# Patient Record
Sex: Female | Born: 1935 | Race: White | Hispanic: No | Marital: Married | Smoking: Never smoker
Health system: Southern US, Community
[De-identification: ages and names within clinical notes are randomized; demographics above are authoritative.]

## PROBLEM LIST (undated history)

## (undated) ENCOUNTER — Emergency Department (HOSPITAL_COMMUNITY): Discharge status: Absent without leave | Payer: Medicare Other

## (undated) DIAGNOSIS — N183 Chronic kidney disease, stage 3 unspecified: Secondary | ICD-10-CM

## (undated) DIAGNOSIS — C50912 Malignant neoplasm of unspecified site of left female breast: Secondary | ICD-10-CM

## (undated) DIAGNOSIS — R911 Solitary pulmonary nodule: Secondary | ICD-10-CM

## (undated) DIAGNOSIS — D472 Monoclonal gammopathy: Secondary | ICD-10-CM

## (undated) DIAGNOSIS — E1165 Type 2 diabetes mellitus with hyperglycemia: Secondary | ICD-10-CM

## (undated) DIAGNOSIS — M4800 Spinal stenosis, site unspecified: Secondary | ICD-10-CM

## (undated) DIAGNOSIS — K449 Diaphragmatic hernia without obstruction or gangrene: Secondary | ICD-10-CM

## (undated) DIAGNOSIS — I679 Cerebrovascular disease, unspecified: Secondary | ICD-10-CM

## (undated) DIAGNOSIS — I10 Essential (primary) hypertension: Secondary | ICD-10-CM

## (undated) DIAGNOSIS — K922 Gastrointestinal hemorrhage, unspecified: Secondary | ICD-10-CM

## (undated) DIAGNOSIS — I639 Cerebral infarction, unspecified: Secondary | ICD-10-CM

## (undated) DIAGNOSIS — M889 Osteitis deformans of unspecified bone: Secondary | ICD-10-CM

## (undated) DIAGNOSIS — D649 Anemia, unspecified: Secondary | ICD-10-CM

## (undated) DIAGNOSIS — M549 Dorsalgia, unspecified: Secondary | ICD-10-CM

## (undated) DIAGNOSIS — D509 Iron deficiency anemia, unspecified: Secondary | ICD-10-CM

## (undated) DIAGNOSIS — I4819 Other persistent atrial fibrillation: Secondary | ICD-10-CM

## (undated) DIAGNOSIS — IMO0002 Reserved for concepts with insufficient information to code with codable children: Secondary | ICD-10-CM

## (undated) DIAGNOSIS — K529 Noninfective gastroenteritis and colitis, unspecified: Secondary | ICD-10-CM

## (undated) HISTORY — PX: HEMORRHOID SURGERY: SHX153

## (undated) HISTORY — DX: Other persistent atrial fibrillation: I48.19

## (undated) HISTORY — PX: PARTIAL HYSTERECTOMY: SHX80

## (undated) HISTORY — DX: Spinal stenosis, site unspecified: M48.00

## (undated) HISTORY — DX: Chronic kidney disease, stage 3 (moderate): N18.3

## (undated) HISTORY — DX: Chronic kidney disease, stage 3 unspecified: N18.30

## (undated) HISTORY — DX: Cerebral infarction, unspecified: I63.9

## (undated) HISTORY — PX: BREAST SURGERY: SHX581

## (undated) HISTORY — PX: CHOLECYSTECTOMY: SHX55

---

## 1997-11-30 ENCOUNTER — Other Ambulatory Visit: Admission: RE | Admit: 1997-11-30 | Discharge: 1997-11-30 | Payer: Self-pay | Admitting: Obstetrics and Gynecology

## 1998-05-18 ENCOUNTER — Ambulatory Visit (HOSPITAL_COMMUNITY): Admission: RE | Admit: 1998-05-18 | Discharge: 1998-05-19 | Payer: Self-pay | Admitting: General Surgery

## 1998-05-18 ENCOUNTER — Encounter: Payer: Self-pay | Admitting: General Surgery

## 2000-12-04 ENCOUNTER — Other Ambulatory Visit: Admission: RE | Admit: 2000-12-04 | Discharge: 2000-12-04 | Payer: Self-pay | Admitting: Obstetrics and Gynecology

## 2001-06-04 ENCOUNTER — Emergency Department (HOSPITAL_COMMUNITY): Admission: EM | Admit: 2001-06-04 | Discharge: 2001-06-04 | Payer: Self-pay | Admitting: Emergency Medicine

## 2001-12-02 ENCOUNTER — Encounter: Admission: RE | Admit: 2001-12-02 | Discharge: 2002-03-02 | Payer: Self-pay | Admitting: Internal Medicine

## 2001-12-07 ENCOUNTER — Other Ambulatory Visit: Admission: RE | Admit: 2001-12-07 | Discharge: 2001-12-07 | Payer: Self-pay | Admitting: Obstetrics and Gynecology

## 2002-01-18 ENCOUNTER — Encounter: Admission: RE | Admit: 2002-01-18 | Discharge: 2002-01-18 | Payer: Self-pay | Admitting: Gastroenterology

## 2002-01-18 ENCOUNTER — Encounter: Payer: Self-pay | Admitting: Gastroenterology

## 2002-12-24 ENCOUNTER — Other Ambulatory Visit: Admission: RE | Admit: 2002-12-24 | Discharge: 2002-12-24 | Payer: Self-pay | Admitting: Obstetrics and Gynecology

## 2005-02-20 ENCOUNTER — Other Ambulatory Visit: Admission: RE | Admit: 2005-02-20 | Discharge: 2005-02-20 | Payer: Self-pay | Admitting: Obstetrics and Gynecology

## 2007-02-26 ENCOUNTER — Emergency Department (HOSPITAL_COMMUNITY): Admission: EM | Admit: 2007-02-26 | Discharge: 2007-02-26 | Payer: Self-pay | Admitting: Emergency Medicine

## 2007-03-12 ENCOUNTER — Emergency Department (HOSPITAL_COMMUNITY): Admission: EM | Admit: 2007-03-12 | Discharge: 2007-03-12 | Payer: Self-pay | Admitting: Emergency Medicine

## 2007-04-22 ENCOUNTER — Ambulatory Visit (HOSPITAL_COMMUNITY): Payer: Self-pay | Admitting: Psychiatry

## 2007-05-20 ENCOUNTER — Ambulatory Visit (HOSPITAL_COMMUNITY): Payer: Self-pay | Admitting: Psychiatry

## 2008-01-22 DIAGNOSIS — C50912 Malignant neoplasm of unspecified site of left female breast: Secondary | ICD-10-CM

## 2008-01-22 HISTORY — DX: Malignant neoplasm of unspecified site of left female breast: C50.912

## 2008-10-24 ENCOUNTER — Observation Stay (HOSPITAL_COMMUNITY): Admission: EM | Admit: 2008-10-24 | Discharge: 2008-10-28 | Payer: Self-pay | Admitting: Emergency Medicine

## 2009-02-15 ENCOUNTER — Inpatient Hospital Stay (HOSPITAL_COMMUNITY): Admission: EM | Admit: 2009-02-15 | Discharge: 2009-02-17 | Payer: Self-pay | Admitting: Emergency Medicine

## 2009-07-27 ENCOUNTER — Other Ambulatory Visit: Admission: RE | Admit: 2009-07-27 | Discharge: 2009-07-27 | Payer: Self-pay | Admitting: Obstetrics and Gynecology

## 2010-04-08 LAB — COMPREHENSIVE METABOLIC PANEL
ALT: 12 U/L (ref 0–35)
AST: 20 U/L (ref 0–37)
Albumin: 3.3 g/dL — ABNORMAL LOW (ref 3.5–5.2)
CO2: 25 mEq/L (ref 19–32)
Chloride: 105 mEq/L (ref 96–112)
Creatinine, Ser: 1.03 mg/dL (ref 0.4–1.2)
GFR calc Af Amer: >60 mL/min (ref >60)
Potassium: 3.1 mEq/L — ABNORMAL LOW (ref 3.5–5.1)
Sodium: 138 mEq/L (ref 135–145)

## 2010-04-08 LAB — RAPID URINE DRUG SCREEN, HOSP PERFORMED
Amphetamines: NOT DETECTED
Barbiturates: NOT DETECTED
Benzodiazepines: NOT DETECTED
Opiates: NOT DETECTED

## 2010-04-08 LAB — BASIC METABOLIC PANEL
BUN: 17 mg/dL (ref 6–23)
GFR calc non Af Amer: 54 mL/min — ABNORMAL LOW (ref >60)
Glucose, Bld: 100 mg/dL — ABNORMAL HIGH (ref 70–99)
Potassium: 3.5 mEq/L (ref 3.5–5.1)

## 2010-04-08 LAB — CBC
MCV: 94.3 fL (ref 78.0–100.0)
Platelets: 129 10*3/uL — ABNORMAL LOW (ref 150–400)
RBC: 3.97 MIL/uL (ref 3.87–5.11)
WBC: 5.8 10*3/uL (ref 4.0–10.5)

## 2010-04-08 LAB — DIFFERENTIAL
Basophils Relative: 0 % (ref 0–1)
Lymphocytes Relative: 13 % (ref 12–46)
Lymphs Abs: 0.7 10*3/uL (ref 0.7–4.0)
Monocytes Relative: 3 % (ref 3–12)
Neutro Abs: 4.9 10*3/uL (ref 1.7–7.7)
Neutrophils Relative %: 83 % — ABNORMAL HIGH (ref 43–77)

## 2010-04-08 LAB — ACETAMINOPHEN LEVEL: Acetaminophen (Tylenol), Serum: <10 ug/mL — ABNORMAL LOW (ref 10–30)

## 2010-04-26 LAB — TSH: TSH: 5.026 u[IU]/mL — ABNORMAL HIGH (ref 0.350–4.500)

## 2010-04-26 LAB — CLOSTRIDIUM DIFFICILE EIA: C difficile Toxins A+B, EIA: NEGATIVE

## 2010-04-26 LAB — FECAL FAT, QUALITATIVE: Neutral Fat: NORMAL

## 2010-04-26 LAB — STOOL CULTURE

## 2010-04-26 LAB — BASIC METABOLIC PANEL
BUN: 19 mg/dL (ref 6–23)
Chloride: 110 mEq/L (ref 96–112)
Creatinine, Ser: 1.16 mg/dL (ref 0.4–1.2)
Glucose, Bld: 91 mg/dL (ref 70–99)

## 2010-04-26 LAB — CBC
HCT: 42.5 % (ref 36.0–46.0)
Hemoglobin: 14.2 g/dL (ref 12.0–15.0)
MCHC: 33.6 g/dL (ref 30.0–36.0)
MCV: 93.9 fL (ref 78.0–100.0)
Platelets: 157 10*3/uL (ref 150–400)
RDW: 13.9 % (ref 11.5–15.5)
RDW: 14 % (ref 11.5–15.5)
WBC: 7.8 10*3/uL (ref 4.0–10.5)

## 2010-04-26 LAB — DIFFERENTIAL
Basophils Absolute: 0.1 10*3/uL (ref 0.0–0.1)
Basophils Relative: 1 % (ref 0–1)
Neutro Abs: 3.6 10*3/uL (ref 1.7–7.7)
Neutrophils Relative %: 62 % (ref 43–77)

## 2010-04-26 LAB — PROTIME-INR
INR: 1 (ref 0.00–1.49)
Prothrombin Time: 13.2 seconds (ref 11.6–15.2)

## 2010-04-26 LAB — COMPREHENSIVE METABOLIC PANEL
Alkaline Phosphatase: 201 U/L — ABNORMAL HIGH (ref 39–117)
BUN: 22 mg/dL (ref 6–23)
CO2: 25 mEq/L (ref 19–32)
Chloride: 105 mEq/L (ref 96–112)
GFR calc non Af Amer: 56 mL/min — ABNORMAL LOW (ref >60)
Glucose, Bld: 94 mg/dL (ref 70–99)
Potassium: 3.2 mEq/L — ABNORMAL LOW (ref 3.5–5.1)
Total Bilirubin: 0.9 mg/dL (ref 0.3–1.2)
Total Protein: 7.4 g/dL (ref 6.0–8.3)

## 2010-04-26 LAB — MAGNESIUM: Magnesium: 2.3 mg/dL (ref 1.5–2.5)

## 2010-04-26 LAB — LIPASE, BLOOD: Lipase: 30 U/L (ref 11–59)

## 2010-04-26 LAB — CK TOTAL AND CKMB (NOT AT ARMC): Total CK: 43 U/L (ref 7–177)

## 2010-10-12 LAB — BASIC METABOLIC PANEL
BUN: 22
CO2: 25
Calcium: 9.3
Chloride: 104
Creatinine, Ser: 1.26 — ABNORMAL HIGH
GFR calc non Af Amer: 42 — ABNORMAL LOW
Glucose, Bld: 144 — ABNORMAL HIGH
Glucose, Bld: 198 — ABNORMAL HIGH
Potassium: 3.4 — ABNORMAL LOW
Sodium: 139
Sodium: 142

## 2010-10-12 LAB — CBC
HCT: 41.8
Hemoglobin: 13.3
Hemoglobin: 12.8
MCHC: 31.8
MCHC: 34.6
MCV: 90.6
Platelets: 198
Platelets: 213
RBC: 4.62
RDW: 13.2
RDW: 14.3
WBC: 9.6

## 2010-10-12 LAB — URINE CULTURE: Colony Count: 55000

## 2010-10-12 LAB — DIFFERENTIAL
Basophils Absolute: 0
Basophils Absolute: 0.1
Basophils Relative: 1
Basophils Relative: 2 — ABNORMAL HIGH
Eosinophils Absolute: 0.2
Eosinophils Relative: 3
Lymphocytes Relative: 19
Lymphocytes Relative: 19
Lymphs Abs: 1.8
Monocytes Absolute: 0.7
Monocytes Relative: 7
Neutro Abs: 6.9
Neutro Abs: 6.3
Neutrophils Relative %: 71
Neutrophils Relative %: 70

## 2010-10-12 LAB — URINE MICROSCOPIC-ADD ON

## 2010-10-12 LAB — URINALYSIS, ROUTINE W REFLEX MICROSCOPIC
Bilirubin Urine: NEGATIVE
Glucose, UA: NEGATIVE
Hgb urine dipstick: NEGATIVE
Ketones, ur: NEGATIVE
Ketones, ur: NEGATIVE
Nitrite: NEGATIVE
Protein, ur: NEGATIVE
Protein, ur: NEGATIVE
Specific Gravity, Urine: 1.017
Urobilinogen, UA: 0.2
Urobilinogen, UA: 0.2
pH: 7

## 2010-10-12 LAB — RAPID URINE DRUG SCREEN, HOSP PERFORMED
Amphetamines: NOT DETECTED
Cocaine: NOT DETECTED
Tetrahydrocannabinol: NOT DETECTED

## 2010-10-12 LAB — ACETAMINOPHEN LEVEL: Acetaminophen (Tylenol), Serum: <10 — ABNORMAL LOW

## 2010-10-12 LAB — SALICYLATE LEVEL: Salicylate Lvl: <4

## 2010-10-12 LAB — POCT CARDIAC MARKERS: Troponin i, poc: <0.05

## 2010-10-12 LAB — BASIC METABOLIC PANEL WITH GFR
BUN: 21
Calcium: 9.2
Creatinine, Ser: 1.13
GFR calc Af Amer: 57 — ABNORMAL LOW
GFR calc non Af Amer: 47 — ABNORMAL LOW

## 2011-07-18 ENCOUNTER — Other Ambulatory Visit: Payer: Self-pay | Admitting: Nurse Practitioner

## 2013-03-25 ENCOUNTER — Other Ambulatory Visit: Payer: Self-pay | Admitting: Nurse Practitioner

## 2013-11-21 DIAGNOSIS — K922 Gastrointestinal hemorrhage, unspecified: Secondary | ICD-10-CM

## 2013-11-21 HISTORY — DX: Gastrointestinal hemorrhage, unspecified: K92.2

## 2013-12-09 ENCOUNTER — Inpatient Hospital Stay (HOSPITAL_COMMUNITY)
Admission: EM | Admit: 2013-12-09 | Discharge: 2013-12-11 | DRG: 378 | Disposition: A | Discharge status: Home / Self Care | Payer: Medicare Other | Attending: Internal Medicine | Admitting: Internal Medicine

## 2013-12-09 ENCOUNTER — Encounter (HOSPITAL_COMMUNITY): Payer: Self-pay

## 2013-12-09 ENCOUNTER — Emergency Department (HOSPITAL_COMMUNITY): Payer: Medicare Other

## 2013-12-09 DIAGNOSIS — M549 Dorsalgia, unspecified: Secondary | ICD-10-CM | POA: Diagnosis present

## 2013-12-09 DIAGNOSIS — R911 Solitary pulmonary nodule: Secondary | ICD-10-CM | POA: Diagnosis present

## 2013-12-09 DIAGNOSIS — D500 Iron deficiency anemia secondary to blood loss (chronic): Secondary | ICD-10-CM | POA: Insufficient documentation

## 2013-12-09 DIAGNOSIS — K5730 Diverticulosis of large intestine without perforation or abscess without bleeding: Secondary | ICD-10-CM | POA: Diagnosis present

## 2013-12-09 DIAGNOSIS — K449 Diaphragmatic hernia without obstruction or gangrene: Secondary | ICD-10-CM | POA: Diagnosis present

## 2013-12-09 DIAGNOSIS — D62 Acute posthemorrhagic anemia: Secondary | ICD-10-CM | POA: Diagnosis present

## 2013-12-09 DIAGNOSIS — K922 Gastrointestinal hemorrhage, unspecified: Principal | ICD-10-CM | POA: Diagnosis present

## 2013-12-09 DIAGNOSIS — I10 Essential (primary) hypertension: Secondary | ICD-10-CM | POA: Diagnosis present

## 2013-12-09 DIAGNOSIS — E119 Type 2 diabetes mellitus without complications: Secondary | ICD-10-CM | POA: Diagnosis present

## 2013-12-09 DIAGNOSIS — K529 Noninfective gastroenteritis and colitis, unspecified: Secondary | ICD-10-CM | POA: Diagnosis present

## 2013-12-09 DIAGNOSIS — G8929 Other chronic pain: Secondary | ICD-10-CM | POA: Diagnosis present

## 2013-12-09 DIAGNOSIS — D649 Anemia, unspecified: Secondary | ICD-10-CM | POA: Diagnosis present

## 2013-12-09 HISTORY — DX: Dorsalgia, unspecified: M54.9

## 2013-12-09 HISTORY — DX: Type 2 diabetes mellitus with hyperglycemia: E11.65

## 2013-12-09 HISTORY — DX: Essential (primary) hypertension: I10

## 2013-12-09 HISTORY — DX: Reserved for concepts with insufficient information to code with codable children: IMO0002

## 2013-12-09 HISTORY — DX: Diaphragmatic hernia without obstruction or gangrene: K44.9

## 2013-12-09 HISTORY — DX: Noninfective gastroenteritis and colitis, unspecified: K52.9

## 2013-12-09 HISTORY — DX: Malignant neoplasm of unspecified site of left female breast: C50.912

## 2013-12-09 LAB — CBC
HEMATOCRIT: 17.7 % — AB (ref 36.0–46.0)
Hemoglobin: 5.1 g/dL — CL (ref 12.0–15.0)
MCH: 21.9 pg — AB (ref 26.0–34.0)
MCHC: 28.8 g/dL — ABNORMAL LOW (ref 30.0–36.0)
MCV: 76 fL — AB (ref 78.0–100.0)
Platelets: 291 10*3/uL (ref 150–400)
RBC: 2.33 MIL/uL — AB (ref 3.87–5.11)
RDW: 18.2 % — ABNORMAL HIGH (ref 11.5–15.5)
WBC: 6.2 10*3/uL (ref 4.0–10.5)

## 2013-12-09 LAB — PREPARE RBC (CROSSMATCH)

## 2013-12-09 LAB — COMPREHENSIVE METABOLIC PANEL
ALT: 10 U/L (ref 0–35)
ANION GAP: 13 (ref 5–15)
AST: 12 U/L (ref 0–37)
Albumin: 3.2 g/dL — ABNORMAL LOW (ref 3.5–5.2)
Alkaline Phosphatase: 104 U/L (ref 39–117)
BUN: 27 mg/dL — AB (ref 6–23)
CALCIUM: 9 mg/dL (ref 8.4–10.5)
CHLORIDE: 102 meq/L (ref 96–112)
CO2: 24 meq/L (ref 19–32)
CREATININE: 1.25 mg/dL — AB (ref 0.50–1.10)
GFR calc Af Amer: 46 mL/min — ABNORMAL LOW (ref >90)
GFR, EST NON AFRICAN AMERICAN: 40 mL/min — AB (ref >90)
Glucose, Bld: 116 mg/dL — ABNORMAL HIGH (ref 70–99)
Potassium: 3.7 mEq/L (ref 3.7–5.3)
Sodium: 139 mEq/L (ref 137–147)
Total Protein: 6.5 g/dL (ref 6.0–8.3)

## 2013-12-09 LAB — I-STAT TROPONIN, ED: TROPONIN I, POC: 0 ng/mL (ref 0.00–0.08)

## 2013-12-09 LAB — ABO/RH: ABO/RH(D): B POS

## 2013-12-09 LAB — GLUCOSE, CAPILLARY: Glucose-Capillary: 132 mg/dL — ABNORMAL HIGH (ref 70–99)

## 2013-12-09 LAB — POC OCCULT BLOOD, ED
FECAL OCCULT BLD: NEGATIVE
Fecal Occult Bld: NEGATIVE

## 2013-12-09 MED ORDER — ONDANSETRON HCL 4 MG PO TABS: 4.0000 mg | ORAL_TABLET | Freq: Four times a day (QID) | ORAL | Status: DC | PRN

## 2013-12-09 MED ORDER — SODIUM CHLORIDE 0.9 % IV SOLN: 10.0000 mL/h | Freq: Once | INTRAVENOUS | Status: AC

## 2013-12-09 MED ORDER — INSULIN ASPART 100 UNIT/ML ~~LOC~~ SOLN
0.0000 [IU] | Freq: Three times a day (TID) | SUBCUTANEOUS | Status: DC
  Administered 2013-12-11: 1 [IU] via SUBCUTANEOUS

## 2013-12-09 MED ORDER — SODIUM CHLORIDE 0.9 % IV SOLN: 10.0000 mL/h | Freq: Once | INTRAVENOUS | Status: DC

## 2013-12-09 MED ORDER — SODIUM CHLORIDE 0.9 % IJ SOLN
3.0000 mL | Freq: Two times a day (BID) | INTRAMUSCULAR | Status: DC
  Administered 2013-12-10 – 2013-12-11 (×2): 3 mL via INTRAVENOUS

## 2013-12-09 MED ORDER — HYDROCODONE-ACETAMINOPHEN 5-325 MG PO TABS: 1.0000 | ORAL_TABLET | ORAL | Status: DC | PRN

## 2013-12-09 MED ORDER — LOSARTAN POTASSIUM 50 MG PO TABS
100.0000 mg | ORAL_TABLET | Freq: Every day | ORAL | Status: DC
  Administered 2013-12-10 – 2013-12-11 (×2): 100 mg via ORAL
  Filled 2013-12-09 (×2): qty 2

## 2013-12-09 MED ORDER — SODIUM CHLORIDE 0.9 % IV SOLN
INTRAVENOUS | Status: AC
  Administered 2013-12-10: 04:00:00 via INTRAVENOUS

## 2013-12-09 MED ORDER — PANTOPRAZOLE SODIUM 40 MG IV SOLR
40.0000 mg | Freq: Two times a day (BID) | INTRAVENOUS | Status: DC
  Administered 2013-12-09 – 2013-12-10 (×2): 40 mg via INTRAVENOUS
  Filled 2013-12-09 (×4): qty 40

## 2013-12-09 MED ORDER — SODIUM CHLORIDE 0.9 % IV SOLN
20.0000 mL | INTRAVENOUS | Status: DC
  Administered 2013-12-09: 20 mL via INTRAVENOUS

## 2013-12-09 MED ORDER — ALBUTEROL SULFATE (2.5 MG/3ML) 0.083% IN NEBU: 2.5000 mg | INHALATION_SOLUTION | RESPIRATORY_TRACT | Status: DC | PRN

## 2013-12-09 MED ORDER — ONDANSETRON HCL 4 MG/2ML IJ SOLN: 4.0000 mg | Freq: Four times a day (QID) | INTRAMUSCULAR | Status: DC | PRN

## 2013-12-09 MED ORDER — ACETAMINOPHEN 650 MG RE SUPP: 650.0000 mg | Freq: Four times a day (QID) | RECTAL | Status: DC | PRN

## 2013-12-09 MED ORDER — ACETAMINOPHEN 325 MG PO TABS: 650.0000 mg | ORAL_TABLET | Freq: Four times a day (QID) | ORAL | Status: DC | PRN

## 2013-12-09 NOTE — H&P (Signed)
History and Physical  Kendra Maldonado VOH:[id number] DOB: September 20, 1933 DOA: 12/09/2013  Referring physician: Dr. Quintella Reichert, EDP PCP: Irven Shelling, MD  Outpatient Specialists:  1. GI: Dr. Teena Irani  Chief Complaint: Dizziness, lightheadedness and black stools.  HPI: Kendra Maldonado is a 80 y.o. female with history of DM 2, HTN, EGD confirmed hiatal hernia, chronic diarrhea post cholecystectomy, back pain-status post recent injection, presented to ED from PCPs office with above complaints. Patient states that she's been having severe mid back pain for which she underwent spinal steroid injection approximately a month ago. Since then she has noticed gradual onset and progressively worsening intermittent dizziness and lightheadedness. The symptoms seem to peak yesterday when she almost passed out but was able to hold onto her came can sit down on a chair. She denies chest pain, dyspnea or strokelike symptoms. She gives one week history of black tarry stools without red blood. She states that she did not make much of it and did not think that it was blood and was more concerned about her relentless back pain. She denies NSAID use. Today she called life alert who took her to PCPs office where hemoglobin was told to be low and she was advised to come to the ED. In the ED, hemoglobin 5.1, MCV 76, BUN*27, creatinine 1.26 and rectal exam by EDP showed empty rectal vault and FOBT negative. She was started on blood transfusion and hospitalist admission was requested. Patient states that she had a negative colonoscopy in 2011.    Review of Systems: All systems reviewed and apart from history of presenting illness, are negative.  Past Medical History  Diagnosis Date  . Diabetes mellitus type 2, uncontrolled   . Essential hypertension   . Chronic diarrhea   . Back pain   . Hiatal hernia    History reviewed. No pertinent past surgical history. Social History:  reports that she has never  smoked. She has never used smokeless tobacco. She reports that she does not drink alcohol or use illicit drugs. Widowed. Lives alone and ambulates with a cane. Her children closely supervise her.  Not on File  Family History  Problem Relation Age of Onset  . Hypertension Mother     Prior to Admission medications   Medication Sig Start Date End Date Taking? Authorizing Provider  CALCIUM PO Take 1 tablet by mouth daily.   Yes Historical Provider, MD  Cholecalciferol (D 2000) 2000 UNITS TABS Take 2,000 Units by mouth daily.   Yes Historical Provider, MD  diphenoxylate-atropine (LOMOTIL) 2.5-0.025 MG per tablet Take 2 tablets by mouth daily as needed for diarrhea or loose stools.  09/20/13  Yes Historical Provider, MD  FLUZONE HIGH-DOSE 0.5 ML SUSY Inject 1 application into the muscle once. 10/26/13  Yes Historical Provider, MD  hydrochlorothiazide (HYDRODIURIL) 25 MG tablet Take 25 mg by mouth daily. 11/10/13  Yes Historical Provider, MD  HYDROcodone-acetaminophen (NORCO/VICODIN) 5-325 MG per tablet Take 1 tablet by mouth every 6 (six) hours as needed. 12/02/13  Yes Historical Provider, MD  losartan (COZAAR) 100 MG tablet Take 100 mg by mouth daily. 10/21/13  Yes Historical Provider, MD  metFORMIN (GLUCOPHAGE) 500 MG tablet Take 500 mg by mouth 2 (two) times daily. 10/07/13  Yes Historical Provider, MD  omeprazole (PRILOSEC) 20 MG capsule Take 20 mg by mouth daily.   Yes Historical Provider, MD   Physical Exam: Filed Vitals:   12/09/13 1700 12/09/13 1715 12/09/13 1730 12/09/13 1744  BP: 145/64 131/57 148/67  Pulse: 85 96 81   Temp:    98.1 F (36.7 C)  TempSrc:    Oral  Resp: 19 21 20    SpO2: 99% 100% 100%      General exam: Moderately built and nourished pleasant elderly female patient, lying comfortably supine on the gurney in no obvious distress. Mucosa pale.   Head, eyes and ENT: Nontraumatic and normocephalic. Pupils equally reacting to light and accommodation. Oral mucosa  moist.  Neck: Supple. No JVD, carotid bruit or thyromegaly.  Lymphatics: No lymphadenopathy.  Respiratory system: Clear to auscultation. No increased work of breathing.  Cardiovascular system: S1 and S2 heard, RRR. No JVD, murmurs, gallops, clicks or pedal edema.  Gastrointestinal system: Abdomen is nondistended, soft and nontender. Normal bowel sounds heard. No organomegaly or masses appreciated.  Central nervous system: Alert and oriented. No focal neurological deficits.  Extremities: Symmetric 5 x 5 power. Peripheral pulses symmetrically felt.   Skin: No rashes or acute findings.  Musculoskeletal system: Negative exam.  Psychiatry: Pleasant and cooperative.   Labs on Admission:  Basic Metabolic Panel:  Recent Labs Lab 12/09/13 1520  NA 139  K 3.7  CL 102  CO2 24  GLUCOSE 116*  BUN 27*  CREATININE 1.25*  CALCIUM 9.0   Liver Function Tests:  Recent Labs Lab 12/09/13 1520  AST 12  ALT 10  ALKPHOS 104  BILITOT <0.2*  PROT 6.5  ALBUMIN 3.2*   No results for input(s): LIPASE, AMYLASE in the last 168 hours. No results for input(s): AMMONIA in the last 168 hours. CBC:  Recent Labs Lab 12/09/13 1520  WBC 6.2  HGB 5.1*  HCT 17.7*  MCV 76.0*  PLT 291   Cardiac Enzymes: No results for input(s): CKTOTAL, CKMB, CKMBINDEX, TROPONINI in the last 168 hours.  BNP (last 3 results) No results for input(s): PROBNP in the last 8760 hours. CBG: No results for input(s): GLUCAP in the last 168 hours.  Radiological Exams on Admission: Dg Chest Portable 1 View  12/09/2013   CLINICAL DATA:  Dizziness.  EXAM: PORTABLE CHEST - 1 VIEW  COMPARISON:  10/24/2008  FINDINGS: Heart size is within normal limits. Both lungs are clear. Possible hiatal hernia noted. Surgical clips again seen in the left axilla.  IMPRESSION: No active cardiopulmonary disease.  Question hiatal hernia.   Electronically Signed   By: Earle Gell M.D.   On: 12/09/2013 16:40    EKG: Independently  reviewed. Sinus rhythm without acute changes   Assessment/Plan Principal Problem:   Acute posthemorrhagic anemia Active Problems:   Upper GI bleed   Back pain   Chronic diarrhea   1. Acute posthemorrhagic anemia: Secondary to upper GI bleed. Transfuse 3 units of PRBCs and follow posttransfusion CBCs. Aim to keep hemoglobin >8 g per DL. Unfortunately transfusion was started before anemia panel was drawn. 2. Upper GI bleed: DD-rule out PUD, gastritis, esophagitis, AVM versus other etiology. Place on clear liquids and nothing by mouth after midnight for EGD in a.m. IV PPI twice a day. No history of NSAID use. [facility]/Dr. Arta Silence has been consulted and upon review of office notes, she has had EGD and colonoscopy in 2011. He agrees with plan of care. Patient is hemodynamically stable.  3. Back pain: Follows with neurosurgery/orthopedics as outpatient and is supposed to have MRI in the next couple of days for further evaluation. Pain management.  4. Hypertension: Controlled. Hold diuretics but continue ARB and monitor closely. If she becomes hypotensive, we'll have to hold all  antihypertensives. 5. Type II DM: Hold oral hypoglycemics and place on NovoLog SSI. 6. Chronic diarrhea: States that she has had this since cholecystectomy in the 80s and is unchanged.    Code Status: Full   Family Communication: Discussed with son at bedside. Disposition Plan: Home when medically stable   Time spent: 70 minutes   Virda Betters, MD, FACP, FHM. [facility] Pager [phone]  If 7PM-7AM, please contact night-coverage [email or web address] Password [email or web address] 12/09/2013, 5:54 PM

## 2013-12-09 NOTE — ED Provider Notes (Signed)
CSN: [id number]     Arrival date & time 12/09/13  1321 History   First MD Initiated Contact with Patient 12/09/13 1336     Chief Complaint  Patient presents with  . Dizziness      HPI Kendra Maldonado presents for progressive intermittent dizziness that spent ongoing for the last month. She had been attributing this pain to her spinal stenosis and recent steroid injection in her back, but when she went to see her PCP today she has told her hemoglobin was low and told to present to the emergency department. She states she's had one week of black stools. She has chronic diarrhea which is unchanged. She denies chest pain, shortness of breath, fevers, or vomiting. She has intermittent lower leg swelling is worse at the end of the day.  She had a colonoscopy several years ago. She denies history of GI bleeding. She has mild lower abdominal pain. Symptoms are mild, intermittent, and worsening. The dizziness is described as a weakness and fatigue. She denies any focal weakness or numbness.  No past medical history on file. History reviewed. No pertinent past surgical history. No family history on file. History  Substance Use Topics  . Smoking status: Never Smoker   . Smokeless tobacco: Not on file  . Alcohol Use: No   OB History    No data available     Review of Systems  All other systems reviewed and are negative.     Allergies  Review of patient's allergies indicates not on file.  Home Medications   Prior to Admission medications   Not on File   BP 141/81 mmHg  Pulse 81  Temp(Src) 97.8 F (36.6 C) (Oral)  Resp 20  SpO2 100% Physical Exam  Constitutional: She is oriented to person, place, and time. She appears well-developed and well-nourished.  HENT:  Head: Normocephalic and atraumatic.  Cardiovascular: Normal rate and regular rhythm.   No murmur heard. Pulmonary/Chest: Effort normal and breath sounds normal. No respiratory distress.  Abdominal: Soft. There is no  tenderness. There is no rebound and no guarding.  External hemorrhoids. Empty rectal vault.  Musculoskeletal: She exhibits no tenderness.  Trace pitting edema in bilateral lower extremities  Neurological: She is alert and oriented to person, place, and time.  Skin: Skin is warm and dry.  Psychiatric: She has a normal mood and affect. Her behavior is normal.  Nursing note and vitals reviewed.   ED Course  Procedures (including critical care time) Labs Review Labs Reviewed  CBC - Abnormal; Notable for the following:    RBC 2.33 (*)    Hemoglobin 5.1 (*)    HCT 17.7 (*)    MCV 76.0 (*)    MCH 21.9 (*)    MCHC 28.8 (*)    RDW 18.2 (*)    All other components within normal limits  COMPREHENSIVE METABOLIC PANEL - Abnormal; Notable for the following:    Glucose, Bld 116 (*)    BUN 27 (*)    Creatinine, Ser 1.25 (*)    Albumin 3.2 (*)    Total Bilirubin <0.2 (*)    GFR calc non Af Amer 40 (*)    GFR calc Af Amer 46 (*)    All other components within normal limits  VITAMIN B12  FOLATE  IRON AND TIBC  FERRITIN  RETICULOCYTES  POC OCCULT BLOOD, ED  I-STAT TROPOININ, ED  POC OCCULT BLOOD, ED  TYPE AND SCREEN  ABO/RH  PREPARE RBC (CROSSMATCH)  PREPARE RBC (  CROSSMATCH)    Imaging Review Dg Chest Portable 1 View  12/09/2013   CLINICAL DATA:  Dizziness.  EXAM: PORTABLE CHEST - 1 VIEW  COMPARISON:  10/24/2008  FINDINGS: Heart size is within normal limits. Both lungs are clear. Possible hiatal hernia noted. Surgical clips again seen in the left axilla.  IMPRESSION: No active cardiopulmonary disease.  Question hiatal hernia.   Electronically Signed   By: Earle Gell M.D.   On: 12/09/2013 16:40     EKG Interpretation   Date/Time:  Thursday December 09 2013 13:25:41 EST Ventricular Rate:  88 PR Interval:  160 QRS Duration: 104 QT Interval:  379 QTC Calculation: 458 R Axis:   37 Text Interpretation:  Sinus rhythm Low voltage, precordial leads Confirmed  by Hazle Coca [phone])  on 12/09/2013 2:28:36 PM      MDM   Final diagnoses:  Symptomatic anemia    Patient here with progressive dizziness/fatigue, happens to have symptomatic anemia.  Hemoccult stool was negative but rectal vault was empty, question rectal bleeding with melanotic stools per patient report.  Patient transfused 2 units PRBC for synthetic anemia. Discussed with Dr. Algis Liming, who will admit the patient.     Quintella Reichert, MD 12/09/13 [phone]

## 2013-12-09 NOTE — ED Notes (Signed)
Pt. Reports having intermittent dizziness since October .  Pt,. Woke up this morning with dizziness.   Pt. Denies any pain or sob.  She was at [facility] and the sent her to [place] due to a low H & H. Hgb 5.5 / Hem 18.6.   Pt. Reports dark stools.   Skin is p/w/d.    Pt. Is alert and oriented X4.

## 2013-12-09 NOTE — ED Notes (Signed)
Report attempted 

## 2013-12-09 NOTE — ED Notes (Signed)
Patient cleaned and placed into dry gown prior to transport.

## 2013-12-09 NOTE — ED Notes (Addendum)
Spoke with admitting MD on call to add 3rd unit of blood that Dr. Algis Liming states he was going to order and has not ordered. States will order. Receiving RN made aware.

## 2013-12-10 ENCOUNTER — Encounter (HOSPITAL_COMMUNITY)
Admission: EM | Disposition: A | Discharge status: Home / Self Care | Payer: Medicare Other | Source: Home / Self Care | Attending: Internal Medicine

## 2013-12-10 ENCOUNTER — Inpatient Hospital Stay (HOSPITAL_COMMUNITY): Payer: Medicare Other | Admitting: Anesthesiology

## 2013-12-10 ENCOUNTER — Encounter (HOSPITAL_COMMUNITY): Payer: Self-pay

## 2013-12-10 ENCOUNTER — Inpatient Hospital Stay (HOSPITAL_COMMUNITY): Payer: Medicare Other

## 2013-12-10 DIAGNOSIS — D649 Anemia, unspecified: Secondary | ICD-10-CM

## 2013-12-10 HISTORY — PX: ESOPHAGOGASTRODUODENOSCOPY (EGD) WITH PROPOFOL: SHX5813

## 2013-12-10 LAB — BASIC METABOLIC PANEL
Anion gap: 16 — ABNORMAL HIGH (ref 5–15)
BUN: 18 mg/dL (ref 6–23)
CO2: 19 mEq/L (ref 19–32)
Calcium: 9 mg/dL (ref 8.4–10.5)
Chloride: 106 mEq/L (ref 96–112)
Creatinine, Ser: 1.17 mg/dL — ABNORMAL HIGH (ref 0.50–1.10)
GFR calc Af Amer: 50 mL/min — ABNORMAL LOW (ref >90)
GFR, EST NON AFRICAN AMERICAN: 43 mL/min — AB (ref >90)
Glucose, Bld: 120 mg/dL — ABNORMAL HIGH (ref 70–99)
Potassium: 3.7 mEq/L (ref 3.7–5.3)
SODIUM: 141 meq/L (ref 137–147)

## 2013-12-10 LAB — FOLATE

## 2013-12-10 LAB — CBC
HCT: 27.9 % — ABNORMAL LOW (ref 36.0–46.0)
HCT: 29.1 % — ABNORMAL LOW (ref 36.0–46.0)
HEMATOCRIT: 29.1 % — AB (ref 36.0–46.0)
Hemoglobin: 8.7 g/dL — ABNORMAL LOW (ref 12.0–15.0)
Hemoglobin: 9 g/dL — ABNORMAL LOW (ref 12.0–15.0)
Hemoglobin: 8.9 g/dL — ABNORMAL LOW (ref 12.0–15.0)
MCH: 24 pg — AB (ref 26.0–34.0)
MCH: 23.6 pg — ABNORMAL LOW (ref 26.0–34.0)
MCH: 23.1 pg — ABNORMAL LOW (ref 26.0–34.0)
MCHC: 31.2 g/dL (ref 30.0–36.0)
MCHC: 30.9 g/dL (ref 30.0–36.0)
MCHC: 30.6 g/dL (ref 30.0–36.0)
MCV: 77.1 fL — ABNORMAL LOW (ref 78.0–100.0)
MCV: 76.2 fL — ABNORMAL LOW (ref 78.0–100.0)
MCV: 75.6 fL — ABNORMAL LOW (ref 78.0–100.0)
PLATELETS: 263 10*3/uL (ref 150–400)
PLATELETS: 251 10*3/uL (ref 150–400)
Platelets: 287 10*3/uL (ref 150–400)
RBC: 3.62 MIL/uL — ABNORMAL LOW (ref 3.87–5.11)
RBC: 3.82 MIL/uL — ABNORMAL LOW (ref 3.87–5.11)
RBC: 3.85 MIL/uL — ABNORMAL LOW (ref 3.87–5.11)
RDW: 18 % — ABNORMAL HIGH (ref 11.5–15.5)
RDW: 17.9 % — ABNORMAL HIGH (ref 11.5–15.5)
RDW: 18.2 % — AB (ref 11.5–15.5)
WBC: 6.7 10*3/uL (ref 4.0–10.5)
WBC: 7.4 10*3/uL (ref 4.0–10.5)
WBC: 7.3 10*3/uL (ref 4.0–10.5)

## 2013-12-10 LAB — PROTIME-INR
INR: 1.07 (ref 0.00–1.49)
PROTHROMBIN TIME: 14 s (ref 11.6–15.2)

## 2013-12-10 LAB — IRON AND TIBC
IRON: 380 ug/dL — AB (ref 42–135)
UIBC: <15 ug/dL — ABNORMAL LOW (ref 125–400)

## 2013-12-10 LAB — MRSA PCR SCREENING: MRSA by PCR: NEGATIVE

## 2013-12-10 LAB — VITAMIN B12: Vitamin B-12: 292 pg/mL (ref 211–911)

## 2013-12-10 LAB — GLUCOSE, CAPILLARY
GLUCOSE-CAPILLARY: 118 mg/dL — AB (ref 70–99)
GLUCOSE-CAPILLARY: 97 mg/dL (ref 70–99)
Glucose-Capillary: 118 mg/dL — ABNORMAL HIGH (ref 70–99)

## 2013-12-10 LAB — RETICULOCYTES
RBC.: 3.62 MIL/uL — AB (ref 3.87–5.11)
Retic Count, Absolute: 65.2 10*3/uL (ref 19.0–186.0)
Retic Ct Pct: 1.8 % (ref 0.4–3.1)

## 2013-12-10 LAB — FERRITIN: FERRITIN: 13 ng/mL (ref 10–291)

## 2013-12-10 SURGERY — ESOPHAGOGASTRODUODENOSCOPY (EGD) WITH PROPOFOL
Anesthesia: Monitor Anesthesia Care

## 2013-12-10 MED ORDER — ONDANSETRON HCL 4 MG/2ML IJ SOLN
INTRAMUSCULAR | Status: DC | PRN
  Administered 2013-12-10: 4 mg via INTRAVENOUS

## 2013-12-10 MED ORDER — OXYCODONE HCL 5 MG/5ML PO SOLN: 5.0000 mg | Freq: Once | ORAL | Status: DC | PRN

## 2013-12-10 MED ORDER — OXYCODONE HCL 5 MG PO TABS: 5.0000 mg | ORAL_TABLET | Freq: Once | ORAL | Status: DC | PRN

## 2013-12-10 MED ORDER — FENTANYL CITRATE 0.05 MG/ML IJ SOLN: 25.0000 ug | INTRAMUSCULAR | Status: DC | PRN

## 2013-12-10 MED ORDER — SODIUM CHLORIDE 0.9 % IV SOLN: INTRAVENOUS | Status: DC

## 2013-12-10 MED ORDER — IOHEXOL 300 MG/ML  SOLN
25.0000 mL | INTRAMUSCULAR | Status: AC
  Administered 2013-12-10: 25 mL via ORAL

## 2013-12-10 MED ORDER — BUTAMBEN-TETRACAINE-BENZOCAINE 2-2-14 % EX AERO
INHALATION_SPRAY | CUTANEOUS | Status: DC | PRN
  Administered 2013-12-10: 2 via TOPICAL

## 2013-12-10 MED ORDER — ONDANSETRON HCL 4 MG/2ML IJ SOLN: 4.0000 mg | Freq: Four times a day (QID) | INTRAMUSCULAR | Status: DC | PRN

## 2013-12-10 MED ORDER — PROPOFOL 10 MG/ML IV BOLUS
INTRAVENOUS | Status: DC | PRN
  Administered 2013-12-10: 20 mg via INTRAVENOUS

## 2013-12-10 MED ORDER — PANTOPRAZOLE SODIUM 40 MG PO TBEC
40.0000 mg | DELAYED_RELEASE_TABLET | Freq: Every day | ORAL | Status: DC
  Administered 2013-12-11: 40 mg via ORAL
  Filled 2013-12-10: qty 1

## 2013-12-10 MED ORDER — PROPOFOL INFUSION 10 MG/ML OPTIME
INTRAVENOUS | Status: DC | PRN
  Administered 2013-12-10: 100 ug/kg/min via INTRAVENOUS

## 2013-12-10 MED ORDER — LACTATED RINGERS IV SOLN
INTRAVENOUS | Status: DC | PRN
  Administered 2013-12-10: 11:00:00 via INTRAVENOUS

## 2013-12-10 MED ORDER — IOHEXOL 300 MG/ML  SOLN
75.0000 mL | Freq: Once | INTRAMUSCULAR | Status: AC | PRN
  Administered 2013-12-10: 75 mL via INTRAVENOUS

## 2013-12-10 NOTE — Transfer of Care (Signed)
Immediate Anesthesia Transfer of Care Note  Patient: Kendra Maldonado  Procedure(s) Performed: Procedure(s): ESOPHAGOGASTRODUODENOSCOPY (EGD) WITH PROPOFOL (N/A)  Patient Location: Endoscopy Unit  Anesthesia Type:MAC  Level of Consciousness: awake, alert  and oriented  Airway & Oxygen Therapy: Patient Spontanous Breathing and Patient connected to nasal cannula oxygen  Post-op Assessment: Report given to PACU RN and Post -op Vital signs reviewed and stable  Post vital signs: Reviewed and stable  Complications: No apparent anesthesia complications

## 2013-12-10 NOTE — Anesthesia Preprocedure Evaluation (Signed)
Anesthesia Evaluation  Patient identified by MRN, date of birth, ID band Patient awake    Reviewed: Allergy & Precautions, H&P , NPO status , Patient's Chart, lab work & pertinent test results  Airway Mallampati: II   Neck ROM: full    Dental   Pulmonary neg pulmonary ROS,          Cardiovascular hypertension,     Neuro/Psych  Neuromuscular disease    GI/Hepatic hiatal hernia,   Endo/Other  diabetes, Type 2  Renal/GU      Musculoskeletal   Abdominal   Peds  Hematology   Anesthesia Other Findings   Reproductive/Obstetrics                             Anesthesia Physical Anesthesia Plan  ASA: II  Anesthesia Plan: MAC   Post-op Pain Management:    Induction: Intravenous  Airway Management Planned: Nasal Cannula  Additional Equipment:   Intra-op Plan:   Post-operative Plan:   Informed Consent: I have reviewed the patients History and Physical, chart, labs and discussed the procedure including the risks, benefits and alternatives for the proposed anesthesia with the patient or authorized representative who has indicated his/her understanding and acceptance.     Plan Discussed with: CRNA, Anesthesiologist and Surgeon  Anesthesia Plan Comments:         Anesthesia Quick Evaluation

## 2013-12-10 NOTE — Progress Notes (Signed)
Utilization Review Completed.Donne Anon T11/20/2015

## 2013-12-10 NOTE — Anesthesia Postprocedure Evaluation (Signed)
  Anesthesia Post-op Note  Patient: Kendra Maldonado  Procedure(s) Performed: Procedure(s): ESOPHAGOGASTRODUODENOSCOPY (EGD) WITH PROPOFOL (N/A)  Patient Location: PACU  Anesthesia Type:MAC  Level of Consciousness: awake and alert   Airway and Oxygen Therapy: Patient Spontanous Breathing  Post-op Pain: none  Post-op Assessment: Post-op Vital signs reviewed  Post-op Vital Signs: Reviewed  Last Vitals:  Filed Vitals:   12/10/13 1140  BP: 174/74  Pulse: 75  Temp:   Resp: 20    Complications: No apparent anesthesia complications

## 2013-12-10 NOTE — Consult Note (Signed)
Reason for Consult: Symptomatic anemia and melena Referring Physician: Hospital team  Kendra Maldonado is an 80 y.o. female.  HPI: Patient familiar to my partner Dr. Amedeo Plenty with a colonoscopy and endoscopy in 2011 who has had diarrhea since her gallbladder was out helped by Imodium who has been on Naprosyn for her back and had some melena and was found to have a hemoglobin of 5.1 and we are consulted for further workup and plans and she has no upper tract symptoms and her family history is negative for any previous GI issues and her back is her main complaint in fact is supposed to have an MRI tomorrow as an outpatient  Past Medical History  Diagnosis Date  . Diabetes mellitus type 2, uncontrolled   . Essential hypertension   . Chronic diarrhea   . Back pain   . Hiatal hernia     Stenosis of the spine    History reviewed. No pertinent past surgical history.  Family History  Problem Relation Age of Onset  . Hypertension Mother     Social History:  reports that she has never smoked. She has never used smokeless tobacco. She reports that she does not drink alcohol or use illicit drugs.  Allergies:  Allergies  Allergen Reactions  . Codeine Rash  . Demerol [Meperidine] Rash    Medications: I have reviewed the patient's current medications.  Results for orders placed or performed during the hospital encounter of 12/09/13 (from the past 48 hour(s))  POC occult blood, ED     Status: None   Collection Time: 12/09/13  2:10 PM  Result Value Ref Range   Fecal Occult Bld NEGATIVE NEGATIVE  CBC     Status: Abnormal   Collection Time: 12/09/13  3:20 PM  Result Value Ref Range   WBC 6.2 4.0 - 10.5 K/uL   RBC 2.33 (L) 3.87 - 5.11 MIL/uL   Hemoglobin 5.1 (LL) 12.0 - 15.0 g/dL    Comment: REPEATED TO VERIFY CRITICAL RESULT CALLED TO, READ BACK BY AND VERIFIED WITH: YUAL,K RN @ 1558 12/09/13 LEONARD,A    HCT 17.7 (L) 36.0 - 46.0 %   MCV 76.0 (L) 78.0 - 100.0 fL   MCH 21.9 (L) 26.0 - 34.0  pg   MCHC 28.8 (L) 30.0 - 36.0 g/dL   RDW 18.2 (H) 11.5 - 15.5 %   Platelets 291 150 - 400 K/uL  Comprehensive metabolic panel     Status: Abnormal   Collection Time: 12/09/13  3:20 PM  Result Value Ref Range   Sodium 139 137 - 147 mEq/L   Potassium 3.7 3.7 - 5.3 mEq/L   Chloride 102 96 - 112 mEq/L   CO2 24 19 - 32 mEq/L   Glucose, Bld 116 (H) 70 - 99 mg/dL   BUN 27 (H) 6 - 23 mg/dL   Creatinine, Ser 1.25 (H) 0.50 - 1.10 mg/dL   Calcium 9.0 8.4 - 10.5 mg/dL   Total Protein 6.5 6.0 - 8.3 g/dL   Albumin 3.2 (L) 3.5 - 5.2 g/dL   AST 12 0 - 37 U/L   ALT 10 0 - 35 U/L   Alkaline Phosphatase 104 39 - 117 U/L   Total Bilirubin <0.2 (L) 0.3 - 1.2 mg/dL   GFR calc non Af Amer 40 (L) >90 mL/min   GFR calc Af Amer 46 (L) >90 mL/min    Comment: (NOTE) The eGFR has been calculated using the CKD EPI equation. This calculation has not been validated in  all clinical situations. eGFR's persistently <90 mL/min signify possible Chronic Kidney Disease.    Anion gap 13 5 - 15  Type and screen     Status: None (Preliminary result)   Collection Time: 12/09/13  3:20 PM  Result Value Ref Range   ABO/RH(D) B POS    Antibody Screen NEG    Sample Expiration 12/12/2013    Unit Number [id number]    Blood Component Type RED CELLS,LR    Unit division 00    Status of Unit ISSUED,FINAL    Transfusion Status OK TO TRANSFUSE    Crossmatch Result Compatible    Unit Number [id number]    Blood Component Type RED CELLS,LR    Unit division 00    Status of Unit ISSUED,FINAL    Transfusion Status OK TO TRANSFUSE    Crossmatch Result Compatible    Unit Number [id number]    Blood Component Type RED CELLS,LR    Unit division 00    Status of Unit ISSUED    Transfusion Status OK TO TRANSFUSE    Crossmatch Result Compatible   ABO/Rh     Status: None   Collection Time: 12/09/13  3:20 PM  Result Value Ref Range   ABO/RH(D) B POS   I-stat troponin, ED     Status: None   Collection Time: 12/09/13   3:45 PM  Result Value Ref Range   Troponin i, poc 0.00 0.00 - 0.08 ng/mL   Comment 3            Comment: Due to the release kinetics of cTnI, a negative result within the first hours of the onset of symptoms does not rule out myocardial infarction with certainty. If myocardial infarction is still suspected, repeat the test at appropriate intervals.   POC occult blood, ED     Status: None   Collection Time: 12/09/13  4:17 PM  Result Value Ref Range   Fecal Occult Bld NEGATIVE NEGATIVE  Prepare RBC     Status: None   Collection Time: 12/09/13  4:30 PM  Result Value Ref Range   Order Confirmation ORDER PROCESSED BY BLOOD BANK   Prepare RBC     Status: None   Collection Time: 12/09/13  5:30 PM  Result Value Ref Range   Order Confirmation ORDER PROCESSED BY BLOOD BANK   Glucose, capillary     Status: Abnormal   Collection Time: 12/09/13  9:27 PM  Result Value Ref Range   Glucose-Capillary 132 (H) 70 - 99 mg/dL   Comment 1 Documented in Chart    Comment 2 Notify RN   CBC     Status: Abnormal   Collection Time: 12/10/13  5:08 AM  Result Value Ref Range   WBC 6.7 4.0 - 10.5 K/uL   RBC 3.62 (L) 3.87 - 5.11 MIL/uL   Hemoglobin 8.7 (L) 12.0 - 15.0 g/dL    Comment: REPEATED TO VERIFY POST TRANSFUSION SPECIMEN    HCT 27.9 (L) 36.0 - 46.0 %   MCV 77.1 (L) 78.0 - 100.0 fL   MCH 24.0 (L) 26.0 - 34.0 pg   MCHC 31.2 30.0 - 36.0 g/dL   RDW 18.0 (H) 11.5 - 15.5 %   Platelets 263 150 - 400 K/uL  Protime-INR     Status: None   Collection Time: 12/10/13  5:08 AM  Result Value Ref Range   Prothrombin Time 14.0 11.6 - 15.2 seconds   INR 1.07 0.00 - 1.49  Reticulocytes     Status: Abnormal  Collection Time: 12/10/13  5:08 AM  Result Value Ref Range   Retic Ct Pct 1.8 0.4 - 3.1 %   RBC. 3.62 (L) 3.87 - 5.11 MIL/uL   Retic Count, Manual 65.2 19.0 - 186.0 K/uL  MRSA PCR Screening     Status: None   Collection Time: 12/10/13  6:28 AM  Result Value Ref Range   MRSA by PCR NEGATIVE  NEGATIVE    Comment:        The GeneXpert MRSA Assay (FDA approved for NASAL specimens only), is one component of a comprehensive MRSA colonization surveillance program. It is not intended to diagnose MRSA infection nor to guide or monitor treatment for MRSA infections.   Glucose, capillary     Status: Abnormal   Collection Time: 12/10/13  8:29 AM  Result Value Ref Range   Glucose-Capillary 118 (H) 70 - 99 mg/dL    Dg Chest Portable 1 View  12/09/2013   CLINICAL DATA:  Dizziness.  EXAM: PORTABLE CHEST - 1 VIEW  COMPARISON:  10/24/2008  FINDINGS: Heart size is within normal limits. Both lungs are clear. Possible hiatal hernia noted. Surgical clips again seen in the left axilla.  IMPRESSION: No active cardiopulmonary disease.  Question hiatal hernia.   Electronically Signed   By: Earle Gell M.D.   On: 12/09/2013 16:40    ROS negative except above Blood pressure 179/62, pulse 76, temperature 98.1 F (36.7 C), temperature source Oral, resp. rate 24, height 5' 7"  (1.702 m), weight 79.2 kg (174 lb 9.7 oz), SpO2 97 %. Physical Exam vital signs stable afebrile no acute distress exam please see preassessment evaluation labs reviewed previous workup reviewed  Assessment/Plan: Anemia in a patient with nonsteroidal use Plan: The risks benefits methods of endoscopy was discussed and will proceed this morning with further workup and plans pending those findings  Yaqub Arney E 12/10/2013, 10:41 AM

## 2013-12-10 NOTE — Progress Notes (Signed)
TEAM 1 - Stepdown/ICU TEAM Progress Note  Kendra Maldonado IOE:[id number] DOB: 1933-04-29 DOA: 12/09/2013 PCP: Irven Shelling, MD  Admit HPI / Brief Narrative: HPI: Kendra Maldonado is a 80 y.o. female with history of DM 2, HTN, EGD confirmed hiatal hernia, chronic diarrhea post cholecystectomy, back pain-status post recent injection, presented to ED from PCPs office with above complaints. Patient states that she's been having severe mid back pain for which she underwent spinal steroid injection approximately a month ago. Since then she has noticed gradual onset and progressively worsening intermittent dizziness and lightheadedness. The symptoms seem to peak yesterday when she almost passed out but was able to hold onto her came can sit down on a chair. She denies chest pain, dyspnea or strokelike symptoms. She gives one week history of black tarry stools without red blood. She states that she did not make much of it and did not think that it was blood and was more concerned about her relentless back pain. She denies NSAID use. Today she called life alert who took her to PCPs office where hemoglobin was told to be low and she was advised to come to the ED. In the ED, hemoglobin 5.1, MCV 76, BUN*27, creatinine 1.26 and rectal exam by EDP showed empty rectal vault and FOBT negative. She was started on blood transfusion and hospitalist admission was requested. Patient states that she had a negative colonoscopy in 2011.   HPI/Subjective: Patient with back pain this morning and continued melena with difficulty holding her stool.  She confirms no recent NSAID use (1 dose of aleve a few days ago).  She has had multiple steroid injections in her back, ? If this could cause a problem.  She does not report dyspepsia today.   Assessment/Plan: Acute posthemorrhagic anemia:  - Has received 3 units of blood, post Hgb 8.7 - Per H&P, due for upper GI scope this morning, no notes from GI as of  yet  Upper GI bleed:  - DDx:  PUD, gastritis, esophagitis, AVM versus other etiology.  - NPO for EGD this AM - IV PPI twice a day.  - Patient is hemodynamically stable this morning  Back pain:  - Follows with neurosurgery/orthopedics and is supposed to have MRI tomorrow per her - On Norco for pain  Hypertension:  - Started to show elevation this morning - Losartan ordered - Home HCTZ held - Once EGD complete, consider restarting home medications.   Type II DM:  - Hold oral hypoglycemics  - NovoLog SSI. - CBGs range from 118 - 132  Chronic diarrhea:  - No change, chronic since cholecystectomy.     Code Status: FULL Family Communication: no family present at time of exam Disposition Plan: Pending further work up of GIB    Consultants: GI  Procedure/Significant Events:    Culture MRSA PCR negative  Antibiotics: none  DVT prophylaxis: SCDs due to GIB   Devices    LINES / TUBES:  PIV    Continuous Infusions: . sodium chloride 20 mL (12/09/13 1619)  . sodium chloride 50 mL/hr at 12/10/13 0419    Objective: VITAL SIGNS: Temp: 98.2 F (36.8 C) (11/20 0416) Temp Source: Oral (11/20 0416) BP: 130/84 mmHg (11/20 0416) Pulse Rate: 78 (11/20 0416) SPO2; FIO2:   Intake/Output Summary (Last 24 hours) at 12/10/13 0836 Last data filed at 12/10/13 0600  Gross per 24 hour  Intake   1190 ml  Output      0 ml  Net  1190 ml     Exam: General: No acute respiratory distress, speaking in full sentences, answering questions appropriately.  Lungs: Clear to auscultation bilaterally without wheezes or crackles Cardiovascular: Regular rate and rhythm without murmur gallop or rub normal S1 and S2 Abdomen: Nontender, nondistended, soft, bowel sounds positive, no rebound Extremities: No significant cyanosis, clubbing, or edema bilateral lower extremities  Data Reviewed: Basic Metabolic Panel:  Recent Labs Lab 12/09/13 1520  NA 139  K 3.7  CL 102  CO2  24  GLUCOSE 116*  BUN 27*  CREATININE 1.25*  CALCIUM 9.0   Liver Function Tests:  Recent Labs Lab 12/09/13 1520  AST 12  ALT 10  ALKPHOS 104  BILITOT <0.2*  PROT 6.5  ALBUMIN 3.2*   No results for input(s): LIPASE, AMYLASE in the last 168 hours. No results for input(s): AMMONIA in the last 168 hours. CBC:  Recent Labs Lab 12/09/13 1520 12/10/13 0508  WBC 6.2 6.7  HGB 5.1* 8.7*  HCT 17.7* 27.9*  MCV 76.0* 77.1*  PLT 291 263   Cardiac Enzymes: No results for input(s): CKTOTAL, CKMB, CKMBINDEX, TROPONINI in the last 168 hours. BNP (last 3 results) No results for input(s): PROBNP in the last 8760 hours. CBG:  Recent Labs Lab 12/09/13 2127 12/10/13 0829  GLUCAP 132* 118*    No results found for this or any previous visit (from the past 240 hour(s)).   Studies:  Recent x-ray studies have been reviewed in detail by the Attending Physician  Scheduled Meds:  Scheduled Meds: . sodium chloride  10 mL/hr Intravenous Once  . insulin aspart  0-9 Units Subcutaneous TID WC  . losartan  100 mg Oral Daily  . pantoprazole (PROTONIX) IV  40 mg Intravenous Q12H  . sodium chloride  3 mL Intravenous Q12H    Time spent on care of this patient: 40 mins   Gilles Chiquito , MD   [facility] Office  [phone] Pager - [phone]  On-Call/Text Page:      [email or web address]      password [email or web address]  If 7PM-7AM, please contact night-coverage [email or web address] Password [email or web address] 12/10/2013, 8:36 AM   LOS: 1 day

## 2013-12-10 NOTE — Op Note (Addendum)
[facility] [place], [id number]   ENDOSCOPY PROCEDURE REPORT  PATIENT: Kendra Maldonado, Kendra Maldonado  MR#: [id number] BIRTHDATE: [birth year] , 80  yrs. old GENDER: female ENDOSCOPIST: Clarene Essex, MD REFERRED BY:  Lavone Orn, M.D. PROCEDURE DATE:  12/10/2013 PROCEDURE:  EGD w/ biopsy ASA CLASS:     Class II INDICATIONS:  acute post hemorrhagic anemia. MEDICATIONS: Propofol 150 mg IV TOPICAL ANESTHETIC: Cetacaine Spray  DESCRIPTION OF PROCEDURE: After the risks benefits and alternatives of the procedure were thoroughly explained, informed consent was obtained.  The Pentax Gastroscope [id number] endoscope was introduced through the mouth and advanced to the second portion of the duodenum , Without limitations.  The instrument was slowly withdrawn as the mucosa was fully examined.    The findings are recorded below       Retroflexed views revealed a hiatal hernia.     The scope was then withdrawn from the patient and the procedure completed.the patient tolerated the procedure well there was no obvious immediate complication  COMPLICATIONS: There were no immediate complications.  ENDOSCOPIC IMPRESSION: 1. Tortuous distal esophagus with moderate-sized hiatal hernia and one small Cameron erosion 2. Multiple tiny gastric polyps a few biopsied 3. Otherwise within normal limits EGD  RECOMMENDATIONS: will proceed with a CT just to be sure and if negative consider inpatient versus outpatient colonoscopy    recommend pump inhibitors and iron to start after colonoscopy if needed  REPEAT EXAM: as needed  eSigned:  Clarene Essex, MD 12/10/2013 11:26 AM Revised: 12/10/2013 11:26 AM   ZN:BVAP Laurann Montana, MD  CPT CODES: ICD CODES:  The ICD and CPT codes recommended by this software are interpretations from the data that the clinical staff has captured with the software.  The verification of the translation of this report to the ICD and CPT codes and modifiers  is the sole responsibility of the health care institution and practicing physician where this report was generated.  [employer] will not be held responsible for the validity of the ICD and CPT codes included on this report.  AMA assumes no liability for data contained or not contained herein. CPT is a Designer, television/film set of the [employer].  PATIENT NAME:  Catalena, Stanhope MR#: [id number]

## 2013-12-11 DIAGNOSIS — D500 Iron deficiency anemia secondary to blood loss (chronic): Secondary | ICD-10-CM | POA: Insufficient documentation

## 2013-12-11 LAB — GLUCOSE, CAPILLARY
Glucose-Capillary: 150 mg/dL — ABNORMAL HIGH (ref 70–99)
Glucose-Capillary: 120 mg/dL — ABNORMAL HIGH (ref 70–99)

## 2013-12-11 LAB — CBC
HCT: 28.1 % — ABNORMAL LOW (ref 36.0–46.0)
Hemoglobin: 8.8 g/dL — ABNORMAL LOW (ref 12.0–15.0)
MCH: 24.2 pg — AB (ref 26.0–34.0)
MCHC: 31.3 g/dL (ref 30.0–36.0)
MCV: 77.2 fL — ABNORMAL LOW (ref 78.0–100.0)
PLATELETS: 264 10*3/uL (ref 150–400)
RBC: 3.64 MIL/uL — ABNORMAL LOW (ref 3.87–5.11)
RDW: 18.1 % — AB (ref 11.5–15.5)
WBC: 7.1 10*3/uL (ref 4.0–10.5)

## 2013-12-11 LAB — TYPE AND SCREEN
ABO/RH(D): B POS
Antibody Screen: NEGATIVE
UNIT DIVISION: 0
Unit division: 0
Unit division: 0

## 2013-12-11 MED ORDER — FERROUS SULFATE 325 (65 FE) MG PO TABS: 325.0000 mg | ORAL_TABLET | Freq: Every day | ORAL | Status: DC

## 2013-12-11 MED ORDER — DOCUSATE SODIUM 100 MG PO CAPS
100.0000 mg | ORAL_CAPSULE | Freq: Two times a day (BID) | ORAL | Status: DC
  Filled 2013-12-11: qty 1

## 2013-12-11 MED ORDER — FERROUS SULFATE 325 (65 FE) MG PO TABS
325.0000 mg | ORAL_TABLET | Freq: Three times a day (TID) | ORAL | Status: DC
  Administered 2013-12-11: 325 mg via ORAL
  Filled 2013-12-11 (×3): qty 1

## 2013-12-11 NOTE — Discharge Instructions (Addendum)
Ms. Uram, you were admitted with bleeding from your GI tract.  You had a endoscopy of your stomach and a CT scan which did not show any specific GI pathology.  You will go home on a proton pump inhibitor (acid reducer) that you should take every day.  You will also go home on once a day iron to take until you see Dr. Watt Climes in the clinic.  You should see Dr. Watt Climes in the clinic in 2 weeks from your discharge.  If you do not hear from the office by the middle of next week, please call them yourself.   If you have any further dark stools, dizziness, pass out or chest pain, please call your primary doctor or come back to the hospital.  If you develop severe nausea, headache, vomiting, diarrhea, constipation, fever > 101.30F, severe pain, please call your doctor or come to the hospital.    Iron may cause constipation.  You have chronic diarrhea normally, but if you notice that your stools slow down, please take a stool softener (colace) as needed.

## 2013-12-11 NOTE — Progress Notes (Signed)
[facility] TEAM 1 - Stepdown/ICU TEAM Progress Note  Kendra Maldonado NKN:[id number] DOB: 1933/05/12 DOA: 12/09/2013 PCP: Irven Shelling, MD  Admit HPI / Brief Narrative: HPI: Kendra Maldonado is a 80 y.o. female with history of DM 2, HTN, EGD confirmed hiatal hernia, chronic diarrhea post cholecystectomy, back pain-status post recent injection, presented to ED from PCPs office with above complaints. Back pain started about 1 month ago after which she noticed gradual onset and progressively worsening intermittent dizziness and lightheadedness. The symptoms seemed to peak day prior to admission when she almost passed out but was able to hold onto her cane can sit down on a chair.  She reported a one week history of black tarry stools without red blood.  She denied NSAID use. In PCPs office hemoglobin was  low and she was advised to come to the ED. In the ED, hemoglobin 5.1, MCV 76, BUN 27, creatinine 1.26 and rectal exam by EDP showed empty rectal vault and FOBT negative.   She was admitted to the hospital and given blood transfusion with rapid improvement in her blood counts and stabilization since that time.  Dr. Watt Climes in GI performed endoscopy on 11/20 and found a cameron erosion and gastric polyps.  Subsequent CT of the abdomen and pelvis showed sigmoid diverticulosis.  Her hgb is stable, MCV is in the high 70s and iron supplementation was started.  She should also stay on PPI.    HPI/Subjective: Patient doing well this morning, reports much improvement in her symptoms.  She has chronic diarrhea and has continued to struggle with these symptoms while here.  She ate breakfast without issue and has been told by someone (?GI, no note) that she can be discharged home today.    Assessment/Plan: Acute posthemorrhagic anemia:  - Received 3 Units of PRBC, today Hgb stable at 8.8, MCV 77.2 - Had Upper endoscopy 11/20 Report as follows: 1. Tortuous distal esophagus with moderate-sized hiatal hernia  andone small Cameron erosion 2. Multiple tiny gastric polyps a few biopsied 3. Otherwise within normal limits EGD - Had CT abdomen/pelvis, report: Extensive atherosclerotic disease. Large hiatal hernia. 6 mm LEFT lung base nodule, recommendation below. Small umbilical hernia. Sigmoid diverticulosis. Question 3 mm nonobstructing distal LEFT ureteral calculus versus juxta ureteral phlebolith unchanged since 2010; no ureteral dilatation or hydronephrosis. - Possible bleed from diverticulosis (slow bleed given melena) or cameron erosion.  She will stay on protonix daily, avoid NSAID use and start taking iron in the outpatient setting.    Upper GI bleed:  - See above for discussion  Back pain:  - Follows with neurosurgery/orthopedics and is supposed to have MRI tomorrow per her - On Norco for pain - Avoid NSAIDs in future  Hypertension:  - Losartan, can restart home HCTZ once she is discharged.   Type II DM:  - NovoLog SSI. - CBGs range from 118 - 150 - Restart oral hypoglycemics on discharge  Chronic diarrhea:  - No change, chronic since cholecystectomy.     Code Status: FULL Family Communication: no family present at time of exam Disposition Plan: Likely discharge today after lunch and confirmation with GI team.   Consultants: GI  Procedure/Significant Events: Upper endoscopy 11/20  CT abdomen/pelvis 11/20  Culture MRSA PCR negative  Antibiotics: none  DVT prophylaxis: SCDs due to GIB  LINES / TUBES:  PIV  Continuous Infusions: . sodium chloride 20 mL (12/09/13 1619)    Objective: VITAL SIGNS: Temp: 98.3 F (36.8 C) (11/21 0500) Temp Source:  Oral (11/21 0914) BP: 159/93 mmHg (11/21 0914) Pulse Rate: 76 (11/21 0914)    Intake/Output Summary (Last 24 hours) at 12/11/13 1009 Last data filed at 12/10/13 2000  Gross per 24 hour  Intake   1020 ml  Output      0 ml  Net   1020 ml     Exam: General: NAD, alert and oriented Lungs: CTAB, no  wheezing Cardiovascular: Regular rate and rhythm without murmur gallop or rub normal S1 and S2 Abdomen: NT, ND, +BS Extremities: No significant cyanosis, clubbing, or edema bilateral lower extremities  Data Reviewed: Basic Metabolic Panel:  Recent Labs Lab 12/09/13 1520 12/10/13 0958  NA 139 141  K 3.7 3.7  CL 102 106  CO2 24 19  GLUCOSE 116* 120*  BUN 27* 18  CREATININE 1.25* 1.17*  CALCIUM 9.0 9.0   Liver Function Tests:  Recent Labs Lab 12/09/13 1520  AST 12  ALT 10  ALKPHOS 104  BILITOT <0.2*  PROT 6.5  ALBUMIN 3.2*   No results for input(s): LIPASE, AMYLASE in the last 168 hours. No results for input(s): AMMONIA in the last 168 hours. CBC:  Recent Labs Lab 12/09/13 1520 12/10/13 0508 12/10/13 0958 12/10/13 1834 12/11/13 0110  WBC 6.2 6.7 7.4 7.3 7.1  HGB 5.1* 8.7* 9.0* 8.9* 8.8*  HCT 17.7* 27.9* 29.1* 29.1* 28.1*  MCV 76.0* 77.1* 76.2* 75.6* 77.2*  PLT 291 263 251 287 264   Cardiac Enzymes: No results for input(s): CKTOTAL, CKMB, CKMBINDEX, TROPONINI in the last 168 hours. BNP (last 3 results) No results for input(s): PROBNP in the last 8760 hours. CBG:  Recent Labs Lab 12/09/13 2127 12/10/13 0829 12/10/13 1205 12/10/13 2213 12/11/13 0913  GLUCAP 132* 118* 118* 97 150*    Recent Results (from the past 240 hour(s))  MRSA PCR Screening     Status: None   Collection Time: 12/10/13  6:28 AM  Result Value Ref Range Status   MRSA by PCR NEGATIVE NEGATIVE Final    Comment:        The GeneXpert MRSA Assay (FDA approved for NASAL specimens only), is one component of a comprehensive MRSA colonization surveillance program. It is not intended to diagnose MRSA infection nor to guide or monitor treatment for MRSA infections.      Studies:  Recent x-ray studies have been reviewed in detail by the Attending Physician  Scheduled Meds:  Scheduled Meds: . sodium chloride  10 mL/hr Intravenous Once  . docusate sodium  100 mg Oral BID  .  ferrous sulfate  325 mg Oral TID WC  . insulin aspart  0-9 Units Subcutaneous TID WC  . losartan  100 mg Oral Daily  . pantoprazole  40 mg Oral Daily  . sodium chloride  3 mL Intravenous Q12H    Time spent on care of this patient: 40 mins   Gilles Chiquito , MD   [facility] Office  [phone] Pager - [phone]  On-Call/Text Page:      [email or web address]      password [email or web address]  If 7PM-7AM, please contact night-coverage [email or web address] Password [email or web address] 12/11/2013, 10:09 AM   LOS: 2 days

## 2013-12-11 NOTE — Progress Notes (Signed)
Kendra Maldonado 11:14 AM  Subjective: Patient doing well without complaints and no signs of bleeding and we reviewed her CT and her home medicines and she has no other complaints  Objective: Vital signs stable afebrile no acute distress abdomen is soft nontender hemoglobin stable CT okay without significant GI pathology  Assessment: Microcytic anemia  Plan: Okay with me to go home and start iron once or twice a day which was discussed and I will see her back in 2 weeks to recheck CBC and set her up for an outpatient colonoscopy which we discussed and she is in agreement and please call me back if I can be of any further assistance and continue pump inhibitors  Kendra Maldonado E

## 2013-12-11 NOTE — Discharge Summary (Signed)
Physician Discharge Summary  Kendra Maldonado HKV:[id number] DOB: [birth year] DOA: 12/09/2013  PCP: Irven Shelling, MD  Admit date: 12/09/2013 Discharge date: 12/11/2013  Recommendations for Outpatient Follow-up:  1. Pt will need to follow up with PCP in 2-3 weeks post discharge 2. Please also check CBC to evaluate Hg and Hct levels 3. F/U endoscopic biopsies  Discharge Diagnoses:  Principal Problem:   Acute posthemorrhagic anemia Active Problems:   Upper GI bleed   Back pain   Chronic diarrhea   Symptomatic anemia    Discharge Condition: Stable  Diet recommendation: Heart healthy diet discussed in details   History of present illness:  From H&P by Dr. Crisoforo Oxford Kendra Maldonado is a 79 y.o. female with history of DM 2, HTN, EGD confirmed hiatal hernia, chronic diarrhea post cholecystectomy, back pain-status post recent injection, presented to ED from PCPs office with above complaints. Patient states that she's been having severe mid back pain for which she underwent spinal steroid injection approximately a month ago. Since then she has noticed gradual onset and progressively worsening intermittent dizziness and lightheadedness. The symptoms seem to peak yesterday when she almost passed out but was able to hold onto her came can sit down on a chair. She denies chest pain, dyspnea or strokelike symptoms. She gives one week history of black tarry stools without red blood. She states that she did not make much of it and did not think that it was blood and was more concerned about her relentless back pain. She denies NSAID use. Today she called life alert who took her to PCPs office where hemoglobin was told to be low and she was advised to come to the ED. In the ED, hemoglobin 5.1, MCV 76, BUN*27, creatinine 1.26 and rectal exam by EDP showed empty rectal vault and FOBT negative. She was started on blood transfusion and hospitalist admission was requested. Patient states that she had a  negative colonoscopy in 2011.    Hospital Course:  Principal Problem: Acute posthemorrhagic anemia due to upper GI Bleeding: Kendra Maldonado is a 80yo woman who presented with melena for about 1 weeks an dizziness, lightheadedness for about a month.  She had an episode of pre-syncope which led her to be evaluated.  Her Hgb in the ED was 5.1, FOBT was negative.  She was admitted for acute GI blood loss. She received a total of 3 Units of PRBC with improvement in her Hgb to a steady state around 8.  She was started on Iron.  GI was consulted and performed an upper endoscopy on 11/20 which found a small cameron erosion in the esophagus, multiple gastric polyps.  A Ct of the abdomen showed a large hiatal hernia and sigmoid diverticulosis.  There was no obvious source of bleeding discovered.  The CT also showed a lung nodule (see below).  As her H/H were stable, she was discharged home with follow up with GI.  She was ambulating in the room without dizziness at time of discharge.  She has chronic diarrhea and continued to have bowel movements throughout her stay.   Active Problems: Pulmonary nodule: Found on CT abdomen, 53mm Left lung base.  She reported being a never smoker. She will need a follow up CT of the chest in about 12 months.   Back pain: Chronic issue, stable while in house  Chronic diarrhea: Chronic issue, stable while in house  HTN/DM: Home medications restarted on discharge.    Procedures/Studies: Ct Abdomen Pelvis W Contrast  12/10/2013  CLINICAL DATA:  Symptomatic anemia and melena, hemoglobin 5.1 ; history type 2 diabetes, hypertension  EXAM: CT ABDOMEN AND PELVIS WITH CONTRAST  TECHNIQUE: Multidetector CT imaging of the abdomen and pelvis was performed using the standard protocol following bolus administration of intravenous contrast. Sagittal and coronal MPR images reconstructed from axial data set.  CONTRAST:  66mL OMNIPAQUE IOHEXOL 300 MG/ML SOLN IV. Dilute oral contrast.   COMPARISON:  12/24/2008  FINDINGS: 6 mm peripheral nodular density LEFT lower lobe.  Minimal bibasilar atelectasis.  Large hiatal hernia, partially collapsed, wall thickness incompletely assessed.  Tiny cyst inferior pole LEFT kidney.  Liver, spleen, pancreas, kidneys, and adrenal glands otherwise normal appearance.  Normal appendix.  Extensive atherosclerotic calcification.  Gallbladder surgically absent.  Small umbilical hernia containing fat.  Sigmoid diverticulosis with bowel wall thickening or evidence of diverticulitis.  Stomach and bowel loops otherwise normal appearance.  Normal appearing bladder, uterus and adnexae.  3 mm nonobstructing calcification is identified at the distal LEFT ureter image 69, nonobstructing, unchanged since 2010, question immediately adjacent to rather than within LEFT ureter.  Scattered pelvic phleboliths.  No mass, adenopathy, free fluid, free air, or inflammatory process.  Bones appear demineralized with mild degenerative disc disease changes L5-S1.  IMPRESSION: Extensive atherosclerotic disease.  Large hiatal hernia.  6 mm LEFT lung base nodule, recommendation below.  Small umbilical hernia.  Sigmoid diverticulosis.  Question 3 mm nonobstructing distal LEFT ureteral calculus versus juxta ureteral phlebolith unchanged since 2010; no ureteral dilatation or hydronephrosis.  If the patient is at high risk for bronchogenic carcinoma, follow-up chest CT at 6-12 months is recommended. If the patient is at low risk for bronchogenic carcinoma, follow-up chest CT at 12 months is recommended. This recommendation follows the consensus statement: Guidelines for Management of Small Pulmonary Nodules Detected on CT Scans: A Statement from the [place] as published in Radiology 2005;237:395-400.   Electronically Signed   By: Lavonia Dana M.D.   On: 12/10/2013 22:05   Dg Chest Portable 1 View  12/09/2013   CLINICAL DATA:  Dizziness.  EXAM: PORTABLE CHEST - 1 VIEW  COMPARISON:   10/24/2008  FINDINGS: Heart size is within normal limits. Both lungs are clear. Possible hiatal hernia noted. Surgical clips again seen in the left axilla.  IMPRESSION: No active cardiopulmonary disease.  Question hiatal hernia.   Electronically Signed   By: Earle Gell M.D.   On: 12/09/2013 16:40     EGD by Dr. Watt Climes  Consultations:  GI  Antibiotics:  none  Discharge Exam: Filed Vitals:   12/11/13 1236  BP: 147/83  Pulse: 69  Temp: 98.5 F (36.9 C)  Resp: 20   Filed Vitals:   12/11/13 0058 12/11/13 0500 12/11/13 0914 12/11/13 1236  BP: 144/64 145/52 159/93 147/83  Pulse: 58 82 76 69  Temp: 98.3 F (36.8 C) 98.3 F (36.8 C)  98.5 F (36.9 C)  TempSrc: Oral Oral Oral Oral  Resp: 13 21 29 20   Height:      Weight:  169 lb 1.5 oz (76.7 kg)    SpO2: 94% 100% 98% 99%    General: NAD, alert and oriented Lungs: CTAB, no wheezing Cardiovascular: Regular rate and rhythm without murmur gallop or rub normal S1 and S2 Abdomen: NT, ND, +BS Extremities: No significant cyanosis, clubbing, or edema bilateral lower extremities  Discharge Instructions     Medication List    ASK your doctor about these medications        CALCIUM PO  Take  1 tablet by mouth daily.     D 2000 2000 UNITS Tabs  Generic drug:  Cholecalciferol  Take 2,000 Units by mouth daily.     diphenoxylate-atropine 2.5-0.025 MG per tablet  Commonly known as:  LOMOTIL  Take 2 tablets by mouth daily as needed for diarrhea or loose stools.     FLUZONE HIGH-DOSE 0.5 ML Susy  Generic drug:  Influenza Vac Split High-Dose  Inject 1 application into the muscle once.     hydrochlorothiazide 25 MG tablet  Commonly known as:  HYDRODIURIL  Take 25 mg by mouth daily.     HYDROcodone-acetaminophen 5-325 MG per tablet  Commonly known as:  NORCO/VICODIN  Take 1 tablet by mouth every 6 (six) hours as needed.     losartan 100 MG tablet  Commonly known as:  COZAAR  Take 100 mg by mouth daily.     metFORMIN 500  MG tablet  Commonly known as:  GLUCOPHAGE  Take 500 mg by mouth 2 (two) times daily.     omeprazole 20 MG capsule  Commonly known as:  PRILOSEC  Take 20 mg by mouth daily.           Follow-up Information    Follow up with [facility], MD In 2 weeks.   Specialty:  Gastroenterology   Why:  Call to make an appointment if you do not hear from the office   Contact information:   [address], [place] [address] [phone]        The results of significant diagnostics from this hospitalization (including imaging, microbiology, ancillary and laboratory) are listed below for reference.     Microbiology: Recent Results (from the past 240 hour(s))  MRSA PCR Screening     Status: None   Collection Time: 12/10/13  6:28 AM  Result Value Ref Range Status   MRSA by PCR NEGATIVE NEGATIVE Final    Comment:        The GeneXpert MRSA Assay (FDA approved for NASAL specimens only), is one component of a comprehensive MRSA colonization surveillance program. It is not intended to diagnose MRSA infection nor to guide or monitor treatment for MRSA infections.      Labs: Basic Metabolic Panel:  Recent Labs Lab 12/09/13 1520 12/10/13 0958  NA 139 141  K 3.7 3.7  CL 102 106  CO2 24 19  GLUCOSE 116* 120*  BUN 27* 18  CREATININE 1.25* 1.17*  CALCIUM 9.0 9.0   Liver Function Tests:  Recent Labs Lab 12/09/13 1520  AST 12  ALT 10  ALKPHOS 104  BILITOT <0.2*  PROT 6.5  ALBUMIN 3.2*   No results for input(s): LIPASE, AMYLASE in the last 168 hours. No results for input(s): AMMONIA in the last 168 hours. CBC:  Recent Labs Lab 12/09/13 1520 12/10/13 0508 12/10/13 0958 12/10/13 1834 12/11/13 0110  WBC 6.2 6.7 7.4 7.3 7.1  HGB 5.1* 8.7* 9.0* 8.9* 8.8*  HCT 17.7* 27.9* 29.1* 29.1* 28.1*  MCV 76.0* 77.1* 76.2* 75.6* 77.2*  PLT 291 263 251 287 264   Cardiac Enzymes: No results for input(s): CKTOTAL, CKMB, CKMBINDEX, TROPONINI in the last 168  hours. BNP: BNP (last 3 results) No results for input(s): PROBNP in the last 8760 hours. CBG:  Recent Labs Lab 12/10/13 0829 12/10/13 1205 12/10/13 2213 12/11/13 0913 12/11/13 1235  GLUCAP 118* 118* 97 150* 120*     SIGNED: Time coordinating discharge: 40 minutes  MULLEN, EMILY, MD  [facility] 12/11/2013, 2:34  PM Pager [phone]  If 7PM-7AM, please contact night-coverage [email or web address] Password [email or web address]

## 2013-12-13 ENCOUNTER — Encounter (HOSPITAL_COMMUNITY): Payer: Self-pay | Admitting: Gastroenterology

## 2014-02-09 ENCOUNTER — Other Ambulatory Visit: Payer: Self-pay | Admitting: Gastroenterology

## 2014-02-19 ENCOUNTER — Other Ambulatory Visit: Payer: Self-pay | Admitting: Internal Medicine

## 2015-03-22 DIAGNOSIS — I639 Cerebral infarction, unspecified: Secondary | ICD-10-CM

## 2015-03-22 HISTORY — DX: Cerebral infarction, unspecified: I63.9

## 2015-04-03 ENCOUNTER — Encounter (HOSPITAL_COMMUNITY): Payer: Self-pay | Admitting: Emergency Medicine

## 2015-04-03 DIAGNOSIS — Z8249 Family history of ischemic heart disease and other diseases of the circulatory system: Secondary | ICD-10-CM

## 2015-04-03 DIAGNOSIS — Z885 Allergy status to narcotic agent status: Secondary | ICD-10-CM

## 2015-04-03 DIAGNOSIS — I639 Cerebral infarction, unspecified: Secondary | ICD-10-CM | POA: Diagnosis not present

## 2015-04-03 DIAGNOSIS — Z853 Personal history of malignant neoplasm of breast: Secondary | ICD-10-CM

## 2015-04-03 DIAGNOSIS — E876 Hypokalemia: Secondary | ICD-10-CM | POA: Diagnosis present

## 2015-04-03 DIAGNOSIS — K449 Diaphragmatic hernia without obstruction or gangrene: Secondary | ICD-10-CM | POA: Diagnosis present

## 2015-04-03 DIAGNOSIS — E119 Type 2 diabetes mellitus without complications: Secondary | ICD-10-CM | POA: Diagnosis present

## 2015-04-03 DIAGNOSIS — Z79899 Other long term (current) drug therapy: Secondary | ICD-10-CM

## 2015-04-03 DIAGNOSIS — I10 Essential (primary) hypertension: Secondary | ICD-10-CM | POA: Diagnosis present

## 2015-04-03 DIAGNOSIS — E785 Hyperlipidemia, unspecified: Secondary | ICD-10-CM | POA: Diagnosis present

## 2015-04-03 DIAGNOSIS — Z7984 Long term (current) use of oral hypoglycemic drugs: Secondary | ICD-10-CM

## 2015-04-03 LAB — COMPREHENSIVE METABOLIC PANEL
ALK PHOS: 171 U/L — AB (ref 38–126)
ALT: 16 U/L (ref 14–54)
ANION GAP: 16 — AB (ref 5–15)
AST: 31 U/L (ref 15–41)
Albumin: 3.8 g/dL (ref 3.5–5.0)
BILIRUBIN TOTAL: 0.7 mg/dL (ref 0.3–1.2)
BUN: 28 mg/dL — ABNORMAL HIGH (ref 6–20)
CO2: 22 mmol/L (ref 22–32)
CREATININE: 1.05 mg/dL — AB (ref 0.44–1.00)
Calcium: 9.5 mg/dL (ref 8.9–10.3)
Chloride: 107 mmol/L (ref 101–111)
GFR calc non Af Amer: 49 mL/min — ABNORMAL LOW (ref >60)
GFR, EST AFRICAN AMERICAN: 57 mL/min — AB (ref >60)
Glucose, Bld: 141 mg/dL — ABNORMAL HIGH (ref 65–99)
Potassium: 3.7 mmol/L (ref 3.5–5.1)
Sodium: 145 mmol/L (ref 135–145)
TOTAL PROTEIN: 7.3 g/dL (ref 6.5–8.1)

## 2015-04-03 LAB — CBC WITH DIFFERENTIAL/PLATELET
Basophils Absolute: 0 10*3/uL (ref 0.0–0.1)
Basophils Relative: 1 %
Eosinophils Absolute: 0.2 10*3/uL (ref 0.0–0.7)
Eosinophils Relative: 4 %
HCT: 38.3 % (ref 36.0–46.0)
HEMOGLOBIN: 12 g/dL (ref 12.0–15.0)
LYMPHS ABS: 1.3 10*3/uL (ref 0.7–4.0)
Lymphocytes Relative: 25 %
MCH: 29.3 pg (ref 26.0–34.0)
MCHC: 31.3 g/dL (ref 30.0–36.0)
MCV: 93.6 fL (ref 78.0–100.0)
MONOS PCT: 8 %
Monocytes Absolute: 0.4 10*3/uL (ref 0.1–1.0)
NEUTROS ABS: 3.5 10*3/uL (ref 1.7–7.7)
Neutrophils Relative %: 62 %
Platelets: 200 10*3/uL (ref 150–400)
RBC: 4.09 MIL/uL (ref 3.87–5.11)
RDW: 14.1 % (ref 11.5–15.5)
WBC: 5.5 10*3/uL (ref 4.0–10.5)

## 2015-04-03 NOTE — ED Notes (Signed)
Pt. arrived with EMS from home reports vertigo today unrelieved by prescription Meclizine .

## 2015-04-04 ENCOUNTER — Inpatient Hospital Stay (HOSPITAL_COMMUNITY)
Admission: EM | Admit: 2015-04-04 | Discharge: 2015-04-06 | DRG: 066 | Disposition: A | Discharge status: Home Health | Payer: Medicare Other | Attending: Internal Medicine | Admitting: Internal Medicine

## 2015-04-04 ENCOUNTER — Inpatient Hospital Stay (HOSPITAL_COMMUNITY): Payer: Medicare Other

## 2015-04-04 ENCOUNTER — Emergency Department (HOSPITAL_COMMUNITY): Payer: Medicare Other

## 2015-04-04 ENCOUNTER — Encounter (HOSPITAL_COMMUNITY): Payer: Self-pay | Admitting: Internal Medicine

## 2015-04-04 DIAGNOSIS — E785 Hyperlipidemia, unspecified: Secondary | ICD-10-CM

## 2015-04-04 DIAGNOSIS — Z8249 Family history of ischemic heart disease and other diseases of the circulatory system: Secondary | ICD-10-CM | POA: Diagnosis not present

## 2015-04-04 DIAGNOSIS — E1159 Type 2 diabetes mellitus with other circulatory complications: Secondary | ICD-10-CM | POA: Diagnosis present

## 2015-04-04 DIAGNOSIS — E119 Type 2 diabetes mellitus without complications: Secondary | ICD-10-CM | POA: Diagnosis present

## 2015-04-04 DIAGNOSIS — K449 Diaphragmatic hernia without obstruction or gangrene: Secondary | ICD-10-CM | POA: Diagnosis present

## 2015-04-04 DIAGNOSIS — I639 Cerebral infarction, unspecified: Secondary | ICD-10-CM | POA: Diagnosis present

## 2015-04-04 DIAGNOSIS — I63541 Cerebral infarction due to unspecified occlusion or stenosis of right cerebellar artery: Secondary | ICD-10-CM

## 2015-04-04 DIAGNOSIS — E876 Hypokalemia: Secondary | ICD-10-CM | POA: Diagnosis present

## 2015-04-04 DIAGNOSIS — I6789 Other cerebrovascular disease: Secondary | ICD-10-CM | POA: Diagnosis not present

## 2015-04-04 DIAGNOSIS — Z885 Allergy status to narcotic agent status: Secondary | ICD-10-CM | POA: Diagnosis not present

## 2015-04-04 DIAGNOSIS — I10 Essential (primary) hypertension: Secondary | ICD-10-CM | POA: Diagnosis present

## 2015-04-04 DIAGNOSIS — Z79899 Other long term (current) drug therapy: Secondary | ICD-10-CM | POA: Diagnosis not present

## 2015-04-04 DIAGNOSIS — Z7984 Long term (current) use of oral hypoglycemic drugs: Secondary | ICD-10-CM | POA: Diagnosis not present

## 2015-04-04 DIAGNOSIS — Z853 Personal history of malignant neoplasm of breast: Secondary | ICD-10-CM | POA: Diagnosis not present

## 2015-04-04 DIAGNOSIS — I63011 Cerebral infarction due to thrombosis of right vertebral artery: Secondary | ICD-10-CM

## 2015-04-04 LAB — I-STAT CHEM 8, ED
BUN: 26 mg/dL — AB (ref 6–20)
CALCIUM ION: 1.13 mmol/L (ref 1.13–1.30)
CHLORIDE: 106 mmol/L (ref 101–111)
CREATININE: 0.9 mg/dL (ref 0.44–1.00)
Glucose, Bld: 125 mg/dL — ABNORMAL HIGH (ref 65–99)
HCT: 36 % (ref 36.0–46.0)
Hemoglobin: 12.2 g/dL (ref 12.0–15.0)
Potassium: 3.5 mmol/L (ref 3.5–5.1)
Sodium: 144 mmol/L (ref 135–145)
TCO2: 25 mmol/L (ref 0–100)

## 2015-04-04 LAB — PROTIME-INR
INR: 1.04 (ref 0.00–1.49)
Prothrombin Time: 13.8 seconds (ref 11.6–15.2)

## 2015-04-04 LAB — COMPREHENSIVE METABOLIC PANEL
ALBUMIN: 3.5 g/dL (ref 3.5–5.0)
ALK PHOS: 152 U/L — AB (ref 38–126)
ALT: 16 U/L (ref 14–54)
AST: 19 U/L (ref 15–41)
Anion gap: 12 (ref 5–15)
BILIRUBIN TOTAL: 0.4 mg/dL (ref 0.3–1.2)
BUN: 20 mg/dL (ref 6–20)
CALCIUM: 9.2 mg/dL (ref 8.9–10.3)
CO2: 23 mmol/L (ref 22–32)
Chloride: 108 mmol/L (ref 101–111)
Creatinine, Ser: 0.86 mg/dL (ref 0.44–1.00)
GFR calc Af Amer: >60 mL/min (ref >60)
GFR calc non Af Amer: >60 mL/min (ref >60)
GLUCOSE: 133 mg/dL — AB (ref 65–99)
Potassium: 3.4 mmol/L — ABNORMAL LOW (ref 3.5–5.1)
Sodium: 143 mmol/L (ref 135–145)
TOTAL PROTEIN: 7 g/dL (ref 6.5–8.1)

## 2015-04-04 LAB — CBG MONITORING, ED
GLUCOSE-CAPILLARY: 200 mg/dL — AB (ref 65–99)
Glucose-Capillary: 128 mg/dL — ABNORMAL HIGH (ref 65–99)

## 2015-04-04 LAB — LIPID PANEL
Cholesterol: 216 mg/dL — ABNORMAL HIGH (ref 0–200)
HDL: 65 mg/dL (ref >40)
LDL CALC: 136 mg/dL — AB (ref 0–99)
Total CHOL/HDL Ratio: 3.3 RATIO
Triglycerides: 75 mg/dL (ref <150)
VLDL: 15 mg/dL (ref 0–40)

## 2015-04-04 LAB — CBC
HCT: 36.3 % (ref 36.0–46.0)
HEMATOCRIT: 38.1 % (ref 36.0–46.0)
HEMOGLOBIN: 11.2 g/dL — AB (ref 12.0–15.0)
HEMOGLOBIN: 11.8 g/dL — AB (ref 12.0–15.0)
MCH: 28.7 pg (ref 26.0–34.0)
MCH: 28.9 pg (ref 26.0–34.0)
MCHC: 30.9 g/dL (ref 30.0–36.0)
MCHC: 31 g/dL (ref 30.0–36.0)
MCV: 93.1 fL (ref 78.0–100.0)
MCV: 93.4 fL (ref 78.0–100.0)
Platelets: 170 10*3/uL (ref 150–400)
Platelets: 170 10*3/uL (ref 150–400)
RBC: 3.9 MIL/uL (ref 3.87–5.11)
RBC: 4.08 MIL/uL (ref 3.87–5.11)
RDW: 14.2 % (ref 11.5–15.5)
RDW: 14.2 % (ref 11.5–15.5)
WBC: 5.1 10*3/uL (ref 4.0–10.5)
WBC: 5 10*3/uL (ref 4.0–10.5)

## 2015-04-04 LAB — DIFFERENTIAL
Basophils Absolute: 0 10*3/uL (ref 0.0–0.1)
Basophils Relative: 1 %
EOS PCT: 3 %
Eosinophils Absolute: 0.2 10*3/uL (ref 0.0–0.7)
LYMPHS ABS: 1.5 10*3/uL (ref 0.7–4.0)
LYMPHS PCT: 29 %
Monocytes Absolute: 0.6 10*3/uL (ref 0.1–1.0)
Monocytes Relative: 11 %
NEUTROS ABS: 2.9 10*3/uL (ref 1.7–7.7)
NEUTROS PCT: 56 %

## 2015-04-04 LAB — URINALYSIS, ROUTINE W REFLEX MICROSCOPIC
BILIRUBIN URINE: NEGATIVE
Glucose, UA: NEGATIVE mg/dL
HGB URINE DIPSTICK: NEGATIVE
KETONES UR: NEGATIVE mg/dL
Leukocytes, UA: NEGATIVE
Nitrite: NEGATIVE
Protein, ur: 30 mg/dL — AB
Specific Gravity, Urine: 1.024 (ref 1.005–1.030)
pH: 5.5 (ref 5.0–8.0)

## 2015-04-04 LAB — RAPID URINE DRUG SCREEN, HOSP PERFORMED
AMPHETAMINES: NOT DETECTED
BENZODIAZEPINES: NOT DETECTED
Barbiturates: NOT DETECTED
COCAINE: NOT DETECTED
Opiates: NOT DETECTED
Tetrahydrocannabinol: NOT DETECTED

## 2015-04-04 LAB — ETHANOL: Alcohol, Ethyl (B): <5 mg/dL (ref <5)

## 2015-04-04 LAB — GLUCOSE, CAPILLARY
GLUCOSE-CAPILLARY: 141 mg/dL — AB (ref 65–99)
Glucose-Capillary: 132 mg/dL — ABNORMAL HIGH (ref 65–99)

## 2015-04-04 LAB — URINE MICROSCOPIC-ADD ON

## 2015-04-04 LAB — APTT: aPTT: 28 seconds (ref 24–37)

## 2015-04-04 LAB — I-STAT TROPONIN, ED: TROPONIN I, POC: 0.01 ng/mL (ref 0.00–0.08)

## 2015-04-04 MED ORDER — ASPIRIN 325 MG PO TABS
325.0000 mg | ORAL_TABLET | Freq: Every day | ORAL | Status: DC
  Administered 2015-04-04 – 2015-04-05 (×2): 325 mg via ORAL
  Filled 2015-04-04 (×2): qty 1

## 2015-04-04 MED ORDER — IOHEXOL 350 MG/ML SOLN
50.0000 mL | Freq: Once | INTRAVENOUS | Status: AC | PRN
  Administered 2015-04-04: 50 mL via INTRAVENOUS

## 2015-04-04 MED ORDER — INSULIN ASPART 100 UNIT/ML ~~LOC~~ SOLN
0.0000 [IU] | Freq: Three times a day (TID) | SUBCUTANEOUS | Status: DC
  Administered 2015-04-04: 1 [IU] via SUBCUTANEOUS
  Administered 2015-04-04: 2 [IU] via SUBCUTANEOUS
  Administered 2015-04-05 (×2): 1 [IU] via SUBCUTANEOUS
  Administered 2015-04-05: 3 [IU] via SUBCUTANEOUS
  Administered 2015-04-05 – 2015-04-06 (×2): 1 [IU] via SUBCUTANEOUS
  Filled 2015-04-04: qty 1

## 2015-04-04 MED ORDER — FERROUS SULFATE 325 (65 FE) MG PO TABS
325.0000 mg | ORAL_TABLET | Freq: Two times a day (BID) | ORAL | Status: DC
  Administered 2015-04-04 – 2015-04-06 (×5): 325 mg via ORAL
  Filled 2015-04-04 (×5): qty 1

## 2015-04-04 MED ORDER — PANTOPRAZOLE SODIUM 40 MG PO TBEC
40.0000 mg | DELAYED_RELEASE_TABLET | Freq: Every day | ORAL | Status: DC
  Administered 2015-04-04 – 2015-04-06 (×3): 40 mg via ORAL
  Filled 2015-04-04 (×3): qty 1

## 2015-04-04 MED ORDER — SODIUM CHLORIDE 0.9 % IV SOLN
INTRAVENOUS | Status: DC
  Administered 2015-04-04: 75 mL/h via INTRAVENOUS

## 2015-04-04 MED ORDER — DIPHENOXYLATE-ATROPINE 2.5-0.025 MG PO TABS
2.0000 | ORAL_TABLET | Freq: Every day | ORAL | Status: DC | PRN
  Administered 2015-04-04: 2 via ORAL
  Filled 2015-04-04: qty 2

## 2015-04-04 MED ORDER — ASPIRIN 300 MG RE SUPP: 300.0000 mg | Freq: Every day | RECTAL | Status: DC

## 2015-04-04 MED ORDER — VITAMIN D3 25 MCG (1000 UNIT) PO TABS
2000.0000 [IU] | ORAL_TABLET | Freq: Every day | ORAL | Status: DC
  Administered 2015-04-04 – 2015-04-06 (×3): 2000 [IU] via ORAL
  Filled 2015-04-04 (×7): qty 2

## 2015-04-04 MED ORDER — STROKE: EARLY STAGES OF RECOVERY BOOK
Freq: Once | Status: AC
  Administered 2015-04-04: 18:00:00
  Filled 2015-04-04 (×2): qty 1

## 2015-04-04 MED ORDER — DIAZEPAM 2 MG PO TABS
2.0000 mg | ORAL_TABLET | Freq: Once | ORAL | Status: AC
  Administered 2015-04-04: 2 mg via ORAL
  Filled 2015-04-04: qty 1

## 2015-04-04 MED ORDER — ENOXAPARIN SODIUM 40 MG/0.4ML ~~LOC~~ SOLN
40.0000 mg | SUBCUTANEOUS | Status: DC
  Administered 2015-04-04 – 2015-04-06 (×3): 40 mg via SUBCUTANEOUS
  Filled 2015-04-04 (×3): qty 0.4

## 2015-04-04 MED ORDER — POTASSIUM CHLORIDE CRYS ER 20 MEQ PO TBCR
40.0000 meq | EXTENDED_RELEASE_TABLET | Freq: Once | ORAL | Status: AC
  Administered 2015-04-04: 40 meq via ORAL
  Filled 2015-04-04: qty 2

## 2015-04-04 MED ORDER — AMLODIPINE BESYLATE 5 MG PO TABS
5.0000 mg | ORAL_TABLET | Freq: Every day | ORAL | Status: DC
  Administered 2015-04-04 – 2015-04-06 (×3): 5 mg via ORAL
  Filled 2015-04-04 (×3): qty 1

## 2015-04-04 NOTE — ED Notes (Signed)
Report given to [facility] RN 

## 2015-04-04 NOTE — Progress Notes (Signed)
Kendra Maldonado is a 82 y.o. female patient admitted from ED awake, alert - oriented  X 4 - no acute distress noted.  VSS - Blood pressure 164/80, pulse 86, temperature 97.9 F (36.6 C), temperature source Oral, resp. rate 16, height 5' 7.5" (1.715 m), weight 77.111 kg (170 lb), SpO2 98 %.    IV in place, occlusive dsg intact without redness.  Orientation to room, and floor completed with information packet given to patient/family.  Patient declined safety video at this time.  Admission INP armband ID verified with patient/family, and in place.   SR up x 2, fall assessment complete, with patient and family able to verbalize understanding of risk associated with falls, and verbalized understanding to call nsg before up out of bed.  Call light within reach, patient able to voice, and demonstrate understanding.  Skin, clean-dry- intact without evidence of bruising, or skin tears.   No evidence of skin break down noted on exam.     Will cont to eval and treat per MD orders.  [name], RN 04/04/2015 5:06 PM

## 2015-04-04 NOTE — Progress Notes (Signed)
Patient admitted after midnight, please see H&P.  MRI +.  Neuro consult appreciated Eulogio Bear DO

## 2015-04-04 NOTE — ED Notes (Signed)
Patient being transported to MRI at this time.

## 2015-04-04 NOTE — ED Notes (Signed)
MD at bedside. 

## 2015-04-04 NOTE — ED Notes (Addendum)
IV attempted x 2. Without success.

## 2015-04-04 NOTE — H&P (Signed)
[facility] History and Physical  Kendra Maldonado [id number] DOB: 08-09-1933 DOA: 04/04/2015  Referring physician: Dr. Claudine Mouton. PCP: Irven Shelling, MD  Specialists: None.  Chief Complaint: Dizziness.  HPI: Kendra Maldonado is a 82 y.o. female with history of diabetes mellitus, hypertension and previous history of GI bleeding 2015 presents to the ER because of persistent dizziness. Patient's symptoms started last evening around 5 PM. Despite taking meclizine patient was still dizzy and almost had a fall but did not follow loose consciousness. Denies any headache nausea vomiting. Denies losing function of hands or legs. Denies any difficulty speaking swallowing or any visual symptoms. MRI of the brain shows acute cerebellar stroke and on-call neurologist has been consulted patient has been admitted for further workup. Since patient's symptoms have been present since 5 PM patient was out of the TPA window.   Review of Systems: As presented in the history of presenting illness, rest negative.  Past Medical History  Diagnosis Date  . Diabetes mellitus type 2, uncontrolled ([place])   . Essential hypertension   . Chronic diarrhea   . Back pain   . Hiatal hernia     Stenosis of the spine  . Breast cancer, left breast ([place]) 2010    lumpectomy   Past Surgical History  Procedure Laterality Date  . Esophagogastroduodenoscopy (egd) with propofol N/A 12/10/2013    Procedure: ESOPHAGOGASTRODUODENOSCOPY (EGD) WITH PROPOFOL;  Surgeon: Jeryl Columbia, MD;  Location: [facility] ENDOSCOPY;  Service: Endoscopy;  Laterality: N/A;  . Cholecystectomy     Social History:  reports that she has never smoked. She has never used smokeless tobacco. She reports that she does not drink alcohol or use illicit drugs. Where does patient live At home. Can patient participate in ADLs? Yes.  Allergies  Allergen Reactions  . Codeine Rash  . Demerol [Meperidine] Rash    Family History:  Family History  Problem  Relation Age of Onset  . Hypertension Mother       Prior to Admission medications   Medication Sig Start Date End Date Taking? Authorizing Provider  amLODipine (NORVASC) 5 MG tablet Take 5 mg by mouth daily. 03/25/15  Yes Historical Provider, MD  Cholecalciferol (D 2000) 2000 UNITS TABS Take 2,000 Units by mouth daily.   Yes Historical Provider, MD  diphenoxylate-atropine (LOMOTIL) 2.5-0.025 MG per tablet Take 2 tablets by mouth daily as needed for diarrhea or loose stools.  09/20/13  Yes Historical Provider, MD  ferrous sulfate 325 (65 FE) MG tablet Take 1 tablet (325 mg total) by mouth daily with breakfast. Patient taking differently: Take 325 mg by mouth 2 (two) times daily with a meal.  12/11/13  Yes Sid Falcon, MD  meclizine (ANTIVERT) 25 MG tablet Take 25 mg by mouth as needed for dizziness.  03/16/15  Yes Historical Provider, MD  metFORMIN (GLUCOPHAGE) 500 MG tablet Take 500 mg by mouth 2 (two) times daily. 10/07/13  Yes Historical Provider, MD  omeprazole (PRILOSEC) 20 MG capsule Take 20 mg by mouth daily.   Yes Historical Provider, MD    Physical Exam: Filed Vitals:   04/04/15 0405 04/04/15 0430 04/04/15 0500 04/04/15 0600  BP:  173/76 165/78 162/75  Pulse:  79 77 77  Temp: 98.4 F (36.9 C)     TempSrc:      Resp:  21 16 22   Height:      Weight:      SpO2:  94% 95% 97%     General:  Moderately built and  nourished.  Eyes: Anicteric no pallor.  ENT: No discharge from the ears eyes nose or mouth.  Neck: No mass felt.  Cardiovascular: S1 and S2 heard.  Respiratory: No rhonchi or crepitations.  Abdomen: Soft nontender bowel sounds present.  Skin: No rash.  Musculoskeletal: No edema.  Psychiatric: Appears normal.  Neurologic: Alert awake oriented to time place and person. Moves all extremities 5 x 5. No facial asymmetry tongue is midline.  Labs on Admission:  Basic Metabolic Panel:  Recent Labs Lab 04/03/15 2004 04/04/15 0431  NA 145 144  K 3.7 3.5  CL  107 106  CO2 22  --   GLUCOSE 141* 125*  BUN 28* 26*  CREATININE 1.05* 0.90  CALCIUM 9.5  --    Liver Function Tests:  Recent Labs Lab 04/03/15 2004  AST 31  ALT 16  ALKPHOS 171*  BILITOT 0.7  PROT 7.3  ALBUMIN 3.8   No results for input(s): LIPASE, AMYLASE in the last 168 hours. No results for input(s): AMMONIA in the last 168 hours. CBC:  Recent Labs Lab 04/03/15 2004 04/04/15 0431 04/04/15 0451  WBC 5.5  --  5.1  NEUTROABS 3.5  --  2.9  HGB 12.0 12.2 11.2*  HCT 38.3 36.0 36.3  MCV 93.6  --  93.1  PLT 200  --  170   Cardiac Enzymes: No results for input(s): CKTOTAL, CKMB, CKMBINDEX, TROPONINI in the last 168 hours.  BNP (last 3 results) No results for input(s): BNP in the last 8760 hours.  ProBNP (last 3 results) No results for input(s): PROBNP in the last 8760 hours.  CBG: No results for input(s): GLUCAP in the last 168 hours.  Radiological Exams on Admission: Mr Brain Wo Contrast  04/04/2015  CLINICAL DATA:  Single episode of dizziness yesterday evening. Mild headache, weakness. History of hypertension, diabetes, breast cancer. EXAM: MRI HEAD WITHOUT CONTRAST TECHNIQUE: Multiplanar, multiecho pulse sequences of the brain and surrounding structures were obtained without intravenous contrast. COMPARISON:  CT head March 02, 2007 FINDINGS: Wedge-like reduced diffusion RIGHT inferior cerebellum. Corresponding low ADC values and T2 bright signal. No susceptibility artifact to suggest hemorrhage. Ventricles and sulci are normal for patient's age. Patchy to confluent supratentorial and pontine white matter FLAIR T2 hyperintensities without midline shift, mass effect or masses. No abnormal extra-axial fluid collections. Major intracranial vascular flow voids present at the skullbase. Status post RIGHT ocular lens implant. Small LEFT maxillary mucosal retention cyst. Mastoid air cells demonstrate trace effusion. No abnormal sellar expansion. No cerebellar tonsillar  ectopia. No suspicious calvarial bone marrow signal. Patient is edentulous. IMPRESSION: Acute RIGHT posterior inferior cerebellar artery territory infarct. Moderate chronic small vessel ischemic disease. Involutional changes. Electronically Signed   By: Elon Alas M.D.   On: 04/04/2015 03:44     Assessment/Plan Principal Problem:   Cerebellar stroke, acute ([place]) Active Problems:   Stroke ([place])   Hypertension   Stroke (cerebrum) ([facility])   Type 2 diabetes mellitus with vascular disease ([place])   1. Acute cerebellar stroke - did discuss with on-call neurologist will be seeing patient in consult. Will get MRA brain 2-D echo carotid Doppler and closely monitor in telemetry for any arrhythmias. Patient has been placed on aspirin cautioned that patient has had previous GI bleed in 2015. Check hemoglobin A1c and lipid panel. Physical therapy consult. 2. Diabetes mellitus type 2 - while inpatient and will keep patient on sliding scale coverage and hold metformin. 3. Hypertension - we'll continue home medication with the use of  hypertension due to acute stroke. 4. History of GI bleed - on Protonix. Note that patient is on aspirin now.   DVT Prophylaxis Lovenox.  Code Status: Full code.  Family Communication: Discussed with patient.  Disposition Plan: Admit to inpatient.    KAKRAKANDY,ARSHAD N. [facility] Pager [phone].  If 7PM-7AM, please contact night-coverage [email or web address] Password [facility] 04/04/2015, 6:44 AM

## 2015-04-04 NOTE — ED Notes (Signed)
POCT CBG resulted 200

## 2015-04-04 NOTE — Consult Note (Signed)
Requesting Physician: Dr. Eliseo Squires    Chief Complaint: Stroke  History obtained from:  Patient     HPI:                                                                                                                                         Kendra Maldonado is an 82 y.o. female who states she has been suffering from vertigo for the past 3 months and has been on meclizine. Per husband she will fall to the right and the symptoms seem to be intermittent. It has become bad enough she bought a walker for the vertigo events. Yesterday she came to the ED due to the inability to ambulate which was worse than prior. MRI was obtained showing a right cerebellar infarct.    Date last known well: Unable to determine Time last known well: Unable to determine tPA Given: No: no LSN     Past Medical History  Diagnosis Date  . Diabetes mellitus type 2, uncontrolled ([place])   . Essential hypertension   . Chronic diarrhea   . Back pain   . Hiatal hernia     Stenosis of the spine  . Breast cancer, left breast ([place]) 2010    lumpectomy    Past Surgical History  Procedure Laterality Date  . Esophagogastroduodenoscopy (egd) with propofol N/A 12/10/2013    Procedure: ESOPHAGOGASTRODUODENOSCOPY (EGD) WITH PROPOFOL;  Surgeon: Jeryl Columbia, MD;  Location: [facility] ENDOSCOPY;  Service: Endoscopy;  Laterality: N/A;  . Cholecystectomy      Family History  Problem Relation Age of Onset  . Hypertension Mother    Social History:  reports that she has never smoked. She has never used smokeless tobacco. She reports that she does not drink alcohol or use illicit drugs.  Allergies:  Allergies  Allergen Reactions  . Codeine Rash  . Demerol [Meperidine] Rash    Medications:                                                                                                                           Current Facility-Administered Medications  Medication Dose Route Frequency Provider Last Rate Last Dose  .  stroke: mapping our  early stages of recovery book   Does not apply Once [name], MD      . 0.9 %  sodium chloride infusion   Intravenous  Continuous [name], MD 75 mL/hr at 04/04/15 1009 75 mL/hr at 04/04/15 1009  . amLODipine (NORVASC) tablet 5 mg  5 mg Oral Daily [name], MD   5 mg at 04/04/15 1033  . aspirin suppository 300 mg  300 mg Rectal Daily [name], MD       Or  . aspirin tablet 325 mg  325 mg Oral Daily [name], MD   325 mg at 04/04/15 1036  . cholecalciferol (VITAMIN D) tablet 2,000 Units  2,000 Units Oral Daily [name], MD   2,000 Units at 04/04/15 1036  . enoxaparin (LOVENOX) injection 40 mg  40 mg Subcutaneous Q24H [name], MD   40 mg at 04/04/15 1041  . ferrous sulfate tablet 325 mg  325 mg Oral BID WC [name], MD   325 mg at 04/04/15 1032  . insulin aspart (novoLOG) injection 0-9 Units  0-9 Units Subcutaneous TID WC [name], MD   1 Units at 04/04/15 1017  . pantoprazole (PROTONIX) EC tablet 40 mg  40 mg Oral Daily [name], MD   40 mg at 04/04/15 1036   Current Outpatient Prescriptions  Medication Sig Dispense Refill  . amLODipine (NORVASC) 5 MG tablet Take 5 mg by mouth daily.    . Cholecalciferol (D 2000) 2000 UNITS TABS Take 2,000 Units by mouth daily.    . diphenoxylate-atropine (LOMOTIL) 2.5-0.025 MG per tablet Take 2 tablets by mouth daily as needed for diarrhea or loose stools.     . ferrous sulfate 325 (65 FE) MG tablet Take 1 tablet (325 mg total) by mouth daily with breakfast. (Patient taking differently: Take 325 mg by mouth 2 (two) times daily with a meal. ) 30 tablet 3  . meclizine (ANTIVERT) 25 MG tablet Take 25 mg by mouth as needed for dizziness.     . metFORMIN (GLUCOPHAGE) 500 MG tablet Take 500 mg by mouth 2 (two) times daily.    Marland Kitchen omeprazole (PRILOSEC) 20 MG capsule Take 20 mg by mouth daily.       ROS:                                                                                                                                        History obtained from the patient  General ROS: negative for - chills, fatigue, fever, night sweats, weight gain or weight loss Psychological ROS: negative for - behavioral disorder, hallucinations, memory difficulties, mood swings or suicidal ideation Ophthalmic ROS: negative for - blurry vision, double vision, eye pain or loss of vision ENT ROS: negative for - epistaxis, nasal discharge, oral lesions, sore throat, tinnitus or vertigo Allergy and Immunology ROS: negative for - hives or itchy/watery eyes Hematological and Lymphatic ROS: negative for - bleeding problems, bruising or swollen lymph nodes Endocrine ROS: negative for - galactorrhea, hair pattern changes, polydipsia/polyuria or  temperature intolerance Respiratory ROS: negative for - cough, hemoptysis, shortness of breath or wheezing Cardiovascular ROS: negative for - chest pain, dyspnea on exertion, edema or irregular heartbeat Gastrointestinal ROS: negative for - abdominal pain, diarrhea, hematemesis, nausea/vomiting or stool incontinence Genito-Urinary ROS: negative for - dysuria, hematuria, incontinence or urinary frequency/urgency Musculoskeletal ROS: negative for - joint swelling or muscular weakness Neurological ROS: as noted in HPI Dermatological ROS: negative for rash and skin lesion changes  Neurologic Examination:                                                                                                      Blood pressure 154/84, pulse 74, temperature 98.4 F (36.9 C), temperature source Oral, resp. rate 17, height 5' 7.5" (1.715 m), weight 74.844 kg (165 lb), SpO2 97 %.  HEENT-  Normocephalic, no lesions, without obvious abnormality.  Normal external eye and conjunctiva.  Normal TM's bilaterally.  Normal auditory canals and external ears. Normal external nose, mucus membranes and septum.  Normal pharynx. Cardiovascular- S1, S2 normal, pulses  palpable throughout   Lungs- chest clear, no wheezing, rales, normal symmetric air entry Abdomen- normal findings: bowel sounds normal Extremities- no edema Lymph-no adenopathy palpable Musculoskeletal-no joint tenderness, deformity or swelling Skin-warm and dry, no hyperpigmentation, vitiligo, or suspicious lesions  Neurological Examination Mental Status: Alert, oriented, thought content appropriate.  Speech fluent without evidence of aphasia.  Able to follow 3 step commands without difficulty. Cranial Nerves: II: Visual fields grossly normal, pupils equal, round, reactive to light and accommodation III,IV, VI: ptosis not present, extra-ocular motions intact bilaterally V,VII: smile symmetric, facial light touch sensation normal bilaterally VIII: hearing normal bilaterally IX,X: uvula rises symmetrically XI: bilateral shoulder shrug XII: midline tongue extension Motor: Right : Upper extremity   5/5    Left:     Upper extremity   5/5  Lower extremity   5/5     Lower extremity   5/5 --drift in right arm and right leg Tone and bulk:normal tone throughout; no atrophy noted Sensory: Pinprick and light touch intact throughout, bilaterally Deep Tendon Reflexes: 2+ and symmetric throughout Plantars: Right: downgoing   Left: downgoing Cerebellar: normal finger-to-nose and normal heel-to-shin test Gait:  deferred for PT       Lab Results: Basic Metabolic Panel:  Recent Labs Lab 04/03/15 2004 04/04/15 0431 04/04/15 0721  NA 145 144 143  K 3.7 3.5 3.4*  CL 107 106 108  CO2 22  --  23  GLUCOSE 141* 125* 133*  BUN 28* 26* 20  CREATININE 1.05* 0.90 0.86  CALCIUM 9.5  --  9.2    Liver Function Tests:  Recent Labs Lab 04/03/15 2004 04/04/15 0721  AST 31 19  ALT 16 16  ALKPHOS 171* 152*  BILITOT 0.7 0.4  PROT 7.3 7.0  ALBUMIN 3.8 3.5   No results for input(s): LIPASE, AMYLASE in the last 168 hours. No results for input(s): AMMONIA in the last 168  hours.  CBC:  Recent Labs Lab 04/03/15 2004 04/04/15 0431 04/04/15 0451 04/04/15 0721  WBC 5.5  --  5.1 5.0  NEUTROABS 3.5  --  2.9  --   HGB 12.0 12.2 11.2* 11.8*  HCT 38.3 36.0 36.3 38.1  MCV 93.6  --  93.1 93.4  PLT 200  --  170 170    Cardiac Enzymes: No results for input(s): CKTOTAL, CKMB, CKMBINDEX, TROPONINI in the last 168 hours.  Lipid Panel:  Recent Labs Lab 04/04/15 0721  CHOL 216*  TRIG 75  HDL 65  CHOLHDL 3.3  VLDL 15  LDLCALC 136*    CBG:  Recent Labs Lab 04/04/15 0811  GLUCAP 128*    Microbiology: Results for orders placed or performed during the hospital encounter of 12/09/13  MRSA PCR Screening     Status: None   Collection Time: 12/10/13  6:28 AM  Result Value Ref Range Status   MRSA by PCR NEGATIVE NEGATIVE Final    Comment:        The GeneXpert MRSA Assay (FDA approved for NASAL specimens only), is one component of a comprehensive MRSA colonization surveillance program. It is not intended to diagnose MRSA infection nor to guide or monitor treatment for MRSA infections.     Coagulation Studies:  Recent Labs  04/04/15 0451  LABPROT 13.8  INR 1.04    Imaging: Mr Brain Wo Contrast  04/04/2015  CLINICAL DATA:  Single episode of dizziness yesterday evening. Mild headache, weakness. History of hypertension, diabetes, breast cancer. EXAM: MRI HEAD WITHOUT CONTRAST TECHNIQUE: Multiplanar, multiecho pulse sequences of the brain and surrounding structures were obtained without intravenous contrast. COMPARISON:  CT head March 02, 2007 FINDINGS: Wedge-like reduced diffusion RIGHT inferior cerebellum. Corresponding low ADC values and T2 bright signal. No susceptibility artifact to suggest hemorrhage. Ventricles and sulci are normal for patient's age. Patchy to confluent supratentorial and pontine white matter FLAIR T2 hyperintensities without midline shift, mass effect or masses. No abnormal extra-axial fluid collections. Major  intracranial vascular flow voids present at the skullbase. Status post RIGHT ocular lens implant. Small LEFT maxillary mucosal retention cyst. Mastoid air cells demonstrate trace effusion. No abnormal sellar expansion. No cerebellar tonsillar ectopia. No suspicious calvarial bone marrow signal. Patient is edentulous. IMPRESSION: Acute RIGHT posterior inferior cerebellar artery territory infarct. Moderate chronic small vessel ischemic disease. Involutional changes. Electronically Signed   By: Elon Alas M.D.   On: 04/04/2015 03:44   Mr Mra Head/brain Wo Cm  04/04/2015  CLINICAL DATA:  82 year old female with acute onset dizziness. Acute right PICA infarct on MRI earlier today. Initial encounter. EXAM: MRA HEAD WITHOUT CONTRAST TECHNIQUE: Angiographic images of the Circle of Willis were obtained using MRA technique without intravenous contrast. COMPARISON:  Brain MRI 0310 hours today. FINDINGS: Antegrade flow in the posterior circulation with mildly dominant distal right vertebral artery. The right PICA origin is patent, however about 8 mm beyond its origin there is near occlusion of the vessel (series 305, image 10) with a 4-5 mm segment of loss flow signal. There is preserved distal flow. No distal vertebral artery irregularity. Normal left PICA origin. Normal vertebrobasilar junction. No basilar stenosis. Normal SCA and PCA origins. Posterior communicating arteries are diminutive or absent. Normal bilateral PCA branches. Antegrade flow in both ICA siphons. 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 Ophthalmic artery origins are within normal limits. Patent carotid termini. Normal MCA and ACA origins. Anterior communicating artery and visualized ACA branches are within normal limits. Bilateral MCA M1 segments, MCA bifurcations, and visualized bilateral MCA branches are within normal limits. Abnormal signal in the posterior inferior right cerebellum corresponding  to the infarcts seen on MRI.  No posterior fossa mass effect is evident. IMPRESSION: 1. Negative distal vertebral arteries and right PICA origin is patent but there is a High-grade lesion in the proximal right PICA (series 305, image 10) concordant with the MRI findings. 2. Otherwise negative posterior circulation. Negative anterior circulation aside from ICA siphon dolichoectasia. Electronically Signed   By: Genevie Ann M.D.   On: 04/04/2015 09:40       Assessment and plan discussed with with attending physician and they are in agreement.    Etta Quill PA-C [facility] [phone]  04/04/2015, 10:46 AM   Assessment: 82 y.o. female with many months of dealing with vertigo and imbalance. Yesterday she has they same symptoms but more sever. While in ED she was found to have a right PICA distribution infarct with a sever stenosis in the proximal right PICA.   Stroke Risk Factors - diabetes mellitus and hypertension   1. HgbA1c, 3. PT consult, OT consult, Speech consult 4. Echocardiogram 5. CTA neck 6. Prophylactic therapy-Antiplatelet med: Aspirin - dose 325 mg daily 7. Risk factor modification 8. Telemetry monitoring 9. Frequent neuro checks 10 NPO until passes stroke swallow screen 11 please page stroke NP  Or  PA  Or MD  M-F from 8am -4 pm starting 3.15.2017 pt will be  followed by the stroke team at this point.   You can look them up on [email or web address]  Password [email or web address]

## 2015-04-04 NOTE — ED Notes (Signed)
Pt taken to MRI  

## 2015-04-04 NOTE — Progress Notes (Signed)
Report received from [facility] for admission to [phone]

## 2015-04-04 NOTE — ED Notes (Addendum)
Patient handed a pitcher of ice water with a cup, lid and a straw; patient is eating her breakfast tray at this time; visitor at bedside

## 2015-04-04 NOTE — ED Notes (Signed)
Pt to ct 

## 2015-04-04 NOTE — ED Provider Notes (Signed)
CSN: [id number]     Arrival date & time 04/03/15  1930 History   By signing my name below, I, Rowan Blase, attest that this documentation has been prepared under the direction and in the presence of Everlene Balls, MD . Electronically Signed: Rowan Blase, Scribe. 04/04/2015. 1:12 AM.   Chief Complaint  Patient presents with  . Dizziness   The history is provided by the patient. No language interpreter was used.   HPI Comments:  Kendra Maldonado is a 82 y.o. female with PMHx of DM, vertigo, and HTN who presents to the Emergency Department via EMS complaining of one episode of dizziness yesterday ~1730 pm. Pt states she almost fell but was able to get to her walker. Pt reports associated current mild headache, weakness and dizziness when standing up. Pt took Meclizine with no relief. Pt denies headache at onset of dizziness, vomiting, numbness, or  h/o strokes.  Past Medical History  Diagnosis Date  . Diabetes mellitus type 2, uncontrolled ([place])   . Essential hypertension   . Chronic diarrhea   . Back pain   . Hiatal hernia     Stenosis of the spine  . Breast cancer, left breast ([place]) 2010    lumpectomy   Past Surgical History  Procedure Laterality Date  . Esophagogastroduodenoscopy (egd) with propofol N/A 12/10/2013    Procedure: ESOPHAGOGASTRODUODENOSCOPY (EGD) WITH PROPOFOL;  Surgeon: Jeryl Columbia, MD;  Location: [facility] ENDOSCOPY;  Service: Endoscopy;  Laterality: N/A;   Family History  Problem Relation Age of Onset  . Hypertension Mother    Social History  Substance Use Topics  . Smoking status: Never Smoker   . Smokeless tobacco: Never Used  . Alcohol Use: No   OB History    No data available     Review of Systems  Gastrointestinal: Negative for vomiting.  Neurological: Positive for dizziness, weakness and headaches. Negative for numbness.  All other systems reviewed and are negative.  Allergies  Codeine and Demerol  Home Medications   Prior to Admission  medications   Medication Sig Start Date End Date Taking? Authorizing Provider  amLODipine (NORVASC) 5 MG tablet Take 5 mg by mouth daily. 03/25/15  Yes Historical Provider, MD  Cholecalciferol (D 2000) 2000 UNITS TABS Take 2,000 Units by mouth daily.   Yes Historical Provider, MD  diphenoxylate-atropine (LOMOTIL) 2.5-0.025 MG per tablet Take 2 tablets by mouth daily as needed for diarrhea or loose stools.  09/20/13  Yes Historical Provider, MD  ferrous sulfate 325 (65 FE) MG tablet Take 1 tablet (325 mg total) by mouth daily with breakfast. Patient taking differently: Take 325 mg by mouth 2 (two) times daily with a meal.  12/11/13  Yes Sid Falcon, MD  meclizine (ANTIVERT) 25 MG tablet Take 25 mg by mouth as needed for dizziness.  03/16/15  Yes Historical Provider, MD  metFORMIN (GLUCOPHAGE) 500 MG tablet Take 500 mg by mouth 2 (two) times daily. 10/07/13  Yes Historical Provider, MD  omeprazole (PRILOSEC) 20 MG capsule Take 20 mg by mouth daily.   Yes Historical Provider, MD   BP 166/76 mmHg  Pulse 72  Temp(Src) 97.8 F (36.6 C) (Oral)  Resp 15  Ht 5' 7.5" (1.715 m)  Wt 165 lb (74.844 kg)  BMI 25.45 kg/m2  SpO2 98% Physical Exam  Constitutional: She is oriented to person, place, and time. She appears well-developed and well-nourished. No distress.  HENT:  Head: Normocephalic and atraumatic.  Nose: Nose normal.  Mouth/Throat: Oropharynx is  clear and moist. No oropharyngeal exudate.  Eyes: Conjunctivae and EOM are normal. Pupils are equal, round, and reactive to light. No scleral icterus.  Neck: Normal range of motion. Neck supple. No JVD present. No tracheal deviation present. No thyromegaly present.  Cardiovascular: Normal rate, regular rhythm and normal heart sounds.  Exam reveals no gallop and no friction rub.   No murmur heard. Pulmonary/Chest: Effort normal and breath sounds normal. No respiratory distress. She has no wheezes. She exhibits no tenderness.  Abdominal: Soft. Bowel  sounds are normal. She exhibits no distension and no mass. There is no tenderness. There is no rebound and no guarding.  Musculoskeletal: Normal range of motion. She exhibits no edema or tenderness.  Lymphadenopathy:    She has no cervical adenopathy.  Neurological: She is alert and oriented to person, place, and time. No cranial nerve deficit. She exhibits normal muscle tone.  Normal strength and sensation in all extremities; normal cerebellar testing  Skin: Skin is warm and dry. No rash noted. No erythema. No pallor.  Nursing note and vitals reviewed.   ED Course  Procedures  DIAGNOSTIC STUDIES:  Oxygen Saturation is 99% on RA, normal by my interpretation.    COORDINATION OF CARE:  1:08 AM Will order MRI to r/o stroke, blood work, and UA. Discussed treatment plan with pt at bedside and pt agreed to plan.  Labs Review Labs Reviewed  COMPREHENSIVE METABOLIC PANEL - Abnormal; Notable for the following:    Glucose, Bld 141 (*)    BUN 28 (*)    Creatinine, Ser 1.05 (*)    Alkaline Phosphatase 171 (*)    GFR calc non Af Amer 49 (*)    GFR calc Af Amer 57 (*)    Anion gap 16 (*)    All other components within normal limits  URINALYSIS, ROUTINE W REFLEX MICROSCOPIC (NOT AT [facility]) - Abnormal; Notable for the following:    Protein, ur 30 (*)    All other components within normal limits  URINE MICROSCOPIC-ADD ON - Abnormal; Notable for the following:    Squamous Epithelial / LPF 0-5 (*)    Bacteria, UA RARE (*)    Crystals CA OXALATE CRYSTALS (*)    All other components within normal limits  CBC WITH DIFFERENTIAL/PLATELET  ETHANOL  PROTIME-INR  APTT  CBC  DIFFERENTIAL  URINE RAPID DRUG SCREEN, HOSP PERFORMED  I-STAT CHEM 8, ED  Randolm Idol, ED    Imaging Review Mr Brain Wo Contrast  04/04/2015  CLINICAL DATA:  Single episode of dizziness yesterday evening. Mild headache, weakness. History of hypertension, diabetes, breast cancer. EXAM: MRI HEAD WITHOUT CONTRAST  TECHNIQUE: Multiplanar, multiecho pulse sequences of the brain and surrounding structures were obtained without intravenous contrast. COMPARISON:  CT head March 02, 2007 FINDINGS: Wedge-like reduced diffusion RIGHT inferior cerebellum. Corresponding low ADC values and T2 bright signal. No susceptibility artifact to suggest hemorrhage. Ventricles and sulci are normal for patient's age. Patchy to confluent supratentorial and pontine white matter FLAIR T2 hyperintensities without midline shift, mass effect or masses. No abnormal extra-axial fluid collections. Major intracranial vascular flow voids present at the skullbase. Status post RIGHT ocular lens implant. Small LEFT maxillary mucosal retention cyst. Mastoid air cells demonstrate trace effusion. No abnormal sellar expansion. No cerebellar tonsillar ectopia. No suspicious calvarial bone marrow signal. Patient is edentulous. IMPRESSION: Acute RIGHT posterior inferior cerebellar artery territory infarct. Moderate chronic small vessel ischemic disease. Involutional changes. Electronically Signed   By: Elon Alas M.D.   On:  04/04/2015 03:44   I have personally reviewed and evaluated these images and lab results as part of my medical decision-making.   EKG Interpretation None      MDM   Final diagnoses:  None   Patient presents to the ED for dizziness.  She has a history of vertigo but present because this episode is worse and not relieved by meclizine at home.  Stroke is possible.  PE is normal but will obtain MRI for evaluation, as posterior stroke can presents with dizziness only.    MRI reveals an acute stroke.  Will admit to hospitalist and consult with neurology.  Patient informed of diagnosis.     I personally performed the services described in this documentation, which was scribed in my presence. The recorded information has been reviewed and is accurate.      Everlene Balls, MD 04/04/15 [phone]

## 2015-04-04 NOTE — ED Notes (Signed)
POCT CBG resulted 128; Santiago Glad, RN aware

## 2015-04-04 NOTE — ED Notes (Signed)
Pt cleaned of small amount of stool. Moves easily in beds.

## 2015-04-04 NOTE — Progress Notes (Signed)
Patient complaining of soft/loose bowel movements which she says "is normal and I have this at home". Patient asking for her home medication lomotil. Dr. Eliseo Squires paged.

## 2015-04-05 ENCOUNTER — Ambulatory Visit (HOSPITAL_COMMUNITY): Payer: Medicare Other

## 2015-04-05 DIAGNOSIS — I6789 Other cerebrovascular disease: Secondary | ICD-10-CM

## 2015-04-05 DIAGNOSIS — I63541 Cerebral infarction due to unspecified occlusion or stenosis of right cerebellar artery: Secondary | ICD-10-CM | POA: Insufficient documentation

## 2015-04-05 DIAGNOSIS — I639 Cerebral infarction, unspecified: Principal | ICD-10-CM

## 2015-04-05 DIAGNOSIS — E785 Hyperlipidemia, unspecified: Secondary | ICD-10-CM

## 2015-04-05 LAB — GLUCOSE, CAPILLARY
GLUCOSE-CAPILLARY: 133 mg/dL — AB (ref 65–99)
GLUCOSE-CAPILLARY: 251 mg/dL — AB (ref 65–99)
Glucose-Capillary: 124 mg/dL — ABNORMAL HIGH (ref 65–99)
Glucose-Capillary: 121 mg/dL — ABNORMAL HIGH (ref 65–99)

## 2015-04-05 LAB — HEMOGLOBIN A1C
HEMOGLOBIN A1C: 6.7 % — AB (ref 4.8–5.6)
MEAN PLASMA GLUCOSE: 146 mg/dL

## 2015-04-05 LAB — ECHOCARDIOGRAM COMPLETE
HEIGHTINCHES: 67.5 in
Weight: 2720 oz

## 2015-04-05 MED ORDER — ASPIRIN EC 81 MG PO TBEC
81.0000 mg | DELAYED_RELEASE_TABLET | Freq: Every day | ORAL | Status: DC
  Administered 2015-04-06: 81 mg via ORAL
  Filled 2015-04-05: qty 1

## 2015-04-05 MED ORDER — METOPROLOL TARTRATE 12.5 MG HALF TABLET
12.5000 mg | ORAL_TABLET | Freq: Two times a day (BID) | ORAL | Status: DC
  Administered 2015-04-05 – 2015-04-06 (×3): 12.5 mg via ORAL
  Filled 2015-04-05 (×3): qty 1

## 2015-04-05 MED ORDER — DIPHENOXYLATE-ATROPINE 2.5-0.025 MG PO TABS
2.0000 | ORAL_TABLET | Freq: Three times a day (TID) | ORAL | Status: DC | PRN
  Administered 2015-04-05 – 2015-04-06 (×3): 2 via ORAL
  Filled 2015-04-05 (×3): qty 2

## 2015-04-05 MED ORDER — ATORVASTATIN CALCIUM 40 MG PO TABS
40.0000 mg | ORAL_TABLET | Freq: Every day | ORAL | Status: DC
  Administered 2015-04-05: 40 mg via ORAL
  Filled 2015-04-05: qty 1

## 2015-04-05 MED ORDER — CLOPIDOGREL BISULFATE 75 MG PO TABS
75.0000 mg | ORAL_TABLET | Freq: Every day | ORAL | Status: DC
  Administered 2015-04-06: 75 mg via ORAL
  Filled 2015-04-05: qty 1

## 2015-04-05 NOTE — Progress Notes (Signed)
PROGRESS NOTE  Kendra Maldonado [id number] DOB: 04/18/1933 DOA: 04/04/2015 PCP: Irven Shelling, MD  Assessment/Plan: Acute cerebellar stroke -  Neurology consult High grade stenosis on MRA aspirin cautioned that patient has had previous GI bleed in 2015.  hemoglobin A1c :6.7- needs tighter control lipid panel: LDL > 70-- add statin Physical therapy consult/OT  Diabetes mellitus type 2 - while inpatient and will keep patient on sliding scale coverage and hold metformin.  Hypertension  -permissive HTN -add BB (low dose) for episode of SVT-- did not appear to be a fib-- will get EKG with next episode  History of GI bleed - on Protonix. Note that patient is on aspirin now.  Hypokalemia -replete   Code Status: *full Family Communication: patient Disposition Plan: await PT Eval   Consultants:    Procedures:     HPI/Subjective: Feeling better today No CP, no SOB  Objective: Filed Vitals:   04/05/15 0509 04/05/15 0946  BP: 165/82 153/67  Pulse: 74 78  Temp: 97.8 F (36.6 C) 98 F (36.7 C)  Resp: 20 16    Intake/Output Summary (Last 24 hours) at 04/05/15 1130 Last data filed at 04/05/15 0920  Gross per 24 hour  Intake    480 ml  Output   1750 ml  Net  -1270 ml   Filed Weights   04/03/15 2337 04/04/15 1637  Weight: 74.844 kg (165 lb) 77.111 kg (170 lb)    Exam:   General:  awake  Cardiovascular: rrr  Respiratory: clear  Abdomen: +BS, soft  Musculoskeletal: moves all 4 ext   Data Reviewed: Basic Metabolic Panel:  Recent Labs Lab 04/03/15 2004 04/04/15 0431 04/04/15 0721  NA 145 144 143  K 3.7 3.5 3.4*  CL 107 106 108  CO2 22  --  23  GLUCOSE 141* 125* 133*  BUN 28* 26* 20  CREATININE 1.05* 0.90 0.86  CALCIUM 9.5  --  9.2   Liver Function Tests:  Recent Labs Lab 04/03/15 2004 04/04/15 0721  AST 31 19  ALT 16 16  ALKPHOS 171* 152*  BILITOT 0.7 0.4  PROT 7.3 7.0  ALBUMIN 3.8 3.5   No results for input(s): LIPASE,  AMYLASE in the last 168 hours. No results for input(s): AMMONIA in the last 168 hours. CBC:  Recent Labs Lab 04/03/15 2004 04/04/15 0431 04/04/15 0451 04/04/15 0721  WBC 5.5  --  5.1 5.0  NEUTROABS 3.5  --  2.9  --   HGB 12.0 12.2 11.2* 11.8*  HCT 38.3 36.0 36.3 38.1  MCV 93.6  --  93.1 93.4  PLT 200  --  170 170   Cardiac Enzymes: No results for input(s): CKTOTAL, CKMB, CKMBINDEX, TROPONINI in the last 168 hours. BNP (last 3 results) No results for input(s): BNP in the last 8760 hours.  ProBNP (last 3 results) No results for input(s): PROBNP in the last 8760 hours.  CBG:  Recent Labs Lab 04/04/15 0811 04/04/15 1216 04/04/15 1747 04/04/15 2145 04/05/15 0753  GLUCAP 128* 200* 132* 141* 124*    No results found for this or any previous visit (from the past 240 hour(s)).   Studies: Ct Angio Neck W/cm &/or Wo/cm  04/04/2015  CLINICAL DATA:  82 year old diabetic hypertensive female with dizziness and acute right cerebellar infarct. Subsequent encounter. EXAM: CT ANGIOGRAPHY NECK TECHNIQUE: Multidetector CT imaging of the neck was performed using the standard protocol during bolus administration of intravenous contrast. Multiplanar CT image reconstructions and MIPs were obtained to evaluate the  vascular anatomy. Carotid stenosis measurements (when applicable) are obtained utilizing NASCET criteria, using the distal internal carotid diameter as the denominator. CONTRAST:  42mL OMNIPAQUE IOHEXOL 350 MG/ML SOLN COMPARISON:  None. FINDINGS: Aortic arch: 3 vessel arch with plaque and mild narrowing proximal left subclavian artery. Right carotid system: Ectatic right common carotid artery. Minimal plaque carotid bifurcation and proximal right internal carotid artery without measurable stenosis. Left carotid system: Ectatic left common carotid artery. Plaque left carotid bifurcation without significant stenosis of the left internal carotid artery. Vertebral arteries:Right  vertebral artery is dominant. Plaque with mild narrowing proximal right vertebral artery and distal right vertebral artery without high-grade stenosis. High-grade stenosis proximal right posterior inferior cerebellar artery corresponding to patient's acute infarct. Ectatic proximal left vertebral artery. Skeleton: Cervical spondylotic changes most notable C4-5. Other neck: No worrisome primary neck mass or adenopathy. Calcification palatine tonsils consistent with prior inflammation. Impression upon the right posterior wall aspect of the pharynx by ectatic right carotid artery. Upper chest: No worrisome lung apical mass. Chronic lung changes. Post left axillary lymph node dissection. IMPRESSION: Right vertebral artery is dominant. Plaque with mild narrowing proximal right vertebral artery and distal right vertebral artery without high-grade stenosis. High-grade stenosis proximal right posterior inferior cerebellar artery corresponding to patient's acute infarct. Ectatic proximal left vertebral artery without significant narrowing. Mild plaque carotid bifurcation bilaterally with less than 50% diameter stenosis of the proximal internal carotid arteries. Electronically Signed   By: Genia Del M.D.   On: 04/04/2015 15:38   Mr Brain Wo Contrast  04/04/2015  CLINICAL DATA:  Single episode of dizziness yesterday evening. Mild headache, weakness. History of hypertension, diabetes, breast cancer. EXAM: MRI HEAD WITHOUT CONTRAST TECHNIQUE: Multiplanar, multiecho pulse sequences of the brain and surrounding structures were obtained without intravenous contrast. COMPARISON:  CT head March 02, 2007 FINDINGS: Wedge-like reduced diffusion RIGHT inferior cerebellum. Corresponding low ADC values and T2 bright signal. No susceptibility artifact to suggest hemorrhage. Ventricles and sulci are normal for patient's age. Patchy to confluent supratentorial and pontine white matter FLAIR T2 hyperintensities without midline shift,  mass effect or masses. No abnormal extra-axial fluid collections. Major intracranial vascular flow voids present at the skullbase. Status post RIGHT ocular lens implant. Small LEFT maxillary mucosal retention cyst. Mastoid air cells demonstrate trace effusion. No abnormal sellar expansion. No cerebellar tonsillar ectopia. No suspicious calvarial bone marrow signal. Patient is edentulous. IMPRESSION: Acute RIGHT posterior inferior cerebellar artery territory infarct. Moderate chronic small vessel ischemic disease. Involutional changes. Electronically Signed   By: Elon Alas M.D.   On: 04/04/2015 03:44   Mr Mra Head/brain Wo Cm  04/04/2015  CLINICAL DATA:  82 year old female with acute onset dizziness. Acute right PICA infarct on MRI earlier today. Initial encounter. EXAM: MRA HEAD WITHOUT CONTRAST TECHNIQUE: Angiographic images of the Circle of Willis were obtained using MRA technique without intravenous contrast. COMPARISON:  Brain MRI 0310 hours today. FINDINGS: Antegrade flow in the posterior circulation with mildly dominant distal right vertebral artery. The right PICA origin is patent, however about 8 mm beyond its origin there is near occlusion of the vessel (series 305, image 10) with a 4-5 mm segment of loss flow signal. There is preserved distal flow. No distal vertebral artery irregularity. Normal left PICA origin. Normal vertebrobasilar junction. No basilar stenosis. Normal SCA and PCA origins. Posterior communicating arteries are diminutive or absent. Normal bilateral PCA branches. Antegrade flow in both ICA siphons. 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 Ophthalmic artery origins are within normal  limits. Patent carotid termini. Normal MCA and ACA origins. Anterior communicating artery and visualized ACA branches are within normal limits. Bilateral MCA M1 segments, MCA bifurcations, and visualized bilateral MCA branches are within normal limits. Abnormal signal in the  posterior inferior right cerebellum corresponding to the infarcts seen on MRI. No posterior fossa mass effect is evident. IMPRESSION: 1. Negative distal vertebral arteries and right PICA origin is patent but there is a High-grade lesion in the proximal right PICA (series 305, image 10) concordant with the MRI findings. 2. Otherwise negative posterior circulation. Negative anterior circulation aside from ICA siphon dolichoectasia. Electronically Signed   By: Genevie Ann M.D.   On: 04/04/2015 09:40    Scheduled Meds: . amLODipine  5 mg Oral Daily  . aspirin  300 mg Rectal Daily   Or  . aspirin  325 mg Oral Daily  . atorvastatin  40 mg Oral q1800  . cholecalciferol  2,000 Units Oral Daily  . enoxaparin (LOVENOX) injection  40 mg Subcutaneous Q24H  . ferrous sulfate  325 mg Oral BID WC  . insulin aspart  0-9 Units Subcutaneous TID WC  . metoprolol tartrate  12.5 mg Oral BID  . pantoprazole  40 mg Oral Daily   Continuous Infusions:  Antibiotics Given (last 72 hours)    None      Principal Problem:   Cerebellar stroke, acute ([place]) Active Problems:   Stroke ([place])   Hypertension   Stroke (cerebrum) ([facility])   Type 2 diabetes mellitus with vascular disease ([place])    Time spent: 25 min    [facility] Pager [phone]. If 7PM-7AM, please contact night-coverage at [email or web address], password [facility] 04/05/2015, 11:30 AM  LOS: 1 day

## 2015-04-05 NOTE — Evaluation (Signed)
Occupational Therapy Evaluation Patient Details Name: IBTISAM DUERST MRN: [id number] DOB: 1933-08-09 Today's Date: 04/05/2015    History of Present Illness Pt is a 82 y/o female whom lives alone, has a history of diabetes mellitus, hypertension and previous history of GI bleeding 2015 presents to the ER on 04/04/15 because of persistent dizziness. Per MRI, pt was diagnosed w/ acute cerebellar stroke.   Clinical Impression   Pt was admitted as above. She currently presents w/ bilateral UE generalized weakness, decreased coordination and is supervision level for functional transfers related to ADL's and self care tasks. No family present during assessment to assist with baseline level of function/cognition (this was difficult to assess as pt had difficulty following commands at times). Pt has family nearby but lives alone and plans to d/c home. She is open to [facility] although she should benefit from out-pt neuro rehab for out-pt therapy to address deficits in coordination, bilateral UE strength and further assess cognition during functional tasks. She states that transportation would be difficult for her. Will follow acutely.    Follow Up Recommendations  Other (comment);Supervision - Intermittent (Recommend Out-pt Neuro Rehab however pt states that transporation would be difficult for her, if she can't go to out-pt neuro, recommend HHOT)    Equipment Recommendations  Tub/shower bench    Recommendations for Other Services       Precautions / Restrictions Precautions Precautions: Fall Restrictions Weight Bearing Restrictions: No      Mobility Bed Mobility Overal bed mobility: Modified Independent                Transfers Overall transfer level: Needs assistance Equipment used: Rolling walker (2 wheeled) Transfers: Sit to/from Stand Sit to Stand: Supervision         General transfer comment: VC's for RW safety and hand placement    Balance Overall balance assessment:  Needs assistance Sitting-balance support: No upper extremity supported;Feet supported Sitting balance-Leahy Scale: Poor Sitting balance - Comments: Pt with initial good sitting balance at EOB followed by posterior lean in sitting and brining feet off the ground during MMT of UE's. Pt required verbal, tactile and visual cues to correct and appeared unaware of significant posterior lean. Postural control: Posterior lean Standing balance support: Bilateral upper extremity supported;No upper extremity supported;During functional activity Standing balance-Leahy Scale: Fair Standing balance comment: Pt static standing balance at sink w/ RW                            ADL Overall ADL's : Needs assistance/impaired     Grooming: Wash/dry hands;Wash/dry face;Standing;Supervision/safety;Cueing for safety;Cueing for sequencing Grooming Details (indicate cue type and reason): VC's/TC's for safety with RW. Pt noted to place RW in front of sink and lean/reach around side of sink to wash hands asfter using 3:1.  Upper Body Bathing: Modified independent;Sitting   Lower Body Bathing: Supervison/ safety;Sit to/from stand;Cueing for safety   Upper Body Dressing : Modified independent;Sitting   Lower Body Dressing: Supervision/safety;Sit to/from stand   Toilet Transfer: Supervision/safety;BSC;RW;Ambulation Armed forces technical officer Details (indicate cue type and reason): VC's anad tc's for safety with RW as well as hand placement Toileting- Clothing Manipulation and Hygiene: Supervision/safety;Sit to/from stand;Cueing for safety       Functional mobility during ADLs: Supervision/safety;Cueing for safety;Rolling walker General ADL Comments: Pt was assessed and educated in role of OT. She currently presents w/ bilateral UE generalized weakness, decreased coordination and is supervision level for functional transfers related to ADL's  and self care tasks. No family present during assessment to assist with  baseline level of function/cognition (this was difficult to assess as pt had difficulty following commands at times). Pt participated in ADL retraining session for toileting, grooming and functional transfers using RW. Pt has family nearby but lives alone and plans to d/c home alone. She is open to [facility] although she should benefit from out-pt neuro rehab for out-pt therapy to address deficits in coordination, bilateral UE strength and further assess cognition during functional tasks. She states that transportation would be difficult for her.     Vision  No change from baseline; Wears glasses for driving and reading   Perception     Praxis      Pertinent Vitals/Pain Pain Assessment: No/denies pain     Hand Dominance Right   Extremity/Trunk Assessment Upper Extremity Assessment Upper Extremity Assessment: RUE deficits/detail;LUE deficits/detail RUE Coordination: decreased fine motor LUE Coordination: decreased fine motor   Lower Extremity Assessment Lower Extremity Assessment: Defer to PT evaluation       Communication Communication Communication: No difficulties   Cognition Arousal/Alertness: Awake/alert Behavior During Therapy:  (Upon initial assessment, pt appears WFL's - see below) Overall Cognitive Status: No family/caregiver present to determine baseline cognitive functioning (Pt w/ difficulty following commands, repeating questions. Not sure of baseline level and no family present to verify) Area of Impairment: Attention;Following commands;Problem solving     Memory: Decreased short-term memory Following Commands: Follows one step commands inconsistently     Problem Solving: Slow processing;Decreased initiation;Requires verbal cues;Requires tactile cues;Difficulty sequencing     General Comments       Exercises       Shoulder Instructions      Home Living Family/patient expects to be discharged to:: Private residence Living Arrangements: Alone Available Help  at Discharge: Family Type of Home: House Home Access: Stairs to enter Technical brewer of Steps: 3  Entrance Stairs-Rails: Can reach both Home Layout: One level     Bathroom Shower/Tub: Tub/shower unit Shower/tub characteristics: Architectural technologist: Standard     Home Equipment: Bedside commode;Walker - 4 wheels;Cane - single point;Shower seat          Prior Functioning/Environment               OT Diagnosis: Generalized weakness;Cognitive deficits   OT Problem List: Decreased strength;Decreased coordination;Decreased cognition;Impaired balance (sitting and/or standing);Decreased knowledge of use of DME or AE;Impaired UE functional use   OT Treatment/Interventions: Self-care/ADL training;Therapeutic exercise;Therapeutic activities;DME and/or AE instruction;Patient/family education;Cognitive remediation/compensation;Neuromuscular education    OT Goals(Current goals can be found in the care plan section) Acute Rehab OT Goals Patient Stated Goal: Go home Time For Goal Achievement: 04/19/15 Potential to Achieve Goals: Good ADL Goals Pt Will Perform Grooming: with modified independence;sitting;standing Pt Will Perform Lower Body Bathing: with modified independence;sit to/from stand Pt Will Perform Lower Body Dressing: with modified independence;sit to/from stand Pt Will Transfer to Toilet: with modified independence;ambulating;bedside commode Pt Will Perform Toileting - Clothing Manipulation and hygiene: with modified independence;sitting/lateral leans;sit to/from stand Pt Will Perform Tub/Shower Transfer: Tub transfer;with supervision;ambulating;3 in 1;rolling walker Pt/caregiver will Perform Home Exercise Program: With written HEP provided;Both right and left upper extremity;Independently (Coordination and general AROM/theraband ex's)  OT Frequency: Min 2X/week   Barriers to D/C: Other (comment)  Lives alone - washer/dryer in basement. Pt states that family can  assist with laundry if needed       Co-evaluation  End of Session Equipment Utilized During Treatment: Gait belt;Rolling walker  Activity Tolerance: Patient tolerated treatment well Patient left: in bed;with call bell/phone within reach;with bed alarm set   Time: [id number] OT Time Calculation (min): 24 min Charges:  OT General Charges $OT Visit: 1 Procedure OT Evaluation $OT Eval Moderate Complexity: 1 Procedure OT Treatments $Self Care/Home Management : 8-22 mins G-Codes:    Almyra Deforest, OTR/L 04/05/2015, 12:23 PM

## 2015-04-05 NOTE — Progress Notes (Addendum)
UR COMPLETED  

## 2015-04-05 NOTE — Progress Notes (Addendum)
STROKE TEAM PROGRESS NOTE   HISTORY OF PRESENT ILLNESS Kendra Maldonado is an 82 y.o. female who states she has been suffering from vertigo for the past 3 months and has been on meclizine. Per husband she will fall to the right and the symptoms seem to be intermittent. It has become bad enough she bought a walker for the vertigo events. Yesterday she came to the ED due to the inability to ambulate which was worse than prior. MRI was obtained showing a right cerebellar infarct.Her last known well is unknown. Patient was not administered IV t-PA secondary to unknown last known well. She was admitted for further evaluation and treatment.   SUBJECTIVE (INTERVAL HISTORY) No family is at the bedside.  Overall she feels her condition is completely resolved. However, she stated that for the last 2-3 months, she had intermittent mild vertigo and intermittent lightheadedness lasting 4-5 min, not that bad, was put on meclizine. However, Monday she had really bad vertigo, not able to walk or get up and had to use life alert. MRI showed right PICA stroke and MRA showed right PICA high grade stenosis. She denies any heart racing or palpitation.    OBJECTIVE Temp:  [97.7 F (36.5 C)-98.2 F (36.8 C)] 98 F (36.7 C) (03/15 0946) Pulse Rate:  [74-93] 78 (03/15 0946) Cardiac Rhythm:  [-] Normal sinus rhythm (03/14 2144) Resp:  [16-27] 16 (03/15 0946) BP: (134-177)/(67-104) 153/67 mmHg (03/15 0946) SpO2:  [93 %-100 %] 97 % (03/15 0946) Weight:  [77.111 kg (170 lb)] 77.111 kg (170 lb) (03/14 1637)  CBC:  Recent Labs Lab 04/03/15 2004  04/04/15 0451 04/04/15 0721  WBC 5.5  --  5.1 5.0  NEUTROABS 3.5  --  2.9  --   HGB 12.0  < > 11.2* 11.8*  HCT 38.3  < > 36.3 38.1  MCV 93.6  --  93.1 93.4  PLT 200  --  170 170  < > = values in this interval not displayed.  Basic Metabolic Panel:  Recent Labs Lab 04/03/15 2004 04/04/15 0431 04/04/15 0721  NA 145 144 143  K 3.7 3.5 3.4*  CL 107 106 108  CO2 22   --  23  GLUCOSE 141* 125* 133*  BUN 28* 26* 20  CREATININE 1.05* 0.90 0.86  CALCIUM 9.5  --  9.2    Lipid Panel:    Component Value Date/Time   CHOL 216* 04/04/2015 0721   TRIG 75 04/04/2015 0721   HDL 65 04/04/2015 0721   CHOLHDL 3.3 04/04/2015 0721   VLDL 15 04/04/2015 0721   LDLCALC 136* 04/04/2015 0721   HgbA1c:  Lab Results  Component Value Date   HGBA1C 6.7* 04/04/2015   Urine Drug Screen:    Component Value Date/Time   LABOPIA NONE DETECTED 04/04/2015 0215   COCAINSCRNUR NONE DETECTED 04/04/2015 0215   LABBENZ NONE DETECTED 04/04/2015 0215   AMPHETMU NONE DETECTED 04/04/2015 0215   THCU NONE DETECTED 04/04/2015 0215   LABBARB NONE DETECTED 04/04/2015 0215      IMAGING I have personally reviewed the radiological images below and agree with the radiology interpretations.  Ct Angio Neck W/cm &/or Wo/cm  04/04/2015   Right vertebral artery is dominant. Plaque with mild narrowing proximal right vertebral artery and distal right vertebral artery without high-grade stenosis. High-grade stenosis proximal right posterior inferior cerebellar artery corresponding to patient's acute infarct. Ectatic proximal left vertebral artery without significant narrowing. Mild plaque carotid bifurcation bilaterally with less than 50% diameter stenosis  of the proximal internal carotid arteries.   Mr Brain Wo Contrast  04/04/2015   Acute RIGHT posterior inferior cerebellar artery territory infarct. Moderate chronic small vessel ischemic disease. Involutional changes.   Mr Jodene Nam Head/brain Wo Cm  04/04/2015   1. Negative distal vertebral arteries and right PICA origin is patent but there is a High-grade lesion in the proximal right PICA (series 305, image 10) concordant with the MRI findings. 2. Otherwise negative posterior circulation. Negative anterior circulation aside from ICA siphon dolichoectasia.   2D echo - - Left ventricle: The cavity size was normal. There was mild  concentric  hypertrophy. Systolic function was normal. The  estimated ejection fraction was in the range of 60% to 65%. Wall  motion was normal; there were no regional wall motion  abnormalities. Features are consistent with a pseudonormal left  ventricular filling pattern, with concomitant abnormal relaxation  and increased filling pressure (grade 2 diastolic dysfunction).  Doppler parameters are consistent with high ventricular filling  pressure. - Aortic valve: Transvalvular velocity was within the normal range.  There was no stenosis. There was no regurgitation. - Mitral valve: Calcified annulus. Transvalvular velocity was  within the normal range. There was no evidence for stenosis.  There was mild regurgitation. - Left atrium: The atrium was severely dilated. - Right ventricle: The cavity size was normal. Wall thickness was  normal. Systolic function was normal. - Tricuspid valve: There was trivial regurgitation. - Pulmonic valve: There was mild regurgitation. - Pulmonary arteries: Systolic pressure was mildly increased. PA  peak pressure: 34 mm Hg (S). - Inferior vena cava: The vessel was normal in size. The  respirophasic diameter changes were in the normal range (>= 50%),  consistent with normal central venous pressure. - Pericardium, extracardiac: A trivial pericardial effusion was  identified posterior to the heart. Features were not consistent  with tamponade physiology.   PHYSICAL EXAM Physical exam  Temp:  [97.7 F (36.5 C)-98.2 F (36.8 C)] 97.8 F (36.6 C) (03/15 1443) Pulse Rate:  [73-93] 73 (03/15 1443) Resp:  [16-20] 19 (03/15 1443) BP: (153-177)/(67-95) 154/82 mmHg (03/15 1443) SpO2:  [93 %-100 %] 100 % (03/15 1443)  General - Well nourished, well developed, in no apparent distress.  Ophthalmologic - Sharp disc margins OU.   Cardiovascular - Regular rate and rhythm.  Mental Status -  Level of arousal and orientation to time, place, and person  were intact. Language including expression, naming, repetition, comprehension was assessed and found intact. Fund of Knowledge was assessed and was intact.  Cranial Nerves II - XII - II - Visual field intact OU. III, IV, VI - Extraocular movements intact. V - Facial sensation intact bilaterally. VII - Facial movement intact bilaterally. VIII - Hearing & vestibular intact bilaterally. X - Palate elevates symmetrically. XI - Chin turning & shoulder shrug intact bilaterally. XII - Tongue protrusion intact.  Motor Strength - The patient's strength was normal in all extremities and pronator drift was absent.  Bulk was normal and fasciculations were absent.   Motor Tone - Muscle tone was assessed at the neck and appendages and was normal.  Reflexes - The patient's reflexes were 1+ in all extremities and she had no pathological reflexes.  Sensory - Light touch, temperature/pinprick were assessed and were symmetrical.    Coordination - The patient had normal movements in the hands and feet with no ataxia or dysmetria.  Tremor was absent.  Gait and Station - deferred to PT   ASSESSMENT/PLAN Ms. Clearence Ped  Depolo is a 82 y.o. female with history of diabetes mellitus, hypertension and previous history of GI bleeding 2015 presenting with dizziness x 3 months. She did not receive IV t-PA due to unknown last known well.   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 Etiology not certain, small vessel disease vs. embolic  Resultant  Vertigo resolved.  MRI  R PICA infarct  MRA  R PICA proximal high grade stenosis  CT angio neck  High grade R PICA proximal stenosis  2D Echo  EF 60-65%  Recommend 30 day cardiac event monitoring to rule out afib  LDL 136  HgbA1c 6.7  Lovenox 40 mg sq daily for VTE prophylaxis  Diet heart healthy/carb modified Room service appropriate?: Yes; Fluid consistency:: Thin  No antithrombotic prior to admission, now on aspirin 325 mg  daily. Due to right PICA high grade stenosis as well as intermittent vertigo dizziness for the last 2 months, we recommend DAPT with ASA 81mg  and plavix 75mg  for 3 months and then plavix alone.   Patient counseled to be compliant with her antithrombotic medications  Ongoing aggressive stroke risk factor management  Therapy recommendations:  OT (OP or HH, pt reports she has difficulty with transportation)  Disposition:  pending  Hypertension  Stable  Home meds - norvasc  Permissive hypertension (OK if < 220/120) but gradually normalize in 5-7 days  Hyperlipidemia  Home meds:  No statin  LDL 136, goal < 70  Now on lipitor 40   Continue statin at discharge  Diabetes  HgbA1c 6.7, at goal < 7.0  Controlled  On metformin at home  Other Stroke Risk Factors  Advanced age  Other Active Problems  Hx GIB 2015 on protonix now  Hypokalemia  Hx L breast cancer 20 years ago - no recurrence  Hospital day # 1  Neurology will sign off. Please call with questions. Pt will follow up with Dr. Erlinda Hong at [facility] in about 2 months. Thanks for the consult.  Rosalin Hawking, MD PhD Stroke Neurology 04/05/2015 5:54 PM      To contact Stroke Continuity provider, please refer to [email or web address] After hours, contact General Neurology

## 2015-04-05 NOTE — Progress Notes (Signed)
  Echocardiogram 2D Echocardiogram has been performed.  Diamond Nickel 04/05/2015, 12:59 PM

## 2015-04-05 NOTE — Evaluation (Signed)
Physical Therapy Evaluation Patient Details Name: Kendra Maldonado MRN: [id number] DOB: May 02, 1933 Today's Date: 04/05/2015   History of Present Illness  Pt is a 82 y/o female whom lives alone, has a history of diabetes mellitus, hypertension and previous history of GI bleeding 2015 presents to the ER on 04/04/15 because of persistent dizziness. Per MRI, pt was diagnosed w/ acute cerebellar stroke.  Clinical Impression  Pt is notably unsteady with turns and obstacles but did not lose her balance.  Much better with RW and could benefit from family with her initially to maintain safety for her instability.  Will anticipate PT to try stairs for home next visit if practical, and continue to work toward better balance control with gait and transfers.    Follow Up Recommendations Home health PT;Supervision/Assistance - 24 hour    Equipment Recommendations  Rolling walker with 5" wheels    Recommendations for Other Services       Precautions / Restrictions Precautions Precautions: Fall Restrictions Weight Bearing Restrictions: No      Mobility  Bed Mobility Overal bed mobility: Needs Assistance Bed Mobility: Supine to Sit;Sit to Supine     Supine to sit: Supervision Sit to supine: Supervision      Transfers Overall transfer level: Needs assistance Equipment used: Rolling walker (2 wheeled) Transfers: Sit to/from [facility] Sit to Stand: Supervision;Min guard Stand pivot transfers: Min guard;Supervision       General transfer comment: cued sequence and hand placement  Ambulation/Gait Ambulation/Gait assistance: Min guard Ambulation Distance (Feet): 130 Feet Assistive device: Rolling walker (2 wheeled);1 person hand held assist Gait Pattern/deviations: Step-through pattern;Wide base of support;Drifts right/left;Decreased stride length Gait velocity: reduced Gait velocity interpretation: Below normal speed for age/gender General Gait Details: Pt tends to get  turned around about where to direct her walker, steering directly at [place] objects, had to instruct direction for all turns and negotiations of obstacles  Stairs            Wheelchair Mobility    Modified Rankin (Stroke Patients Only)       Balance Overall balance assessment: Needs assistance Sitting-balance support: Feet supported Sitting balance-Leahy Scale: Fair   Postural control: Posterior lean Standing balance support: Bilateral upper extremity supported Standing balance-Leahy Scale: Fair Standing balance comment: fair- dynamic balance                             Pertinent Vitals/Pain Pain Assessment: No/denies pain    Home Living Family/patient expects to be discharged to:: Private residence Living Arrangements: Alone Available Help at Discharge: Family Type of Home: House Home Access: Stairs to enter Entrance Stairs-Rails: Can reach both Entrance Stairs-Number of Steps: 3  Home Layout: One level Home Equipment: Bedside commode;Walker - 4 wheels;Cane - single point;Shower seat      Prior Function Level of Independence: Independent with assistive device(s)         Comments: Has been using walker at home to help with balance      Hand Dominance   Dominant Hand: Right    Extremity/Trunk Assessment   Upper Extremity Assessment: Overall WFL for tasks assessed           Lower Extremity Assessment: Generalized weakness      Cervical / Trunk Assessment: Normal  Communication   Communication: No difficulties  Cognition Arousal/Alertness: Awake/alert Behavior During Therapy: WFL for tasks assessed/performed Overall Cognitive Status: No family/caregiver present to determine baseline cognitive functioning Area of  Impairment: Attention;Following commands;Safety/judgement;Problem solving   Current Attention Level: Selective Memory: Decreased recall of precautions;Decreased short-term memory Following Commands: Follows one step  commands inconsistently Safety/Judgement: Decreased awareness of safety;Decreased awareness of deficits   Problem Solving: Slow processing;Decreased initiation;Requires verbal cues      General Comments General comments (skin integrity, edema, etc.): Pt is demonstrating some tolerance for activity with strength grossly WFL on LE's but has coordination issues of mobility in legs    Exercises        Assessment/Plan    PT Assessment Patient needs continued PT services  PT Diagnosis Generalized weakness;Difficulty walking   PT Problem List Decreased strength;Decreased range of motion;Decreased activity tolerance;Decreased balance;Decreased mobility;Decreased coordination;Decreased cognition;Decreased knowledge of use of DME;Decreased safety awareness;Decreased knowledge of precautions  PT Treatment Interventions DME instruction;Gait training;Stair training;Functional mobility training;Therapeutic activities;Therapeutic exercise;Balance training;Neuromuscular re-education;Patient/family education   PT Goals (Current goals can be found in the Care Plan section) Acute Rehab PT Goals Patient Stated Goal: Go home PT Goal Formulation: With patient Time For Goal Achievement: 04/19/15 Potential to Achieve Goals: Good    Frequency Min 3X/week   Barriers to discharge Inaccessible home environment;Decreased caregiver support home alone    Co-evaluation               End of Session Equipment Utilized During Treatment: Gait belt Activity Tolerance: Patient tolerated treatment well;Treatment limited secondary to medical complications (Comment) (minor dizziness and fatigue) Patient left: in bed;with call bell/phone within reach;with bed alarm set Nurse Communication: Mobility status         Time: [id number] PT Time Calculation (min) (ACUTE ONLY): 33 min   Charges:   PT Evaluation $PT Eval Moderate Complexity: 1 Procedure PT Treatments $Gait Training: 8-22 mins   PT G CodesRamond Dial 04-25-2015, 7:55 PM  Mee Hives, PT MS Acute Rehab Dept. Number: [facility] [phone] and [place] [phone]

## 2015-04-05 NOTE — Care Management Note (Signed)
Case Management Note  Patient Details  Name: Kendra Maldonado MRN: [id number] Date of Birth: 14-Sep-1933  Subjective/Objective:                  Admitted with CVA.Llives alone. Hx of diabetes mellitus, hypertension and previous history of GI bleeding 2015. Independent with ADL's PTA, NO dme usage. PCP: Lavone Orn.  Action/Plan: Awaiting PT'S evaluation...Marland KitchenMarland KitchenMarland Kitchen CM to f/u with disposition needs.  Expected Discharge Date:                  Expected Discharge Plan:  [place]  In-House Referral:     Discharge planning Services     Post Acute Care Choice:    Choice offered to:  Patient  DME Arranged:    DME Agency:     HH Arranged:    [place] Agency:     Status of Service:  In process, will continue to follow  Medicare Important Message Given:    Date Medicare IM Given:    Medicare IM give by:    Date Additional Medicare IM Given:    Additional Medicare Important Message give by:     If discussed at [place] of Stay Meetings, dates discussed:    Additional Comments: [name]) [phone]  [place], [place] [phone] 04/05/2015, 12:54 PM

## 2015-04-06 LAB — GLUCOSE, CAPILLARY
GLUCOSE-CAPILLARY: 133 mg/dL — AB (ref 65–99)
GLUCOSE-CAPILLARY: 102 mg/dL — AB (ref 65–99)

## 2015-04-06 MED ORDER — CLOPIDOGREL BISULFATE 75 MG PO TABS: 75.0000 mg | ORAL_TABLET | Freq: Every day | ORAL | Status: DC

## 2015-04-06 MED ORDER — ATORVASTATIN CALCIUM 40 MG PO TABS: 40.0000 mg | ORAL_TABLET | Freq: Every day | ORAL | Status: DC

## 2015-04-06 MED ORDER — METOPROLOL TARTRATE 25 MG PO TABS: 12.5000 mg | ORAL_TABLET | Freq: Two times a day (BID) | ORAL | Status: DC

## 2015-04-06 MED ORDER — ASPIRIN 81 MG PO TBEC: 81.0000 mg | DELAYED_RELEASE_TABLET | Freq: Every day | ORAL | Status: DC

## 2015-04-06 NOTE — Care Management Note (Signed)
Case Management Note  Patient Details  Name: Kendra Maldonado MRN: [id number] Date of Birth: 02-Jan-1934  Subjective/Objective:                    Action/Plan: Plan to d/c to home with home health services provided by [facility] (RN,PT,OT).  Expected Discharge Date:        04/06/2015          Expected Discharge Plan:  [place] (Lives alone)  In-House Referral:     Discharge planning Services     Post Acute Care Choice:    Choice offered to:  Patient  DME Arranged:  Shower stool DME Agency:  [place]:  PT, RN, OT [facility] Agency:  [place]  Status of Service:  Completed, signed off  Medicare Important Message Given:    Date Medicare IM Given:    Medicare IM give by:    Date Additional Medicare IM Given:    Additional Medicare Important Message give by:     If discussed at [place] of Stay Meetings, dates discussed:    Additional Comments:  Sharin Mons, RN 04/06/2015, 1:32 PM

## 2015-04-06 NOTE — Progress Notes (Signed)
Nsg Discharge Note  Admit Date:  04/04/2015 Discharge date: 04/06/2015   Kendra Maldonado to be D/C'd Home per MD order.  AVS completed.  Copy for chart, and copy for patient signed, and dated. Patient/caregiver able to verbalize understanding.  Discharge Medication:   Medication List    TAKE these medications        amLODipine 5 MG tablet  Commonly known as:  NORVASC  Take 5 mg by mouth daily.     aspirin 81 MG EC tablet  Take 1 tablet (81 mg total) by mouth daily.     atorvastatin 40 MG tablet  Commonly known as:  LIPITOR  Take 1 tablet (40 mg total) by mouth daily at 6 PM.     clopidogrel 75 MG tablet  Commonly known as:  PLAVIX  Take 1 tablet (75 mg total) by mouth daily.     D 2000 2000 units Tabs  Generic drug:  Cholecalciferol  Take 2,000 Units by mouth daily.     diphenoxylate-atropine 2.5-0.025 MG tablet  Commonly known as:  LOMOTIL  Take 2 tablets by mouth daily as needed for diarrhea or loose stools.     ferrous sulfate 325 (65 FE) MG tablet  Take 1 tablet (325 mg total) by mouth daily with breakfast.     meclizine 25 MG tablet  Commonly known as:  ANTIVERT  Take 25 mg by mouth as needed for dizziness.     metFORMIN 500 MG tablet  Commonly known as:  GLUCOPHAGE  Take 500 mg by mouth 2 (two) times daily.     metoprolol tartrate 25 MG tablet  Commonly known as:  LOPRESSOR  Take 0.5 tablets (12.5 mg total) by mouth 2 (two) times daily.     omeprazole 20 MG capsule  Commonly known as:  PRILOSEC  Take 20 mg by mouth daily.        Discharge Assessment: Filed Vitals:   04/06/15 0417 04/06/15 0824  BP: 156/71 160/76  Pulse: 64 67  Temp: 98.1 F (36.7 C) 97.5 F (36.4 C)  Resp: 17 20   Skin clean, dry and intact without evidence of skin break down, no evidence of skin tears noted. IV catheter discontinued intact. Site without signs and symptoms of complications - no redness or edema noted at insertion site, patient denies c/o pain - only slight  tenderness at site.  Dressing with slight pressure applied.  D/c Instructions-Education: Discharge instructions given to patient/family with verbalized understanding. D/c education completed with patient/family including follow up instructions, medication list, d/c activities limitations if indicated, with other d/c instructions as indicated by MD - patient able to verbalize understanding, all questions fully answered. Patient instructed to return to ED, call 911, or call MD for any changes in condition.  Patient escorted via [place], and D/C home via private auto.  Salley Slaughter, RN 04/06/2015 1:12 PM

## 2015-04-07 NOTE — Discharge Summary (Signed)
Physician Discharge Summary  Kendra Maldonado [id number] DOB: 03-Mar-1933 DOA: 04/04/2015  PCP: Irven Shelling, MD  Admit date: 04/04/2015 Discharge date: 04/06/2015  Time spent: 25minutes  Recommendations for Outpatient Follow-up:  1. Follow up with PCP , neurology and cardiology as recommended   Discharge Diagnoses:  Principal Problem:   Cerebellar stroke, acute ([place]) Active Problems:   Stroke ([place])   Hypertension   Stroke (cerebrum) ([place])   Type 2 diabetes mellitus with vascular disease ([place])   HLD (hyperlipidemia)   Cerebrovascular accident (CVA) due to occlusion of right cerebellar artery [facility])   Discharge Condition: improved  Diet recommendation: carb modified  Filed Weights   04/03/15 2337 04/04/15 1637  Weight: 74.844 kg (165 lb) 77.111 kg (170 lb)    History of present illness:   Kendra Maldonado is an 82 y.o. female who states she has been suffering from vertigo for the past 3 months and has been on meclizine, presents for the evaluation of vertigo. Hospital Course:  Acute cerebellar stroke -  Neurology consulted High grade stenosis on MRA aspirin cautioned that patient has had previous GI bleed in 2015. neurology recommended aspirin and plavix for 3 months followed by plavix alone.  hemoglobin A1c :6.7- needs tighter control lipid panel: LDL > 70-- add statin Physical therapy consult/OT recommended home health PT/OT.   Diabetes mellitus type 2 -  RESUME home metformin on discharge.   Hypertension  -permissive HTN -add BB (low dose) for episode of SVT-- did not appear to be a fib-- - recommend cardiac event monitor for eval of afib.   History of GI bleed - on Protonix. Note that patient is on aspirin now. Monitor hemoglobin periodically.  Hypokalemia -repleted   Procedures:  MRI brain  echocardiogram  Consultations:  neurology  Discharge Exam: Filed Vitals:   04/06/15 0417 04/06/15 0824  BP: 156/71 160/76  Pulse: 64 67  Temp:  98.1 F (36.7 C) 97.5 F (36.4 C)  Resp: 17 20    General: alert afebrile comfortable.  Cardiovascular: s1s2 Respiratory: ctab  Discharge Instructions   Discharge Instructions    Ambulatory referral to Neurology    Complete by:  As directed   Pt will follow up with Dr. Erlinda Hong at [facility] in about 2 months. Thanks.     Diet - low sodium heart healthy    Complete by:  As directed      Discharge instructions    Complete by:  As directed   FOLLOW up with cardiology for event monitor placement 30 day monitor.  Their office will call to for appointment.  Please follow up with neurology as recommended.  Please follow up with primary care doctor in one week.     Increase activity slowly    Complete by:  As directed           Discharge Medication List as of 04/06/2015  1:12 PM    START taking these medications   Details  aspirin EC 81 MG EC tablet Take 1 tablet (81 mg total) by mouth daily., Starting 04/06/2015, Until Discontinued, Print    atorvastatin (LIPITOR) 40 MG tablet Take 1 tablet (40 mg total) by mouth daily at 6 PM., Starting 04/06/2015, Until Discontinued, Print    clopidogrel (PLAVIX) 75 MG tablet Take 1 tablet (75 mg total) by mouth daily., Starting 04/06/2015, Until Discontinued, Print    metoprolol tartrate (LOPRESSOR) 25 MG tablet Take 0.5 tablets (12.5 mg total) by mouth 2 (two) times daily., Starting 04/06/2015, Until Discontinued, Print  CONTINUE these medications which have NOT CHANGED   Details  amLODipine (NORVASC) 5 MG tablet Take 5 mg by mouth daily., Starting 03/25/2015, Until Discontinued, Historical Med    Cholecalciferol (D 2000) 2000 UNITS TABS Take 2,000 Units by mouth daily., Until Discontinued, Historical Med    diphenoxylate-atropine (LOMOTIL) 2.5-0.025 MG per tablet Take 2 tablets by mouth daily as needed for diarrhea or loose stools. , Starting 09/20/2013, Until Discontinued, Historical Med    ferrous sulfate 325 (65 FE) MG tablet Take 1 tablet (325 mg  total) by mouth daily with breakfast., Starting 12/11/2013, Until Discontinued, Normal    meclizine (ANTIVERT) 25 MG tablet Take 25 mg by mouth as needed for dizziness. , Starting 03/16/2015, Until Discontinued, Historical Med    metFORMIN (GLUCOPHAGE) 500 MG tablet Take 500 mg by mouth 2 (two) times daily., Starting 10/07/2013, Until Discontinued, Historical Med    omeprazole (PRILOSEC) 20 MG capsule Take 20 mg by mouth daily., Until Discontinued, Historical Med       Allergies  Allergen Reactions  . Codeine Rash  . Demerol [Meperidine] Rash   Follow-up Information    Follow up with Xu,Jindong, MD. Schedule an appointment as soon as possible for a visit in 2 months.   Specialty:  Neurology   Why:  stroke clinic   Contact information:   [address] [place] [address] [phone]       Follow up with Irven Shelling, MD. Schedule an appointment as soon as possible for a visit in 1 week.   Specialty:  Internal Medicine   Contact information:   [address] [place] [address] [phone]       Call [facility].   Specialty:  Cardiology   Why:  for 30 day cardiac event monitor placement.    Contact information:   [address] [place] [address], [place] [phone]      Follow up with [facility].   Why:  Arranged shower chair to be delivered to bedside   Contact information:   [address] [place] [address] [phone]       Follow up with [facility].   Why:  Home Health arranged for RN,PT,OT   Contact information:   [address] [place] [address] [phone]        The results of significant diagnostics from this hospitalization (including imaging, microbiology, ancillary and laboratory) are listed below for reference.    Significant Diagnostic Studies: Ct Angio Neck W/cm &/or Wo/cm  04/04/2015  CLINICAL  DATA:  82 year old diabetic hypertensive female with dizziness and acute right cerebellar infarct. Subsequent encounter. EXAM: CT ANGIOGRAPHY NECK TECHNIQUE: Multidetector CT imaging of the neck was performed using the standard protocol during bolus administration of intravenous contrast. Multiplanar CT image reconstructions and MIPs were obtained to evaluate the vascular anatomy. Carotid stenosis measurements (when applicable) are obtained utilizing NASCET criteria, using the distal internal carotid diameter as the denominator. CONTRAST:  40mL OMNIPAQUE IOHEXOL 350 MG/ML SOLN COMPARISON:  None. FINDINGS: Aortic arch: 3 vessel arch with plaque and mild narrowing proximal left subclavian artery. Right carotid system: Ectatic right common carotid artery. Minimal plaque carotid bifurcation and proximal right internal carotid artery without measurable stenosis. Left carotid system: Ectatic left common carotid artery. Plaque left carotid bifurcation without significant stenosis of the left internal carotid artery. Vertebral arteries:Right vertebral artery is dominant. Plaque with mild narrowing  proximal right vertebral artery and distal right vertebral artery without high-grade stenosis. High-grade stenosis proximal right posterior inferior cerebellar artery corresponding to patient's acute infarct. Ectatic proximal left vertebral artery. Skeleton: Cervical spondylotic changes most notable C4-5. Other neck: No worrisome primary neck mass or adenopathy. Calcification palatine tonsils consistent with prior inflammation. Impression upon the right posterior wall aspect of the pharynx by ectatic right carotid artery. Upper chest: No worrisome lung apical mass. Chronic lung changes. Post left axillary lymph node dissection. IMPRESSION: Right vertebral artery is dominant. Plaque with mild narrowing proximal right vertebral artery and distal right vertebral artery without high-grade stenosis. High-grade stenosis  proximal right posterior inferior cerebellar artery corresponding to patient's acute infarct. Ectatic proximal left vertebral artery without significant narrowing. Mild plaque carotid bifurcation bilaterally with less than 50% diameter stenosis of the proximal internal carotid arteries. Electronically Signed   By: Genia Del M.D.   On: 04/04/2015 15:38   Mr Brain Wo Contrast  04/04/2015  CLINICAL DATA:  Single episode of dizziness yesterday evening. Mild headache, weakness. History of hypertension, diabetes, breast cancer. EXAM: MRI HEAD WITHOUT CONTRAST TECHNIQUE: Multiplanar, multiecho pulse sequences of the brain and surrounding structures were obtained without intravenous contrast. COMPARISON:  CT head March 02, 2007 FINDINGS: Wedge-like reduced diffusion RIGHT inferior cerebellum. Corresponding low ADC values and T2 bright signal. No susceptibility artifact to suggest hemorrhage. Ventricles and sulci are normal for patient's age. Patchy to confluent supratentorial and pontine white matter FLAIR T2 hyperintensities without midline shift, mass effect or masses. No abnormal extra-axial fluid collections. Major intracranial vascular flow voids present at the skullbase. Status post RIGHT ocular lens implant. Small LEFT maxillary mucosal retention cyst. Mastoid air cells demonstrate trace effusion. No abnormal sellar expansion. No cerebellar tonsillar ectopia. No suspicious calvarial bone marrow signal. Patient is edentulous. IMPRESSION: Acute RIGHT posterior inferior cerebellar artery territory infarct. Moderate chronic small vessel ischemic disease. Involutional changes. Electronically Signed   By: Elon Alas M.D.   On: 04/04/2015 03:44   Mr Mra Head/brain Wo Cm  04/04/2015  CLINICAL DATA:  82 year old female with acute onset dizziness. Acute right PICA infarct on MRI earlier today. Initial encounter. EXAM: MRA HEAD WITHOUT CONTRAST TECHNIQUE: Angiographic images of the Circle of Willis were  obtained using MRA technique without intravenous contrast. COMPARISON:  Brain MRI 0310 hours today. FINDINGS: Antegrade flow in the posterior circulation with mildly dominant distal right vertebral artery. The right PICA origin is patent, however about 8 mm beyond its origin there is near occlusion of the vessel (series 305, image 10) with a 4-5 mm segment of loss flow signal. There is preserved distal flow. No distal vertebral artery irregularity. Normal left PICA origin. Normal vertebrobasilar junction. No basilar stenosis. Normal SCA and PCA origins. Posterior communicating arteries are diminutive or absent. Normal bilateral PCA branches. Antegrade flow in both ICA siphons. 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 Ophthalmic artery origins are within normal limits. Patent carotid termini. Normal MCA and ACA origins. Anterior communicating artery and visualized ACA branches are within normal limits. Bilateral MCA M1 segments, MCA bifurcations, and visualized bilateral MCA branches are within normal limits. Abnormal signal in the posterior inferior right cerebellum corresponding to the infarcts seen on MRI. No posterior fossa mass effect is evident. IMPRESSION: 1. Negative distal vertebral arteries and right PICA origin is patent but there is a High-grade lesion in the proximal right PICA (series 305, image 10) concordant with the MRI findings. 2. Otherwise negative posterior circulation. Negative anterior circulation aside from  ICA siphon dolichoectasia. Electronically Signed   By: Genevie Ann M.D.   On: 04/04/2015 09:40    Microbiology: No results found for this or any previous visit (from the past 240 hour(s)).   Labs: Basic Metabolic Panel:  Recent Labs Lab 04/03/15 2004 04/04/15 0431 04/04/15 0721  NA 145 144 143  K 3.7 3.5 3.4*  CL 107 106 108  CO2 22  --  23  GLUCOSE 141* 125* 133*  BUN 28* 26* 20  CREATININE 1.05* 0.90 0.86  CALCIUM 9.5  --  9.2   Liver Function  Tests:  Recent Labs Lab 04/03/15 2004 04/04/15 0721  AST 31 19  ALT 16 16  ALKPHOS 171* 152*  BILITOT 0.7 0.4  PROT 7.3 7.0  ALBUMIN 3.8 3.5   No results for input(s): LIPASE, AMYLASE in the last 168 hours. No results for input(s): AMMONIA in the last 168 hours. CBC:  Recent Labs Lab 04/03/15 2004 04/04/15 0431 04/04/15 0451 04/04/15 0721  WBC 5.5  --  5.1 5.0  NEUTROABS 3.5  --  2.9  --   HGB 12.0 12.2 11.2* 11.8*  HCT 38.3 36.0 36.3 38.1  MCV 93.6  --  93.1 93.4  PLT 200  --  170 170   Cardiac Enzymes: No results for input(s): CKTOTAL, CKMB, CKMBINDEX, TROPONINI in the last 168 hours. BNP: BNP (last 3 results) No results for input(s): BNP in the last 8760 hours.  ProBNP (last 3 results) No results for input(s): PROBNP in the last 8760 hours.  CBG:  Recent Labs Lab 04/05/15 1202 04/05/15 1701 04/05/15 2024 04/06/15 0755 04/06/15 1210  GLUCAP 121* 133* 251* 133* 102*       Signed:  Lujean Ebright MD.  [facility] 04/07/2015, 8:06 AM

## 2015-05-23 ENCOUNTER — Ambulatory Visit (INDEPENDENT_AMBULATORY_CARE_PROVIDER_SITE_OTHER): Payer: Medicare Other | Admitting: Neurology

## 2015-05-23 ENCOUNTER — Encounter: Payer: Self-pay | Admitting: Neurology

## 2015-05-23 VITALS — BP 122/66 | HR 60 | Ht 67.5 in | Wt 170.5 lb

## 2015-05-23 DIAGNOSIS — I63011 Cerebral infarction due to thrombosis of right vertebral artery: Secondary | ICD-10-CM

## 2015-05-23 NOTE — Patient Instructions (Signed)
Stroke Prevention Some medical conditions and behaviors are associated with an increased chance of having a stroke. You may prevent a stroke by making healthy choices and managing medical conditions. HOW CAN I REDUCE MY RISK OF HAVING A STROKE?   Stay physically active. Get at least 30 minutes of activity on most or all days.  Do not smoke. It may also be helpful to avoid exposure to secondhand smoke.  Limit alcohol use. Moderate alcohol use is considered to be:  No more than 2 drinks per day for men.  No more than 1 drink per day for nonpregnant women.  Eat healthy foods. This involves:  Eating 5 or more servings of fruits and vegetables a day.  Making dietary changes that address high blood pressure (hypertension), high cholesterol, diabetes, or obesity.  Manage your cholesterol levels.  Making food choices that are high in fiber and low in saturated fat, trans fat, and cholesterol may control cholesterol levels.  Take any prescribed medicines to control cholesterol as directed by your health care provider.  Manage your diabetes.  Controlling your carbohydrate and sugar intake is recommended to manage diabetes.  Take any prescribed medicines to control diabetes as directed by your health care provider.  Control your hypertension.  Making food choices that are low in salt (sodium), saturated fat, trans fat, and cholesterol is recommended to manage hypertension.  Ask your health care provider if you need treatment to lower your blood pressure. Take any prescribed medicines to control hypertension as directed by your health care provider.  If you are 18-39 years of age, have your blood pressure checked every 3-5 years. If you are 40 years of age or older, have your blood pressure checked every year.  Maintain a healthy weight.  Reducing calorie intake and making food choices that are low in sodium, saturated fat, trans fat, and cholesterol are recommended to manage  weight.  Stop drug abuse.  Avoid taking birth control pills.  Talk to your health care provider about the risks of taking birth control pills if you are over 35 years old, smoke, get migraines, or have ever had a blood clot.  Get evaluated for sleep disorders (sleep apnea).  Talk to your health care provider about getting a sleep evaluation if you snore a lot or have excessive sleepiness.  Take medicines only as directed by your health care provider.  For some people, aspirin or blood thinners (anticoagulants) are helpful in reducing the risk of forming abnormal blood clots that can lead to stroke. If you have the irregular heart rhythm of atrial fibrillation, you should be on a blood thinner unless there is a good reason you cannot take them.  Understand all your medicine instructions.  Make sure that other conditions (such as anemia or atherosclerosis) are addressed. SEEK IMMEDIATE MEDICAL CARE IF:   You have sudden weakness or numbness of the face, arm, or leg, especially on one side of the body.  Your face or eyelid droops to one side.  You have sudden confusion.  You have trouble speaking (aphasia) or understanding.  You have sudden trouble seeing in one or both eyes.  You have sudden trouble walking.  You have dizziness.  You have a loss of balance or coordination.  You have a sudden, severe headache with no known cause.  You have new chest pain or an irregular heartbeat. Any of these symptoms may represent a serious problem that is an emergency. Do not wait to see if the symptoms will   go away. Get medical help at once. Call your local emergency services (911 in [place] Do not drive yourself to the hospital.   This information is not intended to replace advice given to you by your health care provider. Make sure you discuss any questions you have with your health care provider.   Document Released: 02/15/2004 Document Revised: 01/28/2014 Document Reviewed:  07/10/2012 Elsevier Interactive Patient Education 2016 [employer]  

## 2015-05-23 NOTE — Progress Notes (Signed)
Reason for visit: Stroke  Referring physician: [facility]  Kendra Maldonado is a 82 y.o. female  History of present illness:  Kendra Maldonado is a 82 year old right-handed white female who was admitted to [facility] on 04/04/2015. The patient has had a 6 week history of intermittent episodes of vertigo with a tendency to fall to the right. The patient went to urgent care, she was placed on meclizine. The patient had a more severe event on the day of admission, she called for assistance and she was brought to the hospital after she was found to have a very elevated blood pressure. The patient had been walking with a walker prior to admission. The patient was found to have a right cerebellar stroke with evidence of high-grade stenosis of the right posterior inferior cerebellar artery. She was not on aspirin or Plavix prior to admission, she has been discharged on both of these medications with plans to discontinue the Plavix in 90 days. The patient denies any slurred speech, headache, numbness or weakness of the arms or legs. Following discharge, the patient underwent some physical therapy which has been completed. The patient lives alone, and she has steps going to the basement and to the upper level of her house. She has not sustained any falls, she has converted back to using a cane. She believes that her physical abilities are very close to her usual baseline. The patient did have some gait instability associated with degenerative arthritis of the knees prior to the stroke. She was placed on a cholesterol lowering agent in the hospital, she has a history of diabetes with a hemoglobin A1c of 6.7. She comes back for follow-up at this time.  Past Medical History  Diagnosis Date  . Diabetes mellitus type 2, uncontrolled ([place])   . Essential hypertension   . Chronic diarrhea   . Back pain   . Hiatal hernia     Stenosis of the spine  . Breast cancer, left breast ([place]) 2010    lumpectomy     Past Surgical History  Procedure Laterality Date  . Esophagogastroduodenoscopy (egd) with propofol N/A 12/10/2013    Procedure: ESOPHAGOGASTRODUODENOSCOPY (EGD) WITH PROPOFOL;  Surgeon: Jeryl Columbia, MD;  Location: [facility] ENDOSCOPY;  Service: Endoscopy;  Laterality: N/A;  . Cholecystectomy      Family History  Problem Relation Age of Onset  . Hypertension Mother   . Dementia Father   . Cancer Brother     pancreas    Social history:  reports that she has never smoked. She has never used smokeless tobacco. She reports that she does not drink alcohol or use illicit drugs.  Medications:  Prior to Admission medications   Medication Sig Start Date End Date Taking? Authorizing Provider  amLODipine (NORVASC) 5 MG tablet Take 5 mg by mouth daily. 03/25/15   Historical Provider, MD  aspirin EC 81 MG EC tablet Take 1 tablet (81 mg total) by mouth daily. 04/06/15   Hosie Poisson, MD  atorvastatin (LIPITOR) 40 MG tablet Take 1 tablet (40 mg total) by mouth daily at 6 PM. 04/06/15   Hosie Poisson, MD  Cholecalciferol (D 2000) 2000 UNITS TABS Take 2,000 Units by mouth daily.    Historical Provider, MD  clopidogrel (PLAVIX) 75 MG tablet Take 1 tablet (75 mg total) by mouth daily. 04/06/15   Hosie Poisson, MD  diphenoxylate-atropine (LOMOTIL) 2.5-0.025 MG per tablet Take 2 tablets by mouth daily as needed for diarrhea or loose stools.  09/20/13  Historical Provider, MD  ferrous sulfate 325 (65 FE) MG tablet Take 1 tablet (325 mg total) by mouth daily with breakfast. Patient taking differently: Take 325 mg by mouth 2 (two) times daily with a meal.  12/11/13   Sid Falcon, MD  gabapentin (NEURONTIN) 300 MG capsule Take 300 mg by mouth.    Historical Provider, MD  losartan (COZAAR) 100 MG tablet Take 100 mg by mouth.    Historical Provider, MD  metFORMIN (GLUCOPHAGE) 500 MG tablet Take 500 mg by mouth 2 (two) times daily. 10/07/13   Historical Provider, MD  metoprolol tartrate (LOPRESSOR) 25 MG tablet Take  0.5 tablets (12.5 mg total) by mouth 2 (two) times daily. 04/06/15   Hosie Poisson, MD  omeprazole (PRILOSEC) 20 MG capsule Take 20 mg by mouth daily.    Historical Provider, MD      Allergies  Allergen Reactions  . Codeine Rash  . Demerol [Meperidine] Rash    ROS:  Out of a complete 14 system review of symptoms, the patient complains only of the following symptoms, and all other reviewed systems are negative.  Hearing loss Diarrhea Urination problems  Blood pressure 122/66, pulse 60, height 5' 7.5" (1.715 m), weight 170 lb 8 oz (77.338 kg).  Physical Exam  General: The patient is alert and cooperative at the time of the examination.  Eyes: Pupils are equal, round, and reactive to light. Discs are flat bilaterally.  Neck: The neck is supple, no carotid bruits are noted.  Respiratory: The respiratory examination is clear.  Cardiovascular: The cardiovascular examination reveals a regular rate and rhythm, no obvious murmurs or rubs are noted.  Skin: Extremities are with 1+ edema at the ankles bilaterally.  Neurologic Exam  Mental status: The patient is alert and oriented x 3 at the time of the examination. The patient has apparent normal recent and remote memory, with an apparently normal attention span and concentration ability.  Cranial nerves: Facial symmetry is present. There is good sensation of the face to pinprick and soft touch bilaterally. The strength of the facial muscles and the muscles to head turning and shoulder shrug are normal bilaterally. Speech is well enunciated, no aphasia or dysarthria is noted. Extraocular movements are full. Visual fields are full. The tongue is midline, and the patient has symmetric elevation of the soft palate. No obvious hearing deficits are noted.  Motor: The motor testing reveals 5 over 5 strength of all 4 extremities. Good symmetric motor tone is noted throughout.  Sensory: Sensory testing is intact to pinprick, soft touch,  vibration sensation, and position sense on all 4 extremities, with exception of decreased position sense in both feet. No evidence of extinction is noted.  Coordination: Cerebellar testing reveals good finger-nose-finger and heel-to-shin bilaterally. Some apraxia with the use of the extremities is noted.  Gait and station: Gait is wide-based, unsteady. Tandem gait could not be attempted. The patient uses a cane for an ablation. Romberg is negative. No drift is seen.  Reflexes: Deep tendon reflexes are symmetric, but are depressed bilaterally. Toes are downgoing bilaterally.   Ct Angio Neck W/cm &/or Wo/cm  04/04/2015 Right vertebral artery is dominant. Plaque with mild narrowing proximal right vertebral artery and distal right vertebral artery without high-grade stenosis. High-grade stenosis proximal right posterior inferior cerebellar artery corresponding to patient's acute infarct. Ectatic proximal left vertebral artery without significant narrowing. Mild plaque carotid bifurcation bilaterally with less than 50% diameter stenosis of the proximal internal carotid arteries.   Mr Brain  Wo Contrast  04/04/2015 Acute RIGHT posterior inferior cerebellar artery territory infarct. Moderate chronic small vessel ischemic disease. Involutional changes.   * MRI scan images were reviewed online. I agree with the written report.   Mr Jodene Nam Head/brain Wo Cm  04/04/2015 1. Negative distal vertebral arteries and right PICA origin is patent but there is a High-grade lesion in the proximal right PICA (series 305, image 10) concordant with the MRI findings. 2. Otherwise negative posterior circulation. Negative anterior circulation aside from ICA siphon dolichoectasia.   2D echo - - Left ventricle: The cavity size was normal. There was mild  concentric hypertrophy. Systolic function was normal. The  estimated ejection fraction was in the range of 60% to 65%. Wall  motion was normal; there were no  regional wall motion  abnormalities. Features are consistent with a pseudonormal left  ventricular filling pattern, with concomitant abnormal relaxation  and increased filling pressure (grade 2 diastolic dysfunction).  Doppler parameters are consistent with high ventricular filling  pressure. - Aortic valve: Transvalvular velocity was within the normal range.  There was no stenosis. There was no regurgitation. - Mitral valve: Calcified annulus. Transvalvular velocity was  within the normal range. There was no evidence for stenosis.  There was mild regurgitation. - Left atrium: The atrium was severely dilated. - Right ventricle: The cavity size was normal. Wall thickness was  normal. Systolic function was normal. - Tricuspid valve: There was trivial regurgitation. - Pulmonic valve: There was mild regurgitation. - Pulmonary arteries: Systolic pressure was mildly increased. PA  peak pressure: 34 mm Hg (S). - Inferior vena cava: The vessel was normal in size. The  respirophasic diameter changes were in the normal range (>= 50%),  consistent with normal central venous pressure. - Pericardium, extracardiac: A trivial pericardial effusion was  identified posterior to the heart. Features were not consistent  with tamponade physiology.    Assessment/Plan:  1. Recent right cerebellar stroke  2. Gait disturbance  3. Diabetes  4. Hypertension  5. Dyslipidemia  The patient will remain on the aspirin and Plavix combination for 90 days following the stroke event, she will discontinue the Plavix in mid June 2017. She indicates that she is at her usual baseline in terms of her physical capabilities. She will follow-up through this office on an as-needed basis. She will follow-up with her primary care physician to regulate her blood pressure and cholesterol. The patient does not smoke cigarettes. The stroke was likely related to intrinsic atherosclerotic changes involving the  posterior inferior cerebellar artery on the right. The patient has a mild to moderate level of generalized small vessel ischemic changes as well by MRI brain.  Jill Alexanders MD 05/23/2015 8:38 PM  [facility] [address] [place],  [address]  Phone [phone] Fax [phone]

## 2015-06-20 ENCOUNTER — Telehealth: Payer: Self-pay | Admitting: Neurology

## 2015-06-20 NOTE — Telephone Encounter (Signed)
Spoke to pt. Last OV note mentions that pt will come off of Plavix and aspirin around the middle of June. The pt says that she did receive a TPI that has not helped and feels that she will need something else done for the pain prior to completing Plavix.

## 2015-06-20 NOTE — Telephone Encounter (Signed)
Pt called sts Kendra Maldonado is in severe back pain. Kendra Maldonado said Dr Ernestina Patches gave her a "target" injection and it did not help. Kendra Maldonado is wanting to get a steroid injection by Dr Ernestina Patches but sts Kendra Maldonado will need to discontinue plavix. Please call.

## 2015-06-20 NOTE — Telephone Encounter (Addendum)
I called the patient, there is no answer, I'll call back later.  I called the patient back. The patient is working with Dr. Ernestina Patches for her back pain. In mid-June she can come off of her Plavix, the made be up to do more invasive procedures at that point for her back pain.

## 2016-02-08 ENCOUNTER — Ambulatory Visit (INDEPENDENT_AMBULATORY_CARE_PROVIDER_SITE_OTHER): Payer: Medicare Other | Admitting: Orthopaedic Surgery

## 2016-02-14 ENCOUNTER — Encounter (INDEPENDENT_AMBULATORY_CARE_PROVIDER_SITE_OTHER): Payer: Self-pay | Admitting: Orthopedic Surgery

## 2016-02-14 ENCOUNTER — Telehealth (INDEPENDENT_AMBULATORY_CARE_PROVIDER_SITE_OTHER): Payer: Self-pay | Admitting: Orthopaedic Surgery

## 2016-02-14 ENCOUNTER — Ambulatory Visit (INDEPENDENT_AMBULATORY_CARE_PROVIDER_SITE_OTHER): Payer: Medicare Other

## 2016-02-14 ENCOUNTER — Ambulatory Visit (INDEPENDENT_AMBULATORY_CARE_PROVIDER_SITE_OTHER): Payer: Medicare Other | Admitting: Orthopedic Surgery

## 2016-02-14 ENCOUNTER — Ambulatory Visit (INDEPENDENT_AMBULATORY_CARE_PROVIDER_SITE_OTHER): Payer: Self-pay

## 2016-02-14 VITALS — BP 137/68 | HR 68 | Resp 14 | Ht 67.0 in | Wt 174.0 lb

## 2016-02-14 DIAGNOSIS — G8929 Other chronic pain: Secondary | ICD-10-CM

## 2016-02-14 DIAGNOSIS — M25669 Stiffness of unspecified knee, not elsewhere classified: Secondary | ICD-10-CM

## 2016-02-14 DIAGNOSIS — M1612 Unilateral primary osteoarthritis, left hip: Secondary | ICD-10-CM

## 2016-02-14 DIAGNOSIS — M25562 Pain in left knee: Secondary | ICD-10-CM

## 2016-02-14 LAB — COMPLETE METABOLIC PANEL WITH GFR
ALT: 11 U/L (ref 6–29)
AST: 13 U/L (ref 10–35)
Albumin: 3.7 g/dL (ref 3.6–5.1)
Alkaline Phosphatase: 149 U/L — ABNORMAL HIGH (ref 33–130)
BILIRUBIN TOTAL: 0.3 mg/dL (ref 0.2–1.2)
BUN: 27 mg/dL — ABNORMAL HIGH (ref 7–25)
CO2: 22 mmol/L (ref 20–31)
Calcium: 9.3 mg/dL (ref 8.6–10.4)
Chloride: 111 mmol/L — ABNORMAL HIGH (ref 98–110)
Creat: 1.18 mg/dL — ABNORMAL HIGH (ref 0.60–0.88)
GFR, EST AFRICAN AMERICAN: 50 mL/min — AB (ref >=60)
GFR, EST NON AFRICAN AMERICAN: 44 mL/min — AB (ref >=60)
Glucose, Bld: 215 mg/dL — ABNORMAL HIGH (ref 65–99)
Potassium: 4.8 mmol/L (ref 3.5–5.3)
Sodium: 143 mmol/L (ref 135–146)
TOTAL PROTEIN: 6.6 g/dL (ref 6.1–8.1)

## 2016-02-14 LAB — CBC WITH DIFFERENTIAL/PLATELET
Basophils Absolute: 52 cells/uL (ref 0–200)
Basophils Relative: 1 %
EOS ABS: 208 {cells}/uL (ref 15–500)
EOS PCT: 4 %
HCT: 24.7 % — ABNORMAL LOW (ref 35.0–45.0)
Hemoglobin: 7.4 g/dL — ABNORMAL LOW (ref 11.7–15.5)
Lymphocytes Relative: 25 %
Lymphs Abs: 1300 cells/uL (ref 850–3900)
MCH: 29.4 pg (ref 27.0–33.0)
MCHC: 30 g/dL — ABNORMAL LOW (ref 32.0–36.0)
MCV: 98 fL (ref 80.0–100.0)
MONOS PCT: 8 %
MPV: 11.3 fL (ref 7.5–12.5)
Monocytes Absolute: 416 cells/uL (ref 200–950)
NEUTROS ABS: 3224 {cells}/uL (ref 1500–7800)
Neutrophils Relative %: 62 %
PLATELETS: 258 10*3/uL (ref 140–400)
RBC: 2.52 MIL/uL — ABNORMAL LOW (ref 3.80–5.10)
RDW: 14.5 % (ref 11.0–15.0)
WBC: 5.2 10*3/uL (ref 3.8–10.8)

## 2016-02-14 NOTE — Telephone Encounter (Signed)
Please call.

## 2016-02-14 NOTE — Progress Notes (Signed)
Office Visit Note   Patient: Kendra Maldonado           Date of Birth: 04/27/1933           MRN: [id number] Visit Date: 02/14/2016              Requested by: Lavone Orn, MD [address] [place], [place] [address] PCP: Irven Shelling, MD   Assessment & Plan: Visit Diagnoses:  1. Chronic pain of left knee   2. Decreased ROM of lower extremity   3. Primary osteoarthritis of left hip    At this time radiographically I'm concerned that she may have either that of Paget's disease or possibly myeloma. This was evaluated also with Dr. Estanislado Pandy who was concerned also.  Plan:  #1: injection to the left knee was performed and she states she feels better. #2: Serum and urine protein electrophoresis is been ordered. Sedimentation rate. Complete metabolic panel. CBC with differential. And immunoglobulins have been ordered also. #3: Follow back up in 1 week at that time will review her labs to see if whether an MRI scan is indicated for the hemipelvis.   Follow-Up Instructions: Return in about 1 week (around 02/21/2016).   Orders:  Orders Placed This Encounter  Procedures  . XR KNEE 3 VIEW LEFT  . XR HIP UNILAT W OR W/O PELVIS 2-3 VIEWS LEFT  . COMPLETE METABOLIC PANEL WITH GFR  . CBC with Differential  . Protein electrophoresis, urine  . Protein Electrophoresis, (serum)  . Sed Rate (ESR)  . IgG, IgA, IgM   No orders of the defined types were placed in this encounter.     Procedures: No procedures performed   Clinical Data: No additional findings.   Subjective: Chief Complaint  Patient presents with  . Left Knee - Pain, Edema      Pt presents with Left knee pain with edema. Denies injury, no  cortisone injection in Left knee.  Pt diabetic Stroke 03/2015    Review of Systems  Constitutional: Negative.   HENT: Negative.   Eyes: Negative.   Respiratory: Negative.   Cardiovascular: Negative.   Gastrointestinal: Negative.   Endocrine: Negative.    Genitourinary: Negative.   Musculoskeletal: Negative.   Skin: Negative.   Allergic/Immunologic: Negative.   Neurological: Negative.   Hematological: Negative.      Objective: Vital Signs: BP 137/68   Pulse 68   Resp 14   Ht _0  (1.702 m)   Wt 174 lb (78.9 kg)   BMI 27.25 kg/m   Physical Exam  Constitutional: She is oriented to person, place, and time. She appears well-developed and well-nourished.  HENT:  Head: Normocephalic and atraumatic.  Eyes: EOM are normal. Pupils are equal, round, and reactive to light.  Pulmonary/Chest: Effort normal.  Musculoskeletal:       Left knee: She exhibits effusion.  Neurological: She is alert and oriented to person, place, and time.  Skin: Skin is warm and dry.  Psychiatric: She has a normal mood and affect. Her behavior is normal. Judgment normal.    Left Knee Exam   Tenderness  The patient is experiencing tenderness in the lateral joint line and medial joint line.  Range of Motion  Extension: 5  Flexion: 110   Other  Pulse: present Swelling: mild Effusion: effusion present  Comments:  Crepitance with range of motion is noted..   Left Hip Exam   Range of Motion  Flexion: 100  Left hip internal rotation: 5.  Left hip external rotation: 5.   Other  Sensation: normal  Comments:  She does have a little bit of pain with range of motion of the hip but they're certainly she is limited with her range of motion      Specialty Comments:  No specialty comments available.  Imaging: Xr Hip Unilat W Or W/o Pelvis 2-3 Views Left  Result Date: 02/14/2016 AP view pelvis and lateral hip reveals arthritis of the left hip with mild protrusio. There is periarticular spurring about the subcapital area as well as the superior and inferior acetabulum. Cystic changes in the ME pelvis especially around the acetabulum.  Xr Knee 3 View Left  Result Date: 02/14/2016 Three-view x-ray of the left knee reveals the degenerative changes  in the lateral compartment of the left knee. There is periarticular spurring noted. Cystic changes in both the lateral femoral condyle and tibial plateau. There is some sclerosing in the medial compartment the on the AP. Patellofemoral lateral view does reveal lateral para-articular spurring and degenerative changes noted. The lateral knee does reveal a femoral changes. Some osteopenia. She also has little calcification in the artery posteriorly.    PMFS History: Patient Active Problem List   Diagnosis Date Noted  . HLD (hyperlipidemia) 04/05/2015  . Cerebrovascular accident (CVA) due to occlusion of right cerebellar artery ([place])   . Stroke ([place]) 04/04/2015  . Cerebellar stroke, acute ([place]) 04/04/2015  . Hypertension 04/04/2015  . Stroke (cerebrum) ([place]) 04/04/2015  . Type 2 diabetes mellitus with vascular disease ([place]) 04/04/2015  . Anemia due to GI blood loss   . Symptomatic anemia   . Acute posthemorrhagic anemia 12/09/2013  . Upper GI bleed 12/09/2013  . Back pain 12/09/2013  . Chronic diarrhea 12/09/2013   Past Medical History:  Diagnosis Date  . Back pain   . Breast cancer, left breast ([place]) 2010   lumpectomy  . Chronic diarrhea   . Diabetes mellitus type 2, uncontrolled ([place])   . Essential hypertension   . Hiatal hernia    Stenosis of the spine    Family History  Problem Relation Age of Onset  . Hypertension Mother   . Dementia Father   . Cancer Brother     pancreas    Past Surgical History:  Procedure Laterality Date  . CHOLECYSTECTOMY    . ESOPHAGOGASTRODUODENOSCOPY (EGD) WITH PROPOFOL N/A 12/10/2013   Procedure: ESOPHAGOGASTRODUODENOSCOPY (EGD) WITH PROPOFOL;  Surgeon: Jeryl Columbia, MD;  Location: [facility] ENDOSCOPY;  Service: Endoscopy;  Laterality: N/A;   Social History   Occupational History  . Retired     Financial risk analyst, Orthoptist   Social History Main Topics  . Smoking status: Never Smoker  . Smokeless tobacco: Never Used  . Alcohol use No  . Drug use: No  . Sexual  activity: Not on file

## 2016-02-14 NOTE — Telephone Encounter (Signed)
Patient's son called about patient's appointment today and wanted to know about the lab work that was done? Please call patient's son.

## 2016-02-14 NOTE — Telephone Encounter (Signed)
Called son.

## 2016-02-15 LAB — IGG, IGA, IGM
IgA: 297 mg/dL (ref 81–463)
IgG (Immunoglobin G), Serum: 894 mg/dL (ref 694–1618)
IgM, Serum: 180 mg/dL (ref 48–271)

## 2016-02-16 LAB — PROTEIN ELECTROPHORESIS, SERUM
ABNORMAL PROTEIN BAND1: 0.3 g/dL
ALPHA-1-GLOBULIN: 0.4 g/dL — AB (ref 0.2–0.3)
Albumin ELP: 3.7 g/dL — ABNORMAL LOW (ref 3.8–4.8)
Alpha-2-Globulin: 0.7 g/dL (ref 0.5–0.9)
BETA 2: 0.6 g/dL — AB (ref 0.2–0.5)
Beta Globulin: 0.5 g/dL (ref 0.4–0.6)
GAMMA GLOBULIN: 0.8 g/dL (ref 0.8–1.7)
TOTAL PROTEIN, SERUM ELECTROPHOR: 6.6 g/dL (ref 6.1–8.1)

## 2016-02-16 LAB — SEDIMENTATION RATE: SED RATE: 26 mm/h (ref 0–30)

## 2016-02-21 ENCOUNTER — Ambulatory Visit (INDEPENDENT_AMBULATORY_CARE_PROVIDER_SITE_OTHER): Payer: Medicare Other | Admitting: Orthopedic Surgery

## 2016-02-21 ENCOUNTER — Encounter (INDEPENDENT_AMBULATORY_CARE_PROVIDER_SITE_OTHER): Payer: Self-pay | Admitting: Orthopedic Surgery

## 2016-02-21 VITALS — BP 151/64 | HR 79 | Resp 15 | Ht 67.5 in | Wt 165.0 lb

## 2016-02-21 DIAGNOSIS — R778 Other specified abnormalities of plasma proteins: Secondary | ICD-10-CM | POA: Diagnosis not present

## 2016-02-21 DIAGNOSIS — R937 Abnormal findings on diagnostic imaging of other parts of musculoskeletal system: Secondary | ICD-10-CM

## 2016-02-21 NOTE — Progress Notes (Signed)
Office Visit Note   Patient: Kendra Maldonado           Date of Birth: [birth year]           MRN: [id number] Visit Date: 02/21/2016              Requested by: Lavone Orn, MD [address] [place], [place] [address] PCP: Irven Shelling, MD   Assessment & Plan: Visit Diagnoses:  1. Abnormal serum protein electrophoresis   2. Abnormal x-ray of pelvis     Plan:  #1: I have spoken to Dr. Laurann [place] about her head or laboratory studies. He will follow up on those. #2: The laboratory had not done a reflex to  IMMUNOFIXATION study and that blood was redrawn today because a head disposed with this morning. #3: Plan is to obtain an MRI scan of the pelvis with and without contrast for further evaluation. Just about the use of contrast with her GFR of 44 and he stated that they would use half dose and complete the study. Hopefully this will rule out carcinoma. #4: She'll follow back up after her MRI scan.  Follow-Up Instructions: Return in about 2 weeks (around 03/06/2016).   Orders:  Orders Placed This Encounter  Procedures  . MR Pelvis w/ contrast  . Serum protein electrophoresis with reflex   No orders of the defined types were placed in this encounter.     Procedures: No procedures performed   Clinical Data: No additional findings.   Subjective: Chief Complaint  Patient presents with  . Left Hip - Follow-up    Kendra Maldonado is a very pleasant 83 year old white female who is seen today for evaluation of her left knee symptoms as well as abnormalities of the left hemipelvis. I saw her last week and at that time she was seen for a left knee symptoms. At that time it was felt that right is in the left knee and a corticosteroid injection was given. However test her left hip as a possible was of some of her left knee pain. At that time she had decreased range of motion with internal and external rotation and then x-ray was obtained. This showed on the left hemipelvis to have  a pattern of possible disc disease. This x-ray was also reviewed by Dr. Vicente Males who is also conservative height that might be multiple myeloma. Therefore appropriate laboratory studies were obtained. She returns today for review of those studies.    Review of Systems  Constitutional: Negative.   HENT: Negative.   Eyes: Negative.   Respiratory: Negative.   Cardiovascular: Negative.   Gastrointestinal: Negative.   Endocrine: Negative.   Genitourinary: Negative.   Musculoskeletal: Negative.   Skin: Negative.   Allergic/Immunologic: Negative.   Neurological: Negative.   Hematological: Negative.      Objective: Vital Signs: BP (!) 151/64 (BP Location: Right Arm, Patient Position: Sitting, Cuff Size: Normal)   Pulse 79   Resp 15   Ht 5' 7.5" (1.715 m)   Wt 165 lb (74.8 kg)   BMI 25.46 kg/m   Physical Exam  Constitutional: She is oriented to person, place, and time. She appears well-developed and well-nourished.  HENT:  Head: Normocephalic and atraumatic.  Eyes: EOM are normal. Pupils are equal, round, and reactive to light.  Pulmonary/Chest: Effort normal.  Musculoskeletal:       Left knee: She exhibits effusion.  Neurological: She is alert and oriented to person, place, and time.  Skin: Skin is  warm and dry.  Psychiatric: She has a normal mood and affect. Her behavior is normal. Judgment normal.    Left Knee Exam   Other  Effusion: effusion present   Left Hip Exam   Comments:  She still continues to have decreased motion in the left hip and left knee appears to have been improved.    Her laboratory studies were reviewed with her in the driver who was given permission her son to speak to her about her condition. She was noted to have some elevation in her BUN/creatinine. GFR 10 months ago was greater than 60 and now is 44. SPEP was abnormal with an M spike noted. Her IgA, IgM are normal as well as IgG. Her hemoglobin was 7.4 with hematocrit of 24.7.  On 04/04/2015 her hemoglobin was 11.8 with hematocrit 38.1. Her sedimentation rate was 26 Alpha I globulins 0.4 and this was elevated. Her glucose was noted to be 215 and this was drawn at 1020 7 in the morning.   Specialty Comments:  No specialty comments available.  Imaging: No results found.   PMFS History: Patient Active Problem List   Diagnosis Date Noted  . HLD (hyperlipidemia) 04/05/2015  . Cerebrovascular accident (CVA) due to occlusion of right cerebellar artery ([place])   . Stroke ([place]) 04/04/2015  . Cerebellar stroke, acute ([place]) 04/04/2015  . Hypertension 04/04/2015  . Stroke (cerebrum) ([place]) 04/04/2015  . Type 2 diabetes mellitus with vascular disease ([place]) 04/04/2015  . Anemia due to GI blood loss   . Symptomatic anemia   . Acute posthemorrhagic anemia 12/09/2013  . Upper GI bleed 12/09/2013  . Back pain 12/09/2013  . Chronic diarrhea 12/09/2013   Past Medical History:  Diagnosis Date  . Back pain   . Breast cancer, left breast ([place]) 2010   lumpectomy  . Chronic diarrhea   . Diabetes mellitus type 2, uncontrolled ([place])   . Essential hypertension   . Hiatal hernia    Stenosis of the spine  . Spinal stenosis   . Stroke [facility]) 03/2015    Family History  Problem Relation Age of Onset  . Hypertension Mother   . Dementia Father   . Cancer Brother     pancreas    Past Surgical History:  Procedure Laterality Date  . BREAST SURGERY    . CHOLECYSTECTOMY    . ESOPHAGOGASTRODUODENOSCOPY (EGD) WITH PROPOFOL N/A 12/10/2013   Procedure: ESOPHAGOGASTRODUODENOSCOPY (EGD) WITH PROPOFOL;  Surgeon: Jeryl Columbia, MD;  Location: [facility] ENDOSCOPY;  Service: Endoscopy;  Laterality: N/A;  . HEMORRHOID SURGERY    . PARTIAL HYSTERECTOMY     Social History   Occupational History  . Retired     Financial risk analyst, Orthoptist   Social History Main Topics  . Smoking status: Never Smoker  . Smokeless tobacco: Never Used  . Alcohol use No  . Drug use: No  . Sexual activity: Not on file

## 2016-02-21 NOTE — Progress Notes (Deleted)
   Office Visit Note   Patient: Kendra Maldonado           Date of Birth: 21-Jul-1933           MRN: [id number] Visit Date: 02/21/2016              Requested by: Lavone Orn, MD [address] [place], [place] [address] PCP: Irven Shelling, MD   Assessment & Plan: Visit Diagnoses: No diagnosis found.  Plan: ***  Follow-Up Instructions: No Follow-up on file.   Orders:  No orders of the defined types were placed in this encounter.  No orders of the defined types were placed in this encounter.     Procedures: No procedures performed   Clinical Data: No additional findings.   Subjective: Chief Complaint  Patient presents with  . Left Hip - Follow-up    Lab results, left knee is improving, no pain, no surgery to knee, diabetic type 2    Review of Systems   Objective: Vital Signs: BP (!) 151/64 (BP Location: Right Arm, Patient Position: Sitting, Cuff Size: Normal)   Pulse 79   Resp 15   Ht 5' 7.5" (1.715 m)   Wt 165 lb (74.8 kg)   BMI 25.46 kg/m   Physical Exam  Ortho Exam  Specialty Comments:  No specialty comments available.  Imaging: No results found.   PMFS History: Patient Active Problem List   Diagnosis Date Noted  . HLD (hyperlipidemia) 04/05/2015  . Cerebrovascular accident (CVA) due to occlusion of right cerebellar artery ([place])   . Stroke ([place]) 04/04/2015  . Cerebellar stroke, acute ([place]) 04/04/2015  . Hypertension 04/04/2015  . Stroke (cerebrum) ([place]) 04/04/2015  . Type 2 diabetes mellitus with vascular disease ([place]) 04/04/2015  . Anemia due to GI blood loss   . Symptomatic anemia   . Acute posthemorrhagic anemia 12/09/2013  . Upper GI bleed 12/09/2013  . Back pain 12/09/2013  . Chronic diarrhea 12/09/2013   Past Medical History:  Diagnosis Date  . Back pain   . Breast cancer, left breast ([place]) 2010   lumpectomy  . Chronic diarrhea   . Diabetes mellitus type 2, uncontrolled ([place])   . Essential hypertension     . Hiatal hernia    Stenosis of the spine    Family History  Problem Relation Age of Onset  . Hypertension Mother   . Dementia Father   . Cancer Brother     pancreas    Past Surgical History:  Procedure Laterality Date  . CHOLECYSTECTOMY    . ESOPHAGOGASTRODUODENOSCOPY (EGD) WITH PROPOFOL N/A 12/10/2013   Procedure: ESOPHAGOGASTRODUODENOSCOPY (EGD) WITH PROPOFOL;  Surgeon: Jeryl Columbia, MD;  Location: [facility] ENDOSCOPY;  Service: Endoscopy;  Laterality: N/A;   Social History   Occupational History  . Retired     Financial risk analyst, Orthoptist   Social History Main Topics  . Smoking status: Never Smoker  . Smokeless tobacco: Never Used  . Alcohol use No  . Drug use: No  . Sexual activity: Not on file

## 2016-02-22 ENCOUNTER — Other Ambulatory Visit (INDEPENDENT_AMBULATORY_CARE_PROVIDER_SITE_OTHER): Payer: Self-pay | Admitting: *Deleted

## 2016-02-22 DIAGNOSIS — R937 Abnormal findings on diagnostic imaging of other parts of musculoskeletal system: Secondary | ICD-10-CM

## 2016-02-23 ENCOUNTER — Ambulatory Visit
Admission: RE | Admit: 2016-02-23 | Discharge: 2016-02-23 | Disposition: A | Discharge status: Home / Self Care | Payer: Medicare Other | Source: Ambulatory Visit | Attending: Orthopedic Surgery | Admitting: Orthopedic Surgery

## 2016-02-23 DIAGNOSIS — R937 Abnormal findings on diagnostic imaging of other parts of musculoskeletal system: Secondary | ICD-10-CM

## 2016-02-23 LAB — IFE INTERPRETATION

## 2016-02-23 LAB — PROTEIN ELECTROPHORESIS, SERUM, WITH REFLEX
ABNORMAL PROTEIN BAND1: 0.3 g/dL
ALBUMIN ELP: 3.6 g/dL — AB (ref 3.8–4.8)
Alpha-1-Globulin: 0.4 g/dL — ABNORMAL HIGH (ref 0.2–0.3)
Alpha-2-Globulin: 0.7 g/dL (ref 0.5–0.9)
BETA 2: 0.5 g/dL (ref 0.2–0.5)
BETA GLOBULIN: 0.4 g/dL (ref 0.4–0.6)
GAMMA GLOBULIN: 0.7 g/dL — AB (ref 0.8–1.7)
Total Protein, Serum Electrophoresis: 6.3 g/dL (ref 6.1–8.1)

## 2016-02-23 MED ORDER — GADOBENATE DIMEGLUMINE 529 MG/ML IV SOLN
7.0000 mL | Freq: Once | INTRAVENOUS | Status: AC | PRN
  Administered 2016-02-23: 7 mL via INTRAVENOUS

## 2016-02-25 ENCOUNTER — Other Ambulatory Visit: Payer: Medicare Other

## 2016-02-27 ENCOUNTER — Ambulatory Visit (INDEPENDENT_AMBULATORY_CARE_PROVIDER_SITE_OTHER): Payer: Medicare Other | Admitting: Orthopaedic Surgery

## 2016-02-27 ENCOUNTER — Encounter (INDEPENDENT_AMBULATORY_CARE_PROVIDER_SITE_OTHER): Payer: Self-pay | Admitting: Orthopaedic Surgery

## 2016-02-27 VITALS — BP 152/69 | HR 79 | Resp 14 | Ht 67.5 in | Wt 165.0 lb

## 2016-02-27 DIAGNOSIS — G8929 Other chronic pain: Secondary | ICD-10-CM | POA: Diagnosis not present

## 2016-02-27 DIAGNOSIS — M25562 Pain in left knee: Secondary | ICD-10-CM | POA: Diagnosis not present

## 2016-02-27 NOTE — Progress Notes (Signed)
Office Visit Note   Patient: Kendra Maldonado           Date of Birth: 07-28-1933           MRN: [id number] Visit Date: 02/27/2016              Requested by: Lavone Orn, MD [address] [place], [place] [address] PCP: Irven Shelling, MD   Assessment & Plan: Visit Diagnoses: Osteoarthritis left knee. 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 Presently Kendra Maldonado is not symptomatic in her pelvis or either hip. She had an excellent response to cortisone injection of her left knee. We'll see back as needed for consideration of further cortisone injections in either the left knee or either hip.  Follow-Up Instructions: No Follow-up on file.   Orders:  No orders of the defined types were placed in this encounter.  No orders of the defined types were placed in this encounter.     Procedures: No procedures performed   Clinical Data: No additional findings. . No acute changes. to advanced degenerative changes involving the left hip joint osteophytic spurring and some protrusio acetabuli. There are changes of Paget's disease involving the left hemipelvis which is stable compared to prior CT scans dating back to 2010. No acute changes  Subjective: No chief complaint on file.   MRI results from pelvis review   Chronic pain of left knee   Decreased ROM of lower extremity   Primary osteoarthritis of left hip    Kendra Maldonado relates that she has done well with the cortisone injection of her left knee. There was a concern regarding some bony changes on her pelvis films. An MRI scan was performed with the results as above. She has Paget's disease WHICH is stable from prior CT scans 2010. She does have an issue with balance and uses either a walker or cane for ambulation but does not complain of significant pain at this point.  Review of Systems   Objective: Vital Signs: There were no vitals taken for this  visit.  Physical Exam  Ortho Exam left knee with very minimal effusion. No significant medial or lateral joint pain . No instability. Axis over 100 with full extension. No calf pain.  Decreased range of motion of both hips with internal/external rotation but without significant pain.  Specialty Comments:  No specialty comments available.  Imaging: No results found.   PMFS History: Patient Active Problem List   Diagnosis Date Noted  . HLD (hyperlipidemia) 04/05/2015  . Cerebrovascular accident (CVA) due to occlusion of right cerebellar artery ([place])   . Stroke ([place]) 04/04/2015  . Cerebellar stroke, acute ([place]) 04/04/2015  . Hypertension 04/04/2015  . Stroke (cerebrum) ([place]) 04/04/2015  . Type 2 diabetes mellitus with vascular disease ([place]) 04/04/2015  . Anemia due to GI blood loss   . Symptomatic anemia   . Acute posthemorrhagic anemia 12/09/2013  . Upper GI bleed 12/09/2013  . Back pain 12/09/2013  . Chronic diarrhea 12/09/2013   Past Medical History:  Diagnosis Date  . Back pain   . Breast cancer, left breast ([place]) 2010   lumpectomy  . Chronic diarrhea   . Diabetes mellitus type 2, uncontrolled ([place])   . Essential hypertension   . Hiatal hernia    Stenosis of the spine  . Spinal stenosis   . Stroke [facility]) 03/2015    Family History  Problem Relation Age of Onset  .  Hypertension Mother   . Dementia Father   . Cancer Brother     pancreas    Past Surgical History:  Procedure Laterality Date  . BREAST SURGERY    . CHOLECYSTECTOMY    . ESOPHAGOGASTRODUODENOSCOPY (EGD) WITH PROPOFOL N/A 12/10/2013   Procedure: ESOPHAGOGASTRODUODENOSCOPY (EGD) WITH PROPOFOL;  Surgeon: Jeryl Columbia, MD;  Location: [facility] ENDOSCOPY;  Service: Endoscopy;  Laterality: N/A;  . HEMORRHOID SURGERY    . PARTIAL HYSTERECTOMY     Social History   Occupational History  . Retired     Financial risk analyst, Orthoptist   Social History Main Topics  . Smoking status: Never Smoker  . Smokeless tobacco: Never Used    . Alcohol use No  . Drug use: No  . Sexual activity: Not on file

## 2016-05-02 ENCOUNTER — Telehealth: Payer: Self-pay | Admitting: Hematology

## 2016-05-02 NOTE — Telephone Encounter (Signed)
Lft a vm to reschedule appt from 4/10

## 2016-05-03 ENCOUNTER — Encounter: Payer: Medicare Other | Admitting: Oncology

## 2016-05-08 ENCOUNTER — Encounter: Payer: Self-pay | Admitting: Internal Medicine

## 2016-05-13 ENCOUNTER — Telehealth: Payer: Self-pay | Admitting: Medical Oncology

## 2016-05-13 NOTE — Telephone Encounter (Signed)
New heme appt wed. Asking for another MD due to her trouble hearing and language accent barrier.

## 2016-05-14 NOTE — Telephone Encounter (Signed)
I do not have to see her. I do not know the patient and never spoke to her before. She can call the new patient referral and as for a different doctor. I do not need to see her.

## 2016-05-15 ENCOUNTER — Encounter: Payer: Self-pay | Admitting: Hematology and Oncology

## 2016-05-15 ENCOUNTER — Encounter: Payer: Medicare Other | Admitting: Internal Medicine

## 2016-05-15 ENCOUNTER — Ambulatory Visit (HOSPITAL_BASED_OUTPATIENT_CLINIC_OR_DEPARTMENT_OTHER): Payer: Medicare Other | Admitting: Hematology and Oncology

## 2016-05-15 DIAGNOSIS — D472 Monoclonal gammopathy: Secondary | ICD-10-CM | POA: Diagnosis not present

## 2016-05-15 NOTE — Progress Notes (Signed)
[place] NOTE  Patient Care Team: Lavone Orn, MD as PCP - General (Internal Medicine)  CHIEF COMPLAINTS/PURPOSE OF CONSULTATION:  Elevated monoclonal protein  HISTORY OF PRESENTING ILLNESS:  Kendra Maldonado 83 y.o. female is here because of recent diagnosis of anemia and elevated monoclonal protein. Since January 2018, patient has had anemia issues. She was put on oral iron therapy and appears to have had remarkable improvement in her hemoglobin levels. Because she also has chronic kidney disease, workup with serum protein electrophoresis was performed. This revealed a small elevation of monoclonal protein of 0.3 g which was IgA kappa. She also had Kappa:Lambda ratio which was mildly elevated at 1.9. Because of these findings she was recommended to see [place].  I reviewed her records extensively and collaborated the history with the patient.  MEDICAL HISTORY:  Past Medical History:  Diagnosis Date  . Back pain   . Breast cancer, left breast ([place]) 2010   lumpectomy  . Chronic diarrhea   . Diabetes mellitus type 2, uncontrolled ([place])   . Essential hypertension   . Hiatal hernia    Stenosis of the spine  . Spinal stenosis   . Stroke [facility]) 03/2015    SURGICAL HISTORY: Past Surgical History:  Procedure Laterality Date  . BREAST SURGERY    . CHOLECYSTECTOMY    . ESOPHAGOGASTRODUODENOSCOPY (EGD) WITH PROPOFOL N/A 12/10/2013   Procedure: ESOPHAGOGASTRODUODENOSCOPY (EGD) WITH PROPOFOL;  Surgeon: Jeryl Columbia, MD;  Location: [facility] ENDOSCOPY;  Service: Endoscopy;  Laterality: N/A;  . HEMORRHOID SURGERY    . PARTIAL HYSTERECTOMY      SOCIAL HISTORY: Social History   Social History  . Marital status: Married    Spouse name: N/A  . Number of children: 2  . Years of education: 12   Occupational History  . Retired     Financial risk analyst, Orthoptist   Social History Main Topics  . Smoking status: Never Smoker  . Smokeless tobacco: Never Used  . Alcohol use No  . Drug use: No   . Sexual activity: Not on file   Other Topics Concern  . Not on file   Social History Narrative   Widowed, lives home alone   Has 2 sons and 4 grandchildren   Right-handed   Rarely drinks caffeine    FAMILY HISTORY: Family History  Problem Relation Age of Onset  . Hypertension Mother   . Dementia Father   . Cancer Brother     pancreas    ALLERGIES:  is allergic to codeine and demerol [meperidine].  MEDICATIONS:  Current Outpatient Prescriptions  Medication Sig Dispense Refill  . amLODipine (NORVASC) 5 MG tablet Take 5 mg by mouth daily.    Marland Kitchen aspirin EC 81 MG EC tablet Take 1 tablet (81 mg total) by mouth daily. 30 tablet 1  . atorvastatin (LIPITOR) 40 MG tablet Take 1 tablet (40 mg total) by mouth daily at 6 PM. 30 tablet 1  . Cholecalciferol (D 2000) 2000 UNITS TABS Take 2,000 Units by mouth daily.    . clopidogrel (PLAVIX) 75 MG tablet Take 1 tablet (75 mg total) by mouth daily. 30 tablet 1  . diphenoxylate-atropine (LOMOTIL) 2.5-0.025 MG per tablet Take 2 tablets by mouth daily as needed for diarrhea or loose stools.     . ferrous sulfate 325 (65 FE) MG tablet Take 1 tablet (325 mg total) by mouth daily with breakfast. (Patient taking differently: Take 325 mg by mouth 2 (two) times daily with a meal. )  30 tablet 3  . gabapentin (NEURONTIN) 300 MG capsule Take 300 mg by mouth.    . losartan (COZAAR) 100 MG tablet Take 100 mg by mouth.    . metFORMIN (GLUCOPHAGE) 500 MG tablet Take 500 mg by mouth 2 (two) times daily.    . metoprolol tartrate (LOPRESSOR) 25 MG tablet Take 0.5 tablets (12.5 mg total) by mouth 2 (two) times daily. 60 tablet 1  . omeprazole (PRILOSEC) 20 MG capsule Take 20 mg by mouth daily.     No current facility-administered medications for this visit.     REVIEW OF SYSTEMS:   Constitutional: Denies fevers, chills or abnormal night sweats Eyes: Denies blurriness of vision, double vision or watery eyes Ears, nose, mouth, throat, and face: Denies  mucositis or sore throat Respiratory: Denies cough, dyspnea or wheezes Cardiovascular: Denies palpitation, chest discomfort or lower extremity swelling Gastrointestinal:  Denies nausea, heartburn or change in bowel habits Skin: Denies abnormal skin rashes Lymphatics: Denies new lymphadenopathy or easy bruising Neurological:Denies numbness, tingling or new weaknesses Behavioral/Psych: Mood is stable, no new changes   All other systems were reviewed with the patient and are negative.  PHYSICAL EXAMINATION: ECOG PERFORMANCE STATUS: 2 - Symptomatic, <50% confined to bed  Vitals:   05/15/16 1514  BP: (!) 170/89  Pulse: 65  Resp: 18  Temp: 97.6 F (36.4 C)   Filed Weights   05/15/16 1514  Weight: 168 lb 1.6 oz (76.2 kg)    GENERAL:alert, no distress and comfortable SKIN: skin color, texture, turgor are normal, no rashes or significant lesions EYES: normal, conjunctiva are pink and non-injected, sclera clear OROPHARYNX:no exudate, no erythema and lips, buccal mucosa, and tongue normal  NECK: supple, thyroid normal size, non-tender, without nodularity LYMPH:  no palpable lymphadenopathy in the cervical, axillary or inguinal LUNGS: clear to auscultation and percussion with normal breathing effort HEART: regular rate & rhythm and no murmurs and no lower extremity edema ABDOMEN:abdomen soft, non-tender and normal bowel sounds Musculoskeletal:no cyanosis of digits and no clubbing  PSYCH: alert & oriented x 3 with fluent speech NEURO: no focal motor/sensory deficits   LABORATORY DATA:  I have reviewed the data as listed Lab Results  Component Value Date   WBC 5.2 02/14/2016   HGB 7.4 (L) 02/14/2016   HCT 24.7 (L) 02/14/2016   MCV 98.0 02/14/2016   PLT 258 02/14/2016   Lab Results  Component Value Date   NA 143 02/14/2016   K 4.8 02/14/2016   CL 111 (H) 02/14/2016   CO2 22 02/14/2016    RADIOGRAPHIC STUDIES: I have personally reviewed the radiological reports and agreed  with the findings in the report.  ASSESSMENT AND PLAN:  MGUS (monoclonal gammopathy of unknown significance) IgA kappa monoclonal protein 0.3 g diagnosed January 2018 When the patient presented with severe anemia. The anemia was felt to be related to acute blood loss. Workup in February revealed free Kappa 61.2 elevated and free lambda 30.7 also elevated ratio 1.99 Hemoglobin 9.1 02/27/2016 with an MCV of 97.6, iron saturation 6% and ferritin 18.7, creatinine 1.4, calcium 9.4  Polyclonal elevation of light chains does not indicate a monoclonal gammopathy. Based upon the slight increase in the IgA kappa I suspect she has MGUS. Based on her elderly age, I did not recommend doing a bone marrow biopsy.  Most recent blood work on 04/29/2016: Hemoglobin 11.7, creatinine 1.08, MCV 91,, calcium 9.5 These lab results suggest marked improvement in the anemia.  I recommended a six-month follow-up labs including  serum protein electrophoresis and immunofixation along with Lambda ratio. Patient does not wish to come back to [place] for follow-up. She would ask her primary care physician to draw her labs in 6 months. I discussed with her that if the M protein increases to more than 1 g, we may need to see her sooner. However If the MGUS remains stable, we may not need to see her again.    All questions were answered. The patient knows to call the clinic with any problems, questions or concerns.    Rulon Eisenmenger, MD 05/15/16

## 2016-05-15 NOTE — Assessment & Plan Note (Signed)
IgA kappa monoclonal protein 0.3 g diagnosed January 2018 When the patient presented with severe anemia. The anemia was felt to be related to acute blood loss. Workup in February revealed free Kappa 61.2 elevated and free lambda 30.7 also elevated ratio 1.99 Hemoglobin 9.1 02/27/2016 with an MCV of 97.6, iron saturation 6% and ferritin 18.7, creatinine 1.4, calcium 9.4  Polyclonal elevation of light chains does not indicate a monoclonal gammopathy. Based upon the slight increase in the IgA kappa I suspect she has MGUS. Based on her elderly age, I did not recommend doing a bone marrow biopsy.  Most recent blood work on 04/29/2016: Hemoglobin 11.7, creatinine 1.08, MCV 91,, calcium 9.5 These lab results suggest marked improvement in the anemia.  I recommended a six-month follow-up labs including serum protein electrophoresis and immunofixation along with Lambda ratio. If the MGUS remains stable, we can see her once a year.

## 2016-11-05 ENCOUNTER — Ambulatory Visit (INDEPENDENT_AMBULATORY_CARE_PROVIDER_SITE_OTHER): Payer: Medicare Other | Admitting: Orthopaedic Surgery

## 2016-11-05 ENCOUNTER — Encounter (INDEPENDENT_AMBULATORY_CARE_PROVIDER_SITE_OTHER): Payer: Self-pay | Admitting: Orthopaedic Surgery

## 2016-11-05 VITALS — BP 178/88 | HR 77 | Resp 14 | Ht 66.0 in | Wt 170.0 lb

## 2016-11-05 DIAGNOSIS — M1712 Unilateral primary osteoarthritis, left knee: Secondary | ICD-10-CM

## 2016-11-05 MED ORDER — METHYLPREDNISOLONE ACETATE 40 MG/ML IJ SUSP
80.0000 mg | INTRAMUSCULAR | Status: AC | PRN
  Administered 2016-11-05: 80 mg

## 2016-11-05 MED ORDER — LIDOCAINE HCL 1 % IJ SOLN
5.0000 mL | INTRAMUSCULAR | Status: AC | PRN
  Administered 2016-11-05: 5 mL

## 2016-11-05 MED ORDER — BUPIVACAINE HCL 0.5 % IJ SOLN
3.0000 mL | INTRAMUSCULAR | Status: AC | PRN
  Administered 2016-11-05: 3 mL via INTRA_ARTICULAR

## 2016-11-05 NOTE — Progress Notes (Signed)
Office Visit Note   Patient: Kendra Maldonado           Date of Birth: [birth year]           MRN: [id number] Visit Date: 11/05/2016              Requested by: Lavone Orn, MD [address] [place], [place] [address] PCP: Lavone Orn, MD   Assessment & Plan: Visit Diagnoses:  1. Unilateral primary osteoarthritis, left knee     Plan: Long discussion regarding her osteoarthritis. Also has arthritis of both of her hips which appear to be asymptomatic. We will reinject left knee with cortisone and monitor her response  Follow-Up Instructions: Return if symptoms worsen or fail to improve.   Orders:  No orders of the defined types were placed in this encounter.  No orders of the defined types were placed in this encounter.     Procedures: Large Joint Inj Date/Time: 11/05/2016 3:11 PM Performed by: Garald Balding Authorized by: Garald Balding   Consent Given by:  Patient Timeout: prior to procedure the correct patient, procedure, and site was verified   Indications:  Pain and joint swelling Location:  Knee Site:  L knee Prep: patient was prepped and draped in usual sterile fashion   Needle Size:  25 G Needle Length:  1.5 inches Approach:  Anteromedial Ultrasound Guidance: No   Fluoroscopic Guidance: No   Arthrogram: No   Medications:  5 mL lidocaine 1 %; 80 mg methylPREDNISolone acetate 40 MG/ML; 3 mL bupivacaine 0.5 % Aspiration Attempted: No   Patient tolerance:  Patient tolerated the procedure well with no immediate complications     Clinical Data: No additional findings.   Subjective: Chief Complaint  Patient presents with  . Left Knee - Pain, Edema    Kendra Maldonado is a 83y o that presents with chronic left knee pain. Hx of stroke 2 yrs ago. She relates sometimes she feels like it might "give way" on her and she ambulates with a cane.  Last seen in January or February 2018. Had a cortisone injection left knee that made a difference still  "just recently. She has evidence of Paget's disease which apparently is asymptomatic. Also has bilateral hip osteoarthritis. She does have an issue with balance and uses a 4 point cane. She just recently developed some recurrent pain in her left knee to the point of compromise.  HPI  Review of Systems  Constitutional: Negative for chills, fatigue and fever.  Eyes: Negative for itching.  Respiratory: Negative for chest tightness and shortness of breath.   Cardiovascular: Negative for chest pain, palpitations and leg swelling.  Gastrointestinal: Negative for blood in stool, constipation and diarrhea.  Endocrine: Negative for polyuria.  Genitourinary: Negative for dysuria.  Musculoskeletal: Negative for back pain, joint swelling, neck pain and neck stiffness.  Allergic/Immunologic: Negative for immunocompromised state.  Neurological: Positive for numbness. Negative for dizziness.  Hematological: Does not bruise/bleed easily.  Psychiatric/Behavioral: The patient is not nervous/anxious.      Objective: Vital Signs: BP (!) 178/88   Pulse 77   Resp 14   Ht 5\' 6"  (1.676 m)   Wt 170 lb (77.1 kg)   BMI 27.44 kg/m   Physical Exam  Ortho Exam awake alert and oriented 3. Comfortable sitting. Does use a cane for ambulation with a short-based gait. Little hard of hearing. No shortness of breath. Minimal effusion left knee. Predominantly medial joint pain. Lacked a few degrees to full  extension and flexed over 100 without instability. No calf pain. Neurovascular exam is intact leg raise negative. Does have some decreased range of motion of both hips with internal/external rotation more so left than right consistent with the osteoarthritis. But minimal discomfort  Specialty Comments:  No specialty comments available.  Imaging: No results found.   PMFS History: Patient Active Problem List   Diagnosis Date Noted  . MGUS (monoclonal gammopathy of unknown significance) 05/15/2016  . HLD  (hyperlipidemia) 04/05/2015  . Cerebrovascular accident (CVA) due to occlusion of right cerebellar artery ([place])   . Stroke ([place]) 04/04/2015  . Cerebellar stroke, acute ([place]) 04/04/2015  . Hypertension 04/04/2015  . Stroke (cerebrum) ([place]) 04/04/2015  . Type 2 diabetes mellitus with vascular disease ([place]) 04/04/2015  . Anemia due to GI blood loss   . Symptomatic anemia   . Acute posthemorrhagic anemia 12/09/2013  . Upper GI bleed 12/09/2013  . Back pain 12/09/2013  . Chronic diarrhea 12/09/2013   Past Medical History:  Diagnosis Date  . Back pain   . Breast cancer, left breast ([place]) 2010   lumpectomy  . Chronic diarrhea   . Diabetes mellitus type 2, uncontrolled ([place])   . Essential hypertension   . Hiatal hernia    Stenosis of the spine  . Spinal stenosis   . Stroke [facility]) 03/2015    Family History  Problem Relation Age of Onset  . Hypertension Mother   . Dementia Father   . Cancer Brother        pancreas    Past Surgical History:  Procedure Laterality Date  . BREAST SURGERY    . CHOLECYSTECTOMY    . ESOPHAGOGASTRODUODENOSCOPY (EGD) WITH PROPOFOL N/A 12/10/2013   Procedure: ESOPHAGOGASTRODUODENOSCOPY (EGD) WITH PROPOFOL;  Surgeon: Jeryl Columbia, MD;  Location: [facility] ENDOSCOPY;  Service: Endoscopy;  Laterality: N/A;  . HEMORRHOID SURGERY    . PARTIAL HYSTERECTOMY     Social History   Occupational History  . Retired     Financial risk analyst, Orthoptist   Social History Main Topics  . Smoking status: Never Smoker  . Smokeless tobacco: Never Used  . Alcohol use No  . Drug use: No  . Sexual activity: Not on file

## 2016-11-25 ENCOUNTER — Telehealth: Payer: Self-pay

## 2016-11-25 NOTE — Telephone Encounter (Signed)
Patient would like a call back.  CB# is [phone] or [phone].  Thank you.

## 2016-11-26 NOTE — Telephone Encounter (Signed)
Pt   has questions regarding knee surgery. Injection 3 weeks ago, told her she would have to wait 2 months before surgery.

## 2016-12-16 ENCOUNTER — Emergency Department (HOSPITAL_COMMUNITY)
Admission: EM | Admit: 2016-12-16 | Discharge: 2016-12-16 | Disposition: A | Discharge status: Home / Self Care | Payer: Medicare Other | Attending: Emergency Medicine | Admitting: Emergency Medicine

## 2016-12-16 ENCOUNTER — Encounter (HOSPITAL_COMMUNITY): Payer: Self-pay

## 2016-12-16 DIAGNOSIS — M545 Low back pain, unspecified: Secondary | ICD-10-CM

## 2016-12-16 DIAGNOSIS — Z79899 Other long term (current) drug therapy: Secondary | ICD-10-CM | POA: Diagnosis not present

## 2016-12-16 DIAGNOSIS — Z7902 Long term (current) use of antithrombotics/antiplatelets: Secondary | ICD-10-CM | POA: Diagnosis not present

## 2016-12-16 DIAGNOSIS — Z8673 Personal history of transient ischemic attack (TIA), and cerebral infarction without residual deficits: Secondary | ICD-10-CM | POA: Diagnosis not present

## 2016-12-16 DIAGNOSIS — Z853 Personal history of malignant neoplasm of breast: Secondary | ICD-10-CM | POA: Insufficient documentation

## 2016-12-16 DIAGNOSIS — Z7982 Long term (current) use of aspirin: Secondary | ICD-10-CM | POA: Diagnosis not present

## 2016-12-16 DIAGNOSIS — E119 Type 2 diabetes mellitus without complications: Secondary | ICD-10-CM | POA: Insufficient documentation

## 2016-12-16 DIAGNOSIS — I10 Essential (primary) hypertension: Secondary | ICD-10-CM | POA: Diagnosis not present

## 2016-12-16 DIAGNOSIS — Z7984 Long term (current) use of oral hypoglycemic drugs: Secondary | ICD-10-CM | POA: Insufficient documentation

## 2016-12-16 LAB — URINALYSIS, ROUTINE W REFLEX MICROSCOPIC
Bilirubin Urine: NEGATIVE
Glucose, UA: NEGATIVE mg/dL
Hgb urine dipstick: NEGATIVE
Ketones, ur: NEGATIVE mg/dL
Nitrite: POSITIVE — AB
Protein, ur: NEGATIVE mg/dL
Specific Gravity, Urine: 1.011 (ref 1.005–1.030)
pH: 6 (ref 5.0–8.0)

## 2016-12-16 MED ORDER — DEXTROSE 5 % IV SOLN
1.0000 g | Freq: Once | INTRAVENOUS | Status: AC
  Administered 2016-12-16: 1 g via INTRAVENOUS
  Filled 2016-12-16: qty 10

## 2016-12-16 MED ORDER — KETOROLAC TROMETHAMINE 15 MG/ML IJ SOLN
15.0000 mg | Freq: Once | INTRAMUSCULAR | Status: AC
  Administered 2016-12-16: 15 mg via INTRAVENOUS
  Filled 2016-12-16: qty 1

## 2016-12-16 MED ORDER — CEPHALEXIN 500 MG PO CAPS: 500.0000 mg | ORAL_CAPSULE | Freq: Three times a day (TID) | ORAL | 0 refills | Status: DC

## 2016-12-16 MED ORDER — HYDROMORPHONE HCL 1 MG/ML IJ SOLN
0.5000 mg | Freq: Once | INTRAMUSCULAR | Status: AC
  Administered 2016-12-16: 0.5 mg via INTRAVENOUS
  Filled 2016-12-16: qty 1

## 2016-12-16 MED ORDER — TRAMADOL HCL 50 MG PO TABS: 50.0000 mg | ORAL_TABLET | Freq: Four times a day (QID) | ORAL | 0 refills | Status: DC | PRN

## 2016-12-16 MED ORDER — LORAZEPAM 2 MG/ML IJ SOLN
0.5000 mg | Freq: Once | INTRAMUSCULAR | Status: AC
  Administered 2016-12-16: 0.5 mg via INTRAVENOUS
  Filled 2016-12-16: qty 1

## 2016-12-16 MED ORDER — SODIUM CHLORIDE 0.9 % IV SOLN
INTRAVENOUS | Status: DC
  Administered 2016-12-16: 08:00:00 via INTRAVENOUS

## 2016-12-16 NOTE — ED Provider Notes (Signed)
[place] DEPT Provider Note   CSN: [id number] Arrival date & time: 12/16/16  7622     History   Chief Complaint Chief Complaint  Patient presents with  . Back Pain    HPI Kendra Maldonado is a 83 y.o. female.  HPI   83 year old female with back pain.  Acute on chronic.  Pain is in the mid to lower back.  She reports a history of spinal stenosis and says that this pain feels similar although stronger in intensity.  Pain began worsening on Tuesday.  She has been using a heating pad and Tylenol w/o much improvement. No urinary complaints. No blood thinners. NO previuos back surgery.   Past Medical History:  Diagnosis Date  . Back pain   . Breast cancer, left breast ([place]) 2010   lumpectomy  . Chronic diarrhea   . Diabetes mellitus type 2, uncontrolled ([place])   . Essential hypertension   . Hiatal hernia    Stenosis of the spine  . Spinal stenosis   . Stroke [facility]) 03/2015    Patient Active Problem List   Diagnosis Date Noted  . MGUS (monoclonal gammopathy of unknown significance) 05/15/2016  . HLD (hyperlipidemia) 04/05/2015  . Cerebrovascular accident (CVA) due to occlusion of right cerebellar artery ([employer])   . Stroke ([place]) 04/04/2015  . Cerebellar stroke, acute ([place]) 04/04/2015  . Hypertension 04/04/2015  . Stroke (cerebrum) ([place]) 04/04/2015  . Type 2 diabetes mellitus with vascular disease ([place]) 04/04/2015  . Anemia due to GI blood loss   . Symptomatic anemia   . Acute posthemorrhagic anemia 12/09/2013  . Upper GI bleed 12/09/2013  . Back pain 12/09/2013  . Chronic diarrhea 12/09/2013    Past Surgical History:  Procedure Laterality Date  . BREAST SURGERY    . CHOLECYSTECTOMY    . ESOPHAGOGASTRODUODENOSCOPY (EGD) WITH PROPOFOL N/A 12/10/2013   Procedure: ESOPHAGOGASTRODUODENOSCOPY (EGD) WITH PROPOFOL;  Surgeon: Jeryl Columbia, MD;  Location: [facility] ENDOSCOPY;  Service: Endoscopy;  Laterality: N/A;  . HEMORRHOID SURGERY    . PARTIAL  HYSTERECTOMY      OB History    No data available       Home Medications    Prior to Admission medications   Medication Sig Start Date End Date Taking? Authorizing Provider  amLODipine (NORVASC) 5 MG tablet Take 5 mg by mouth daily. 03/25/15   [provider]  aspirin EC 81 MG EC tablet Take 1 tablet (81 mg total) by mouth daily. 04/06/15   Hosie Poisson, MD  atorvastatin (LIPITOR) 40 MG tablet Take 1 tablet (40 mg total) by mouth daily at 6 PM. Patient not taking: Reported on 11/05/2016 04/06/15   Hosie Poisson, MD  Cholecalciferol (D 2000) 2000 UNITS TABS Take 2,000 Units by mouth daily.    [provider]  clopidogrel (PLAVIX) 75 MG tablet Take 1 tablet (75 mg total) by mouth daily. Patient not taking: Reported on 11/05/2016 04/06/15   Hosie Poisson, MD  diphenoxylate-atropine (LOMOTIL) 2.5-0.025 MG per tablet Take 2 tablets by mouth daily as needed for diarrhea or loose stools.  09/20/13   [provider]  ferrous sulfate 325 (65 FE) MG tablet Take 1 tablet (325 mg total) by mouth daily with breakfast. Patient taking differently: Take 325 mg by mouth 2 (two) times daily with a meal.  12/11/13   Sid Falcon, MD  gabapentin (NEURONTIN) 300 MG capsule Take 300 mg by mouth.    [provider]  losartan (COZAAR) 100  MG tablet Take 100 mg by mouth.    [provider]  metFORMIN (GLUCOPHAGE) 500 MG tablet Take 500 mg by mouth 2 (two) times daily. 10/07/13   [provider]  metoprolol tartrate (LOPRESSOR) 25 MG tablet Take 0.5 tablets (12.5 mg total) by mouth 2 (two) times daily. Patient not taking: Reported on 11/05/2016 04/06/15   Hosie Poisson, MD  omeprazole (PRILOSEC) 20 MG capsule Take 20 mg by mouth daily.    [provider]    Family History Family History  Problem Relation Age of Onset  . Hypertension Mother   . Dementia Father   . Cancer Brother        pancreas    Social History Social History   Tobacco Use    . Smoking status: Never Smoker  . Smokeless tobacco: Never Used  Substance Use Topics  . Alcohol use: No  . Drug use: No     Allergies   Codeine and Demerol [meperidine]   Review of Systems Review of Systems   All systems reviewed and negative, other than as noted in HPI.   All systems reviewed and negative, other than as noted in HPI. Physical Exam Updated Vital Signs BP (!) 162/70 (BP Location: Left Arm)   Pulse 74   Temp 98 F (36.7 C) (Oral)   Resp 16   SpO2 97%   Physical Exam  Constitutional: She appears well-developed and well-nourished. No distress.  HENT:  Head: Normocephalic and atraumatic.  Eyes: Conjunctivae are normal. Right eye exhibits no discharge. Left eye exhibits no discharge.  Neck: Neck supple.  Cardiovascular: Normal rate, regular rhythm and normal heart sounds. Exam reveals no gallop and no friction rub.  No murmur heard. Pulmonary/Chest: Effort normal and breath sounds normal. No respiratory distress.  Abdominal: Soft. She exhibits no distension. There is no tenderness.  Musculoskeletal: She exhibits no edema or tenderness.  Mild paraspinal right upper thoracic lower thoracic/upper lumbar region.  No midline spinal tenderness.  Some erythema and small area of denuded skin.  Likely from overuse of heating pad.  No herpetic appearing lesions.  Does not seem like cellulitis.  Strength 5 out of 5 bilateral lower extremities.  Sensation is intact to light touch.  Palpable DP pulses.  Patient was ambulated in the emergency room without apparent difficulty.  Neurological: She is alert.  Skin: Skin is warm and dry.  Psychiatric: She has a normal mood and affect. Her behavior is normal. Thought content normal.  Nursing note and vitals reviewed.     ED Treatments / Results  Labs (all labs ordered are listed, but only abnormal results are displayed) Labs Reviewed - No data to display  EKG  EKG Interpretation None       Radiology No results  found.  Procedures Procedures (including critical care time)  Medications Ordered in ED Medications - No data to display   Initial Impression / Assessment and Plan / ED Course  I have reviewed the triage vital signs and the nursing notes.  Pertinent labs & imaging results that were available during my care of the patient were reviewed by me and considered in my medical decision making (see chart for details).     83 year old female with acute on chronic lower back pain.  No concerning red flags.  She does have some evidence of some mild thermal injury to back from heating bad.  Discussed safe usage.  Social work/case management met with patient for additional resources in the home.  Plan  continue symptomatic treatment of likely muscular skeletal pain.   Final Clinical Impressions(s) / ED Diagnoses   Final diagnoses:  Low back pain without sciatica, unspecified back pain laterality, unspecified chronicity    ED Discharge Orders    None       Virgel Manifold, MD 12/17/16 1045

## 2016-12-16 NOTE — Clinical Social Work Note (Signed)
Clinical Social Work Assessment  Patient Details  Name: Kendra Maldonado MRN: [id number] Date of Birth: July 25, 1933  Date of referral:  12/16/16               Reason for consult:  Discharge Planning                Permission sought to share information with:    Permission granted to share information::     Name::        Agency::     Relationship::     Contact Information:     Housing/Transportation Living arrangements for the past 2 months:  Single Family Home Source of Information:  Patient Patient Interpreter Needed:  None Criminal Activity/Legal Involvement Pertinent to Current Situation/Hospitalization:    Significant Relationships:    Lives with:  Self Do you feel safe going back to the place where you live?    Need for family participation in patient care:     Care giving concerns:  CSW consulted due to patient having concerns with returning home.    Social Worker assessment / plan:  CSW met with patient via bedside to discuss discharge planning. Patient currently lives alone and states she feels "unsafe" to return home because she is there by herself. CSW questioned how patient typically gets around throughout the day- patient stated she uses her walker however does have some difficulty. Per notes, patient was ambulating without difficultly in ED. CSW questioned if patient would feel more comfortable discharging home if she had additional assistance- patient stated yes. CSW updated EDP and RN to assist with any HH needs patient may have.   Employment status:  Retired Nurse, adult PT Recommendations:  Not assessed at this time Information / Referral to community resources:     Patient/Family's Response to care:  Patient appreciated CSW.   Patient/Family's Understanding of and Emotional Response to Diagnosis, Current Treatment, and Prognosis:  Unknown at this time.   Emotional Assessment Appearance:  Appears stated age Attitude/Demeanor/Rapport:     Affect (typically observed):  Calm, Appropriate, Anxious Orientation:  Oriented to Self, Oriented to Place, Oriented to  Time, Oriented to Situation Alcohol / Substance use:    Psych involvement (Current and /or in the community):  No (Comment)  Discharge Needs  Concerns to be addressed:  No discharge needs identified Readmission within the last 30 days:  No Current discharge risk:  None Barriers to Discharge:  No Barriers Identified   Weston Anna, LCSW 12/16/2016, 10:52 AM

## 2016-12-16 NOTE — ED Notes (Signed)
Patient reports calling son to come pick her up. Waiting for prescriptions to discharge patient.

## 2016-12-16 NOTE — Care Management Note (Addendum)
Case Management Note  Patient Details  Name: Kendra Maldonado MRN: [id number] Date of Birth: [birth year]  Subjective/Objective:                  83 year old female with back pain.  Action/Plan: CM consulted for HHS.  Spoke with pt who states she would like to use [facility].  Contacted [facility] who states they do not have availability to accept pt starting this week.  Contacted Tim with [facility] who could not accept the pt.  Crystal with [facility] has accepted the pt. Pt reports she has a walker and cane.  No further CM needs noted at this time.  Expected Discharge Date:   12/16/2016               Expected Discharge Plan:  [facility]  In-House Referral:  Clinical Social Work  Discharge planning Services  CM Consult  Post Acute Care Choice:  Home Health Choice offered to:  Patient  HH Arranged:  RN, PT, OT, Nurse's Aide, Social Work [employer] Agency:  [employer]  Status of Service:  Completed, signed off  Cayce Quezada, Benjaman Lobe, RN 12/16/2016, 11:33 AM

## 2016-12-16 NOTE — ED Notes (Signed)
Pt ambulated with little assistance.

## 2016-12-16 NOTE — ED Triage Notes (Signed)
Pt complains of chronic back pain, pain is worse since last Tuesday, no new injury Pt didn't sleep last night

## 2016-12-16 NOTE — ED Notes (Signed)
Patient observed ambulating in hall without difficulty.

## 2016-12-16 NOTE — ED Notes (Signed)
Bed: WA19 Expected date:  Expected time:  Means of arrival:  Comments: 

## 2016-12-16 NOTE — ED Triage Notes (Signed)
Pt received 150 mcg of fentanyl per EMS

## 2016-12-17 ENCOUNTER — Telehealth (INDEPENDENT_AMBULATORY_CARE_PROVIDER_SITE_OTHER): Payer: Self-pay | Admitting: Physical Medicine and Rehabilitation

## 2016-12-17 NOTE — Telephone Encounter (Signed)
Left message for patient to call back to discuss.

## 2016-12-17 NOTE — Telephone Encounter (Signed)
Patient says she is having leg pain but she is no longer taking Plavix. Scheduled for 12/23/16 at 1400.

## 2016-12-17 NOTE — Telephone Encounter (Signed)
Has facet OA and stenosis at L4-5 from 2014 MRI, if all back pain then Facet block and can stay on Plavix, if shooting ot sever leg pain then bilat L4 tf esi. Need to be off BT

## 2016-12-19 LAB — URINE CULTURE: Culture: >=100000 — AB

## 2016-12-20 ENCOUNTER — Telehealth: Payer: Self-pay | Admitting: *Deleted

## 2016-12-20 NOTE — Telephone Encounter (Signed)
Post ED Visit - Positive Culture Follow-up  Culture report reviewed by antimicrobial stewardship pharmacist:  []  Elenor Quinones, Pharm.D. []  Heide Guile, Pharm.D., BCPS AQ-ID []  Parks Neptune, Pharm.D., BCPS []  Alycia Rossetti, Pharm.D., BCPS []  [place], [place].D., BCPS, AAHIVP []  Legrand Como, Pharm.D., BCPS, AAHIVP []  Salome Arnt, PharmD, BCPS []  Dimitri Ped, PharmD, BCPS []  Vincenza Hews, PharmD, BCPS Ulanda Edison, PharmD  Positive urine culture Treated with Cephalexin, organism sensitive to the same and no further patient follow-up is required at this time.  Harlon Flor [facility] 12/20/2016, 11:41 AM

## 2016-12-23 ENCOUNTER — Ambulatory Visit (INDEPENDENT_AMBULATORY_CARE_PROVIDER_SITE_OTHER): Payer: Medicare Other | Admitting: Physical Medicine and Rehabilitation

## 2016-12-23 ENCOUNTER — Ambulatory Visit (INDEPENDENT_AMBULATORY_CARE_PROVIDER_SITE_OTHER): Payer: Medicare Other

## 2016-12-23 ENCOUNTER — Encounter (INDEPENDENT_AMBULATORY_CARE_PROVIDER_SITE_OTHER): Payer: Self-pay | Admitting: Physical Medicine and Rehabilitation

## 2016-12-23 VITALS — BP 134/64 | HR 109 | Temp 97.6°F

## 2016-12-23 DIAGNOSIS — M25562 Pain in left knee: Secondary | ICD-10-CM | POA: Diagnosis not present

## 2016-12-23 DIAGNOSIS — M542 Cervicalgia: Secondary | ICD-10-CM | POA: Diagnosis not present

## 2016-12-23 DIAGNOSIS — M5416 Radiculopathy, lumbar region: Secondary | ICD-10-CM | POA: Diagnosis not present

## 2016-12-23 DIAGNOSIS — M48062 Spinal stenosis, lumbar region with neurogenic claudication: Secondary | ICD-10-CM

## 2016-12-23 DIAGNOSIS — M546 Pain in thoracic spine: Secondary | ICD-10-CM

## 2016-12-23 DIAGNOSIS — G8929 Other chronic pain: Secondary | ICD-10-CM

## 2016-12-23 DIAGNOSIS — M609 Myositis, unspecified: Secondary | ICD-10-CM

## 2016-12-23 MED ORDER — BETAMETHASONE SOD PHOS & ACET 6 (3-3) MG/ML IJ SUSP
12.0000 mg | Freq: Once | INTRAMUSCULAR | Status: AC
  Administered 2016-12-23: 12 mg

## 2016-12-23 MED ORDER — LIDOCAINE HCL (PF) 1 % IJ SOLN
2.0000 mL | Freq: Once | INTRAMUSCULAR | Status: AC
  Administered 2016-12-23: 2 mL

## 2016-12-23 NOTE — Progress Notes (Deleted)
She just aches all over. + driver, - BT's, - Dye allergy

## 2016-12-23 NOTE — Patient Instructions (Signed)

## 2016-12-27 ENCOUNTER — Encounter (INDEPENDENT_AMBULATORY_CARE_PROVIDER_SITE_OTHER): Payer: Self-pay | Admitting: Physical Medicine and Rehabilitation

## 2016-12-27 NOTE — Progress Notes (Signed)
EXA BOMBA - 83 y.o. female MRN [id number]  Date of birth: Dec 13, 1933  Office Visit Note: Visit Date: 12/23/2016 PCP: Lavone Orn, MD Referred by: Lavone Orn, MD  Subjective: Chief Complaint  Patient presents with  . Lower Back - Pain  . Left Hip - Pain  . Right Hip - Pain  . Left Leg - Pain  . Right Leg - Pain  . Neck - Pain  . Middle Back - Pain   HPI: Kendra Maldonado is a pleasant but complicated 83 year old female with chronic pain syndrome for many years.  She has a complicated medical history including history of breast cancer, MGUS, Paget's disease, diabetes and history of stroke.  She likely also has underlying depression and anxiety which is probably not treated very well.  She comes in today with pain really from her head to her toes.  Her biggest complaints are thoracic right-sided pain at about the bra line.  This is around the T8-T12 region.  She also has lower back pain and pain in the both legs with standing and ambulating.  She is using a cane for ambulation.  She is very emotional downtrodden with the amount of pain that she is having recently went to the emergency department with her back pain.  She reports getting no relief with any medications or activity modifications at this point.  No recent trauma or falls.  She has had a history of falls.  She has been followed by Dr. Durward Fortes for orthopedic complaints and I have seen her on a few occasions with the last occasion being in May 2017 at that point it was right after she had her stroke.  We completed localized muscle injection around the T12 spinous process with some relief of her symptoms.  Past we have completed facet joint blocks in the thoracic spine.  She also has a history of moderate to severe lumbar stenosis at L4-5.  This MRI was from 2014 is reviewed below.  She also has a more recent MRI of the pelvis showing degenerative changes of the left hemipelvis.  Has had no unintended weight loss.  She has had no fevers  chills or night sweats.  Has had no focal weakness but feels tired and weak.  She currently takes gabapentin.  She has not been able to tolerate any form of opioid medication including codeine and Demerol morphine and tramadol.  Not on any present or muscle relaxer.  She is on no norepinephrine modifying agent.  Secondarily she reports hip and knee pain.  This is been followed by [facility] and Dr. Durward Fortes.  She also endorses pain across the upper shoulders and arms.  She does not really endorse numbing or tingling.    Review of Systems  Constitutional: Positive for malaise/fatigue. Negative for chills, fever and weight loss.  HENT: Negative for hearing loss and sinus pain.   Eyes: Negative for blurred vision, double vision and photophobia.  Respiratory: Negative for cough and shortness of breath.   Cardiovascular: Negative for chest pain, palpitations and leg swelling.  Gastrointestinal: Negative for abdominal pain, nausea and vomiting.  Genitourinary: Negative for flank pain.  Musculoskeletal: Positive for back pain, joint pain and neck pain. Negative for myalgias.  Skin: Negative for itching and rash.  Neurological: Positive for weakness. Negative for tremors and focal weakness.  Endo/Heme/Allergies: Negative.   Psychiatric/Behavioral: Negative for depression.  All other systems reviewed and are negative.  Otherwise per HPI.  Assessment & Plan: Visit Diagnoses:  1. Lumbar  radiculopathy   2. Spinal stenosis of lumbar region with neurogenic claudication   3. Chronic right-sided thoracic back pain   4. Chronic pain of left knee   5. Cervicalgia   6. Myofascitis     Plan: Findings:  Chronic pain syndrome and chronic medical condition with probably underlying and not really treated anxiety and depression.  He probably has an underlying fibromyalgia syndrome.  She clearly has lumbar spine problems with multifactorial moderate to severe central stenosis and this was from rest.  She  is obviously could explain some of her back and leg pain.  He does have radicular type pain in the hips and thighs.  The best approach today is bilateral L4 transforaminal epidural steroid injection due to the severity of her symptoms.  She also has mid back or thoracic pain right more than left.  This is likely a combination of facet arthropathy as well as myofascial pain syndrome.  Prior trigger point type injection did seem to help.  Prior facet joint blocks in 2015 were beneficial.  We could look at that again and radiofrequency ablation.  At this point because of her pain level today and she is in the office we will do the injection.  Depending on the outcome I would look at repeat thoracic and lumbar spine MRI.  She will continue to follow with Dr. Durward Fortes and Biagio Borg for her hips and knees.  Lastly, she might do well with the medication such as duloxetine this note should make its way to her primary care physician Dr. Laurann Montana.  They may have already tried something and she has a history of not being tolerant of medications.  It would be worth looking at from a standpoint of helping the anxiety as well as pain level musculoskeletal pain.    Meds & Orders:  Meds ordered this encounter  Medications  . lidocaine (PF) (XYLOCAINE) 1 % injection 2 mL  . betamethasone acetate-betamethasone sodium phosphate (CELESTONE) injection 12 mg    Orders Placed This Encounter  Procedures  . XR C-ARM NO REPORT  . Epidural Steroid injection    Follow-up: Return for right T3-4,T4-5 and T5-6 facet injection.   Procedures: No procedures performed  Lumbosacral Transforaminal Epidural Steroid Injection - Sub-Pedicular Approach with Fluoroscopic Guidance  Patient: Kendra Maldonado      Date of Birth: 1933-09-23 MRN: [id number] PCP: Lavone Orn, MD      Visit Date: 12/23/2016   Universal Protocol:    Date/Time: 12/23/2016  Consent Given By: the patient  Position: PRONE  Additional Comments: Vital  signs were monitored before and after the procedure. Patient was prepped and draped in the usual sterile fashion. The correct patient, procedure, and site was verified.   Injection Procedure Details:  Procedure Site One Meds Administered:  Meds ordered this encounter  Medications  . lidocaine (PF) (XYLOCAINE) 1 % injection 2 mL  . betamethasone acetate-betamethasone sodium phosphate (CELESTONE) injection 12 mg    Laterality: Bilateral  Location/Site:  L4-L5  Needle size: 22 G  Needle type: Spinal  Needle Placement: Transforaminal  Findings:    -Comments: Excellent flow of contrast along the nerve and into the epidural space.  Procedure Details: After squaring off the end-plates to get a true AP view, the C-arm was positioned so that an oblique view of the foramen as noted above was visualized. The target area is just inferior to the "nose of the scotty dog" or sub pedicular. The soft tissues overlying this structure were infiltrated with  2-3 ml. of 1% Lidocaine without Epinephrine.  The spinal needle was inserted toward the target using a "trajectory" view along the fluoroscope beam.  Under AP and lateral visualization, the needle was advanced so it did not puncture dura and was located close the 6 O'Clock position of the pedical in AP tracterory. Biplanar projections were used to confirm position. Aspiration was confirmed to be negative for CSF and/or blood. A 1-2 ml. volume of Isovue-250 was injected and flow of contrast was noted at each level. Radiographs were obtained for documentation purposes.   After attaining the desired flow of contrast documented above, a 0.5 to 1.0 ml test dose of 0.25% Marcaine was injected into each respective transforaminal space.  The patient was observed for 90 seconds post injection.  After no sensory deficits were reported, and normal lower extremity motor function was noted,   the above injectate was administered so that equal amounts of the  injectate were placed at each foramen (level) into the transforaminal epidural space.   Additional Comments:  The patient tolerated the procedure well Dressing: Band-Aid    Post-procedure details: Patient was observed during the procedure. Post-procedure instructions were reviewed.  Patient left the clinic in stable condition.   Pertinent Imaging: MRI of pelvis IMPRESSION: 1. Paget's disease involving the left hemipelvis, unchanged since CT scan 2010. 2. Degenerative changes involving the left hip with mild protrusio. 3. No acute bony findings.   Electronically Signed By: Marijo Sanes M.D. On: 02/23/2016 20:21  Lumbar spine MRI dated 01/09/2013 shows multilevel lumbar spondylosis with moderate facet hypertrophy at L4-5 moderate to severe central stenosis.     Clinical History: MRI of pelvis IMPRESSION: 1. Paget's disease involving the left hemipelvis, unchanged since CT scan 2010. 2. Degenerative changes involving the left hip with mild protrusio. 3. No acute bony findings.   Electronically Signed By: Marijo Sanes M.D. On: 02/23/2016 20:21  Lumbar spine MRI dated 01/09/2013 shows multilevel lumbar spondylosis with moderate facet hypertrophy at L4-5 moderate to severe central stenosis.  She reports that  has never smoked. she has never used smokeless tobacco. No results for input(s): HGBA1C, LABURIC in the last 8760 hours.  Objective:  VS:  HT:    WT:   BMI:     BP:134/64  HR:(!) 109bpm  TEMP:97.6 F (36.4 C)( )  RESP:99 % Physical Exam  Constitutional: She is oriented to person, place, and time. She appears well-developed and well-nourished. No distress.  HENT:  Head: Normocephalic and atraumatic.  Nose: Nose normal.  Mouth/Throat: Oropharynx is clear and moist.  Eyes: Conjunctivae are normal. Pupils are equal, round, and reactive to light.  Neck: Normal range of motion. Neck supple.  Cardiovascular: Regular rhythm and intact distal pulses.    Pulmonary/Chest: Effort normal. No respiratory distress.  Abdominal: She exhibits no distension. There is no guarding.  Musculoskeletal:  Patient is very slow to rise from a seated position.  She has tender points across the upper back and arms and PSIS and greater trochanters.  She also has some focal trigger points that are palpable.  She has pain with extension of the lumbar spine and really pain with any movement.  She effectively does have some positive W[place] signs.  She has no pain with hip rotation internal or external.  She has good distal strength without clonus.  Neurological: She is alert and oriented to person, place, and time. She exhibits normal muscle tone. Coordination normal.  Skin: Skin is warm. No rash noted. No erythema.  Psychiatric:  Her behavior is normal.  Affect is flat but with significant anxiety and anxiousness.  Perseverates on the amount of pain that she is having overall.  Nursing note and vitals reviewed.   Ortho Exam Imaging: No results found.  Past Medical/Family/Surgical/Social History: Medications & Allergies reviewed per EMR Patient Active Problem List   Diagnosis Date Noted  . MGUS (monoclonal gammopathy of unknown significance) 05/15/2016  . HLD (hyperlipidemia) 04/05/2015  . Cerebrovascular accident (CVA) due to occlusion of right cerebellar artery ([place])   . Stroke ([place]) 04/04/2015  . Cerebellar stroke, acute ([place]) 04/04/2015  . Hypertension 04/04/2015  . Stroke (cerebrum) ([place]) 04/04/2015  . Type 2 diabetes mellitus with vascular disease ([place]) 04/04/2015  . Anemia due to GI blood loss   . Symptomatic anemia   . Acute posthemorrhagic anemia 12/09/2013  . Upper GI bleed 12/09/2013  . Back pain 12/09/2013  . Chronic diarrhea 12/09/2013   Past Medical History:  Diagnosis Date  . Back pain   . Breast cancer, left breast ([place]) 2010   lumpectomy  . Chronic diarrhea   . Diabetes mellitus type 2, uncontrolled ([place])   . Essential hypertension    . Hiatal hernia    Stenosis of the spine  . Spinal stenosis   . Stroke [facility]) 03/2015   Family History  Problem Relation Age of Onset  . Hypertension Mother   . Dementia Father   . Cancer Brother        pancreas   Past Surgical History:  Procedure Laterality Date  . BREAST SURGERY    . CHOLECYSTECTOMY    . ESOPHAGOGASTRODUODENOSCOPY (EGD) WITH PROPOFOL N/A 12/10/2013   Procedure: ESOPHAGOGASTRODUODENOSCOPY (EGD) WITH PROPOFOL;  Surgeon: Jeryl Columbia, MD;  Location: [facility] ENDOSCOPY;  Service: Endoscopy;  Laterality: N/A;  . HEMORRHOID SURGERY    . PARTIAL HYSTERECTOMY     Social History   Occupational History  . Occupation: Retired    Comment: [employer]  Tobacco Use  . Smoking status: Never Smoker  . Smokeless tobacco: Never Used  Substance and Sexual Activity  . Alcohol use: No  . Drug use: No  . Sexual activity: Not on file

## 2016-12-27 NOTE — Procedures (Signed)
Lumbosacral Transforaminal Epidural Steroid Injection - Sub-Pedicular Approach with Fluoroscopic Guidance  Patient: Kendra Maldonado      Date of Birth: September 05, 1933 MRN: [id number] PCP: Lavone Orn, MD      Visit Date: 12/23/2016   Universal Protocol:    Date/Time: 12/23/2016  Consent Given By: the patient  Position: PRONE  Additional Comments: Vital signs were monitored before and after the procedure. Patient was prepped and draped in the usual sterile fashion. The correct patient, procedure, and site was verified.   Injection Procedure Details:  Procedure Site One Meds Administered:  Meds ordered this encounter  Medications  . lidocaine (PF) (XYLOCAINE) 1 % injection 2 mL  . betamethasone acetate-betamethasone sodium phosphate (CELESTONE) injection 12 mg    Laterality: Bilateral  Location/Site:  L4-L5  Needle size: 22 G  Needle type: Spinal  Needle Placement: Transforaminal  Findings:    -Comments: Excellent flow of contrast along the nerve and into the epidural space.  Procedure Details: After squaring off the end-plates to get a true AP view, the C-arm was positioned so that an oblique view of the foramen as noted above was visualized. The target area is just inferior to the "nose of the scotty dog" or sub pedicular. The soft tissues overlying this structure were infiltrated with 2-3 ml. of 1% Lidocaine without Epinephrine.  The spinal needle was inserted toward the target using a "trajectory" view along the fluoroscope beam.  Under AP and lateral visualization, the needle was advanced so it did not puncture dura and was located close the 6 O'Clock position of the pedical in AP tracterory. Biplanar projections were used to confirm position. Aspiration was confirmed to be negative for CSF and/or blood. A 1-2 ml. volume of Isovue-250 was injected and flow of contrast was noted at each level. Radiographs were obtained for documentation purposes.   After attaining the  desired flow of contrast documented above, a 0.5 to 1.0 ml test dose of 0.25% Marcaine was injected into each respective transforaminal space.  The patient was observed for 90 seconds post injection.  After no sensory deficits were reported, and normal lower extremity motor function was noted,   the above injectate was administered so that equal amounts of the injectate were placed at each foramen (level) into the transforaminal epidural space.   Additional Comments:  The patient tolerated the procedure well Dressing: Band-Aid    Post-procedure details: Patient was observed during the procedure. Post-procedure instructions were reviewed.  Patient left the clinic in stable condition.   Pertinent Imaging: MRI of pelvis IMPRESSION: 1. Paget's disease involving the left hemipelvis, unchanged since CT scan 2010. 2. Degenerative changes involving the left hip with mild protrusio. 3. No acute bony findings.   Electronically Signed By: Marijo Sanes M.D. On: 02/23/2016 20:21  Lumbar spine MRI dated 01/09/2013 shows multilevel lumbar spondylosis with moderate facet hypertrophy at L4-5 moderate to severe central stenosis.

## 2016-12-31 ENCOUNTER — Emergency Department (HOSPITAL_COMMUNITY): Payer: Medicare Other

## 2016-12-31 ENCOUNTER — Inpatient Hospital Stay (HOSPITAL_COMMUNITY): Payer: Medicare Other

## 2016-12-31 ENCOUNTER — Encounter (HOSPITAL_COMMUNITY): Payer: Self-pay

## 2016-12-31 ENCOUNTER — Inpatient Hospital Stay (HOSPITAL_COMMUNITY)
Admission: EM | Admit: 2016-12-31 | Discharge: 2017-01-09 | DRG: 310 | Disposition: A | Discharge status: Skilled Nursing Facility | Payer: Medicare Other | Attending: Internal Medicine | Admitting: Internal Medicine

## 2016-12-31 ENCOUNTER — Other Ambulatory Visit: Payer: Self-pay

## 2016-12-31 DIAGNOSIS — I272 Pulmonary hypertension, unspecified: Secondary | ICD-10-CM | POA: Diagnosis present

## 2016-12-31 DIAGNOSIS — Z7902 Long term (current) use of antithrombotics/antiplatelets: Secondary | ICD-10-CM

## 2016-12-31 DIAGNOSIS — E876 Hypokalemia: Secondary | ICD-10-CM | POA: Diagnosis not present

## 2016-12-31 DIAGNOSIS — Z79899 Other long term (current) drug therapy: Secondary | ICD-10-CM

## 2016-12-31 DIAGNOSIS — K529 Noninfective gastroenteritis and colitis, unspecified: Secondary | ICD-10-CM | POA: Diagnosis present

## 2016-12-31 DIAGNOSIS — R531 Weakness: Secondary | ICD-10-CM

## 2016-12-31 DIAGNOSIS — I10 Essential (primary) hypertension: Secondary | ICD-10-CM | POA: Diagnosis not present

## 2016-12-31 DIAGNOSIS — I63541 Cerebral infarction due to unspecified occlusion or stenosis of right cerebellar artery: Secondary | ICD-10-CM | POA: Diagnosis not present

## 2016-12-31 DIAGNOSIS — K219 Gastro-esophageal reflux disease without esophagitis: Secondary | ICD-10-CM | POA: Diagnosis present

## 2016-12-31 DIAGNOSIS — Z66 Do not resuscitate: Secondary | ICD-10-CM | POA: Diagnosis present

## 2016-12-31 DIAGNOSIS — R1310 Dysphagia, unspecified: Secondary | ICD-10-CM | POA: Diagnosis present

## 2016-12-31 DIAGNOSIS — I129 Hypertensive chronic kidney disease with stage 1 through stage 4 chronic kidney disease, or unspecified chronic kidney disease: Secondary | ICD-10-CM | POA: Diagnosis present

## 2016-12-31 DIAGNOSIS — D509 Iron deficiency anemia, unspecified: Secondary | ICD-10-CM | POA: Diagnosis present

## 2016-12-31 DIAGNOSIS — Z7984 Long term (current) use of oral hypoglycemic drugs: Secondary | ICD-10-CM | POA: Diagnosis not present

## 2016-12-31 DIAGNOSIS — K449 Diaphragmatic hernia without obstruction or gangrene: Secondary | ICD-10-CM | POA: Diagnosis present

## 2016-12-31 DIAGNOSIS — E1159 Type 2 diabetes mellitus with other circulatory complications: Secondary | ICD-10-CM | POA: Diagnosis present

## 2016-12-31 DIAGNOSIS — Z7982 Long term (current) use of aspirin: Secondary | ICD-10-CM

## 2016-12-31 DIAGNOSIS — Z8673 Personal history of transient ischemic attack (TIA), and cerebral infarction without residual deficits: Secondary | ICD-10-CM

## 2016-12-31 DIAGNOSIS — Z853 Personal history of malignant neoplasm of breast: Secondary | ICD-10-CM

## 2016-12-31 DIAGNOSIS — D472 Monoclonal gammopathy: Secondary | ICD-10-CM | POA: Diagnosis present

## 2016-12-31 DIAGNOSIS — I4891 Unspecified atrial fibrillation: Principal | ICD-10-CM | POA: Diagnosis present

## 2016-12-31 DIAGNOSIS — Z9049 Acquired absence of other specified parts of digestive tract: Secondary | ICD-10-CM | POA: Diagnosis not present

## 2016-12-31 DIAGNOSIS — K228 Other specified diseases of esophagus: Secondary | ICD-10-CM | POA: Diagnosis present

## 2016-12-31 DIAGNOSIS — R197 Diarrhea, unspecified: Secondary | ICD-10-CM | POA: Diagnosis present

## 2016-12-31 DIAGNOSIS — E1122 Type 2 diabetes mellitus with diabetic chronic kidney disease: Secondary | ICD-10-CM | POA: Diagnosis present

## 2016-12-31 DIAGNOSIS — M7989 Other specified soft tissue disorders: Secondary | ICD-10-CM | POA: Diagnosis not present

## 2016-12-31 DIAGNOSIS — M4800 Spinal stenosis, site unspecified: Secondary | ICD-10-CM | POA: Diagnosis present

## 2016-12-31 DIAGNOSIS — R911 Solitary pulmonary nodule: Secondary | ICD-10-CM | POA: Diagnosis present

## 2016-12-31 DIAGNOSIS — M8888 Osteitis deformans of other bones: Secondary | ICD-10-CM | POA: Diagnosis present

## 2016-12-31 DIAGNOSIS — E785 Hyperlipidemia, unspecified: Secondary | ICD-10-CM | POA: Diagnosis present

## 2016-12-31 DIAGNOSIS — Z7901 Long term (current) use of anticoagulants: Secondary | ICD-10-CM | POA: Diagnosis not present

## 2016-12-31 DIAGNOSIS — N182 Chronic kidney disease, stage 2 (mild): Secondary | ICD-10-CM | POA: Diagnosis present

## 2016-12-31 DIAGNOSIS — I639 Cerebral infarction, unspecified: Secondary | ICD-10-CM | POA: Diagnosis present

## 2016-12-31 HISTORY — DX: Monoclonal gammopathy: D47.2

## 2016-12-31 HISTORY — DX: Anemia, unspecified: D64.9

## 2016-12-31 HISTORY — DX: Solitary pulmonary nodule: R91.1

## 2016-12-31 HISTORY — DX: Gastrointestinal hemorrhage, unspecified: K92.2

## 2016-12-31 HISTORY — DX: Iron deficiency anemia, unspecified: D50.9

## 2016-12-31 HISTORY — DX: Cerebrovascular disease, unspecified: I67.9

## 2016-12-31 HISTORY — DX: Osteitis deformans of unspecified bone: M88.9

## 2016-12-31 LAB — CBC WITH DIFFERENTIAL/PLATELET
BASOS PCT: 0 %
Basophils Absolute: 0 10*3/uL (ref 0.0–0.1)
EOS ABS: 0.1 10*3/uL (ref 0.0–0.7)
Eosinophils Relative: 2 %
HCT: 35.6 % — ABNORMAL LOW (ref 36.0–46.0)
HEMOGLOBIN: 10.7 g/dL — AB (ref 12.0–15.0)
Lymphocytes Relative: 12 %
Lymphs Abs: 0.8 10*3/uL (ref 0.7–4.0)
MCH: 31.2 pg (ref 26.0–34.0)
MCHC: 30.1 g/dL (ref 30.0–36.0)
MCV: 103.8 fL — ABNORMAL HIGH (ref 78.0–100.0)
Monocytes Absolute: 0.7 10*3/uL (ref 0.1–1.0)
Monocytes Relative: 10 %
NEUTROS ABS: 5.2 10*3/uL (ref 1.7–7.7)
NEUTROS PCT: 76 %
Platelets: 179 10*3/uL (ref 150–400)
RBC: 3.43 MIL/uL — AB (ref 3.87–5.11)
RDW: 13.8 % (ref 11.5–15.5)
WBC: 6.8 10*3/uL (ref 4.0–10.5)

## 2016-12-31 LAB — URINALYSIS, ROUTINE W REFLEX MICROSCOPIC
Bilirubin Urine: NEGATIVE
Glucose, UA: NEGATIVE mg/dL
Hgb urine dipstick: NEGATIVE
Ketones, ur: 5 mg/dL — AB
LEUKOCYTES UA: NEGATIVE
NITRITE: NEGATIVE
Protein, ur: NEGATIVE mg/dL
SPECIFIC GRAVITY, URINE: 1.015 (ref 1.005–1.030)
pH: 5 (ref 5.0–8.0)

## 2016-12-31 LAB — COMPREHENSIVE METABOLIC PANEL
ALBUMIN: 3.5 g/dL (ref 3.5–5.0)
ALT: 54 U/L (ref 14–54)
ANION GAP: 10 (ref 5–15)
AST: 23 U/L (ref 15–41)
Alkaline Phosphatase: 152 U/L — ABNORMAL HIGH (ref 38–126)
BUN: 21 mg/dL — ABNORMAL HIGH (ref 6–20)
CHLORIDE: 111 mmol/L (ref 101–111)
CO2: 23 mmol/L (ref 22–32)
Calcium: 9 mg/dL (ref 8.9–10.3)
Creatinine, Ser: 1.03 mg/dL — ABNORMAL HIGH (ref 0.44–1.00)
GFR calc non Af Amer: 50 mL/min — ABNORMAL LOW (ref >60)
GFR, EST AFRICAN AMERICAN: 57 mL/min — AB (ref >60)
Glucose, Bld: 135 mg/dL — ABNORMAL HIGH (ref 65–99)
Potassium: 3.5 mmol/L (ref 3.5–5.1)
SODIUM: 144 mmol/L (ref 135–145)
Total Bilirubin: 0.9 mg/dL (ref 0.3–1.2)
Total Protein: 6.6 g/dL (ref 6.5–8.1)

## 2016-12-31 LAB — GLUCOSE, CAPILLARY
Glucose-Capillary: 137 mg/dL — ABNORMAL HIGH (ref 65–99)
Glucose-Capillary: 124 mg/dL — ABNORMAL HIGH (ref 65–99)

## 2016-12-31 LAB — I-STAT CG4 LACTIC ACID, ED: LACTIC ACID, VENOUS: 1.02 mmol/L (ref 0.5–1.9)

## 2016-12-31 LAB — I-STAT TROPONIN, ED: TROPONIN I, POC: 0 ng/mL (ref 0.00–0.08)

## 2016-12-31 LAB — TSH: TSH: 1.543 u[IU]/mL (ref 0.350–4.500)

## 2016-12-31 LAB — LIPASE, BLOOD: Lipase: 21 U/L (ref 11–51)

## 2016-12-31 LAB — ECHOCARDIOGRAM COMPLETE

## 2016-12-31 LAB — MAGNESIUM: MAGNESIUM: 1.8 mg/dL (ref 1.7–2.4)

## 2016-12-31 MED ORDER — INSULIN ASPART 100 UNIT/ML ~~LOC~~ SOLN
0.0000 [IU] | Freq: Every day | SUBCUTANEOUS | Status: DC
Start: 2016-12-31 — End: 2017-01-09

## 2016-12-31 MED ORDER — ENOXAPARIN SODIUM 80 MG/0.8ML ~~LOC~~ SOLN
70.0000 mg | Freq: Two times a day (BID) | SUBCUTANEOUS | Status: DC
  Administered 2017-01-01 – 2017-01-03 (×5): 70 mg via SUBCUTANEOUS
  Filled 2016-12-31 (×5): qty 0.8

## 2016-12-31 MED ORDER — METOPROLOL TARTRATE 5 MG/5ML IV SOLN
5.0000 mg | INTRAVENOUS | Status: DC | PRN
  Administered 2016-12-31 (×2): 5 mg via INTRAVENOUS
  Filled 2016-12-31 (×2): qty 5

## 2016-12-31 MED ORDER — ORAL CARE MOUTH RINSE
15.0000 mL | Freq: Two times a day (BID) | OROMUCOSAL | Status: DC
  Administered 2017-01-01 – 2017-01-06 (×8): 15 mL via OROMUCOSAL

## 2016-12-31 MED ORDER — SODIUM CHLORIDE 0.9 % IV SOLN
INTRAVENOUS | Status: DC
  Administered 2016-12-31 – 2017-01-01 (×3): via INTRAVENOUS

## 2016-12-31 MED ORDER — METOPROLOL TARTRATE 25 MG PO TABS
25.0000 mg | ORAL_TABLET | Freq: Two times a day (BID) | ORAL | Status: DC
  Administered 2016-12-31 – 2017-01-01 (×3): 25 mg via ORAL
  Filled 2016-12-31 (×3): qty 1

## 2016-12-31 MED ORDER — LOSARTAN POTASSIUM 50 MG PO TABS
100.0000 mg | ORAL_TABLET | Freq: Every day | ORAL | Status: DC
  Administered 2016-12-31 – 2017-01-01 (×2): 100 mg via ORAL
  Filled 2016-12-31 (×2): qty 2

## 2016-12-31 MED ORDER — SODIUM CHLORIDE 0.9 % IV SOLN
Freq: Once | INTRAVENOUS | Status: AC
  Administered 2016-12-31: 14:00:00 via INTRAVENOUS

## 2016-12-31 MED ORDER — INSULIN ASPART 100 UNIT/ML ~~LOC~~ SOLN
0.0000 [IU] | Freq: Three times a day (TID) | SUBCUTANEOUS | Status: DC
  Administered 2016-12-31: 2 [IU] via SUBCUTANEOUS
  Administered 2017-01-01: 3 [IU] via SUBCUTANEOUS
  Administered 2017-01-01: 2 [IU] via SUBCUTANEOUS
  Administered 2017-01-02: 5 [IU] via SUBCUTANEOUS
  Administered 2017-01-02: 2 [IU] via SUBCUTANEOUS
  Administered 2017-01-03: 3 [IU] via SUBCUTANEOUS
  Administered 2017-01-04: 2 [IU] via SUBCUTANEOUS
  Administered 2017-01-05: 3 [IU] via SUBCUTANEOUS
  Administered 2017-01-06 (×2): 2 [IU] via SUBCUTANEOUS
  Administered 2017-01-07 – 2017-01-08 (×2): 3 [IU] via SUBCUTANEOUS
  Administered 2017-01-09: 2 [IU] via SUBCUTANEOUS

## 2016-12-31 MED ORDER — DILTIAZEM HCL 100 MG IV SOLR
5.0000 mg/h | INTRAVENOUS | Status: DC
  Administered 2016-12-31 – 2017-01-02 (×4): 5 mg/h via INTRAVENOUS
  Filled 2016-12-31 (×5): qty 100

## 2016-12-31 MED ORDER — CHOLESTYRAMINE 4 G PO PACK
4.0000 g | PACK | Freq: Three times a day (TID) | ORAL | Status: DC
  Administered 2017-01-01 – 2017-01-08 (×9): 4 g via ORAL
  Filled 2016-12-31 (×30): qty 1

## 2016-12-31 MED ORDER — LABETALOL HCL 5 MG/ML IV SOLN
5.0000 mg | INTRAVENOUS | Status: DC | PRN
  Administered 2016-12-31 – 2017-01-02 (×2): 5 mg via INTRAVENOUS
  Filled 2016-12-31 (×2): qty 4

## 2016-12-31 MED ORDER — ENOXAPARIN SODIUM 80 MG/0.8ML ~~LOC~~ SOLN
70.0000 mg | Freq: Once | SUBCUTANEOUS | Status: AC
  Administered 2016-12-31: 70 mg via SUBCUTANEOUS
  Filled 2016-12-31: qty 0.7

## 2016-12-31 MED ORDER — DILTIAZEM LOAD VIA INFUSION
15.0000 mg | Freq: Once | INTRAVENOUS | Status: AC
  Administered 2016-12-31: 15 mg via INTRAVENOUS
  Filled 2016-12-31: qty 15

## 2016-12-31 MED ORDER — CHLORHEXIDINE GLUCONATE 0.12 % MT SOLN
15.0000 mL | Freq: Two times a day (BID) | OROMUCOSAL | Status: DC
  Administered 2016-12-31 – 2017-01-09 (×15): 15 mL via OROMUCOSAL
  Filled 2016-12-31 (×14): qty 15

## 2016-12-31 NOTE — ED Notes (Signed)
Ultrasound at bedside, will hang fluids when [place] complete

## 2016-12-31 NOTE — ED Notes (Signed)
Assigned [id number] @ 13:44 call report @ 14:04

## 2016-12-31 NOTE — ED Provider Notes (Signed)
[facility] DEPT Provider Note   CSN: [id number] Arrival date & time: 12/31/16  0908     History   Chief Complaint Chief Complaint  Patient presents with  . Abdominal Pain  . Diarrhea  . Medical Clearance    HPI Kendra Maldonado is a 83 y.o. female.  HPI  Kendra Maldonado is a 83 y.o. female with history of hypertension, diabetes, CVA, chronic diarrhea, chronic back pain, presents to emergency department with complaint of generalized weakness.  Patient states that she has been feeling generally weak for the last several days.  She states she has been dealing with back pain and received a an epidural steroid injection 1 week ago.  Patient states her back is feeling better.  She is also complaining of chronic diarrhea that has been worse in the last few days with intermittent abdominal pain.  She denies any nausea or vomiting.  She states she is afraid of eating and drinking because it makes her have diarrhea.  She states this morning she could not even get out of the bed due to weakness.  She states that she is having trouble doing regular daily activities, and even turning handles on the door, opening jars, taking care of herself.  Patient also reports generalized pain all over her body, states she is hurting in her back, left leg.  She is currently being treated with Tylenol which she states helps.  She reports worsening depression, states she has no desire she states that the days go by very slowly.  She states that he states she wishes that the day would be over, and currently going through the cycle daily.  She states "I am just ready to be done with this life."  She denies however thinking about harming herself.  She states that she is just tired.  Her son is worried about the patient's worsening depression and about her trying to take care of herself at home.  The family has been coming over to her house to help her out.  Past Medical History:  Diagnosis  Date  . Back pain   . Breast cancer, left breast ([place]) 2010   lumpectomy  . Chronic diarrhea   . Diabetes mellitus type 2, uncontrolled ([place])   . Essential hypertension   . Hiatal hernia    Stenosis of the spine  . Spinal stenosis   . Stroke [facility]) 03/2015    Patient Active Problem List   Diagnosis Date Noted  . MGUS (monoclonal gammopathy of unknown significance) 05/15/2016  . HLD (hyperlipidemia) 04/05/2015  . Cerebrovascular accident (CVA) due to occlusion of right cerebellar artery ([place])   . Stroke ([place]) 04/04/2015  . Cerebellar stroke, acute ([place]) 04/04/2015  . Hypertension 04/04/2015  . Stroke (cerebrum) ([place]) 04/04/2015  . Type 2 diabetes mellitus with vascular disease ([place]) 04/04/2015  . Anemia due to GI blood loss   . Symptomatic anemia   . Acute posthemorrhagic anemia 12/09/2013  . Upper GI bleed 12/09/2013  . Back pain 12/09/2013  . Chronic diarrhea 12/09/2013    Past Surgical History:  Procedure Laterality Date  . BREAST SURGERY    . CHOLECYSTECTOMY    . ESOPHAGOGASTRODUODENOSCOPY (EGD) WITH PROPOFOL N/A 12/10/2013   Procedure: ESOPHAGOGASTRODUODENOSCOPY (EGD) WITH PROPOFOL;  Surgeon: Jeryl Columbia, MD;  Location: [facility] ENDOSCOPY;  Service: Endoscopy;  Laterality: N/A;  . HEMORRHOID SURGERY    . PARTIAL HYSTERECTOMY      OB History    No data available  Home Medications    Prior to Admission medications   Medication Sig Start Date End Date Taking? Authorizing Provider  acetaminophen (TYLENOL) 500 MG tablet Take 1,000 mg by mouth every 6 (six) hours as needed for mild pain.    [provider]  amLODipine (NORVASC) 5 MG tablet Take 5 mg by mouth daily. 03/25/15   [provider]  aspirin EC 81 MG EC tablet Take 1 tablet (81 mg total) by mouth daily. 04/06/15   Hosie Poisson, MD  atorvastatin (LIPITOR) 40 MG tablet Take 1 tablet (40 mg total) by mouth daily at 6 PM. 04/06/15   Hosie Poisson, MD  cephALEXin (KEFLEX) 500 MG capsule Take 1  capsule (500 mg total) by mouth 3 (three) times daily. 12/16/16   Virgel Manifold, MD  Cholecalciferol (D 2000) 2000 UNITS TABS Take 2,000 Units by mouth daily.    [provider]  clopidogrel (PLAVIX) 75 MG tablet Take 1 tablet (75 mg total) by mouth daily. Patient not taking: Reported on 12/23/2016 04/06/15   Hosie Poisson, MD  diphenoxylate-atropine (LOMOTIL) 2.5-0.025 MG per tablet Take 2 tablets by mouth daily as needed for diarrhea or loose stools.  09/20/13   [provider]  ferrous sulfate 325 (65 FE) MG tablet Take 1 tablet (325 mg total) by mouth daily with breakfast. Patient taking differently: Take 325 mg by mouth 2 (two) times daily with a meal.  12/11/13   Sid Falcon, MD  gabapentin (NEURONTIN) 300 MG capsule Take 300 mg by mouth.    [provider]  losartan (COZAAR) 100 MG tablet Take 100 mg by mouth.    [provider]  metFORMIN (GLUCOPHAGE) 500 MG tablet Take 500 mg by mouth 2 (two) times daily. 10/07/13   [provider]  metoprolol tartrate (LOPRESSOR) 25 MG tablet Take 0.5 tablets (12.5 mg total) by mouth 2 (two) times daily. 04/06/15   Hosie Poisson, MD  omeprazole (PRILOSEC) 20 MG capsule Take 20 mg by mouth daily.    [provider]  traMADol (ULTRAM) 50 MG tablet Take 1 tablet (50 mg total) by mouth every 6 (six) hours as needed. Patient not taking: Reported on 12/23/2016 12/16/16   Virgel Manifold, MD    Family History Family History  Problem Relation Age of Onset  . Hypertension Mother   . Dementia Father   . Cancer Brother        pancreas    Social History Social History   Tobacco Use  . Smoking status: Never Smoker  . Smokeless tobacco: Never Used  Substance Use Topics  . Alcohol use: No  . Drug use: No     Allergies   Codeine and Demerol [meperidine]   Review of Systems Review of Systems  Constitutional: Positive for fatigue. Negative for chills and fever.  Respiratory: Negative for cough,  chest tightness and shortness of breath.   Cardiovascular: Negative for chest pain, palpitations and leg swelling.  Gastrointestinal: Positive for abdominal pain and diarrhea. Negative for nausea and vomiting.  Genitourinary: Negative for dysuria, flank pain and pelvic pain.  Musculoskeletal: Positive for arthralgias, back pain, myalgias and neck pain. Negative for neck stiffness.  Skin: Negative for rash.  Neurological: Positive for weakness. Negative for dizziness and headaches.  All other systems reviewed and are negative.    Physical Exam Updated Vital Signs BP (!) 157/98 (BP Location: Left Arm)   Pulse (!) 142   Temp 99.1 F (37.3 C) (Axillary)   Resp 19   SpO2  99%   Physical Exam  Constitutional: She appears well-developed and well-nourished. No distress.  HENT:  Head: Normocephalic.  Eyes: Conjunctivae are normal.  Neck: Neck supple.  Cardiovascular: Normal heart sounds.  Tachycardic, irrregular  Pulmonary/Chest: Effort normal and breath sounds normal. No respiratory distress. She has no wheezes. She has no rales.  Abdominal: Soft. Bowel sounds are normal. She exhibits no distension. There is no tenderness. There is no rebound and no guarding.  Musculoskeletal: She exhibits no edema.  Neurological: She is alert.  Skin: Skin is warm and dry.  Psychiatric: She has a normal mood and affect. Her behavior is normal.  Nursing note and vitals reviewed.    ED Treatments / Results  Labs (all labs ordered are listed, but only abnormal results are displayed) Labs Reviewed  CBC WITH DIFFERENTIAL/PLATELET - Abnormal; Notable for the following components:      Result Value   RBC 3.43 (*)    Hemoglobin 10.7 (*)    HCT 35.6 (*)    MCV 103.8 (*)    All other components within normal limits  COMPREHENSIVE METABOLIC PANEL - Abnormal; Notable for the following components:   Glucose, Bld 135 (*)    BUN 21 (*)    Creatinine, Ser 1.03 (*)    Alkaline Phosphatase 152 (*)    GFR  calc non Af Amer 50 (*)    GFR calc Af Amer 57 (*)    All other components within normal limits  URINALYSIS, ROUTINE W REFLEX MICROSCOPIC - Abnormal; Notable for the following components:   Ketones, ur 5 (*)    All other components within normal limits  CBC - Abnormal; Notable for the following components:   RBC 3.21 (*)    Hemoglobin 9.9 (*)    HCT 33.3 (*)    MCV 103.7 (*)    MCHC 29.7 (*)    All other components within normal limits  GLUCOSE, CAPILLARY - Abnormal; Notable for the following components:   Glucose-Capillary 137 (*)    All other components within normal limits  GLUCOSE, CAPILLARY - Abnormal; Notable for the following components:   Glucose-Capillary 124 (*)    All other components within normal limits  C DIFFICILE QUICK SCREEN W PCR REFLEX  GASTROINTESTINAL PANEL BY PCR, STOOL (REPLACES STOOL CULTURE)  GASTROINTESTINAL PANEL BY PCR, STOOL (REPLACES STOOL CULTURE)  LIPASE, BLOOD  MAGNESIUM  TSH  COMPREHENSIVE METABOLIC PANEL  I-STAT CG4 LACTIC ACID, ED  I-STAT TROPONIN, ED    EKG  EKG Interpretation  Date/Time:  Tuesday December 31 2016 10:19:47 EST Ventricular Rate:  129 PR Interval:    QRS Duration: 98 QT Interval:  319 QTC Calculation: 468 R Axis:   72 Text Interpretation:  Atrial fibrillation Low voltage, extremity and precordial leads atrial fibrillation is new since last tracing Confirmed by Dorie Rank [phone]) on 12/31/2016 10:45:08 AM       Radiology Dg Abd Acute W/chest  Result Date: 12/31/2016 CLINICAL DATA:  Weakness and abdominal pain EXAM: DG ABDOMEN ACUTE W/ 1V CHEST COMPARISON:  Chest x-ray from 12/09/2013, pelvis MRI from 02/23/2016. FINDINGS: Cardiac shadow is mildly enlarged. Postsurgical changes are noted in the left breast. The lungs are well aerated without focal infiltrate or sizable effusion. No free air is noted. Scattered large and small bowel gas is seen. No obstructive changes are noted. Bony changes are noted in the left  hemipelvis consistent with Paget's disease stable from the prior exams. No acute soft tissue abnormality is noted. IMPRESSION: Chronic changes without  acute abnormality. Electronically Signed   By: Inez Catalina M.D.   On: 12/31/2016 12:33    Procedures Procedures (including critical care time)  Medications Ordered in ED Medications - No data to display   Initial Impression / Assessment and Plan / ED Course  I have reviewed the triage vital signs and the nursing notes.  Pertinent labs & imaging results that were available during my care of the patient were reviewed by me and considered in my medical decision making (see chart for details).     Pt with generalized weakness, depression, worsening chronic diarrhea. Noted to have HR of 150s. ECG obtained, showed afib with rvr. No hx of the same. Will start IV fluids, check labs and electrolytes, will give metoprolol.   HR improving, now between 90s and low 100s. BP remaining normal. Electrolytes unremarkable. Weakness could be a combination of deconditioning, afib with rvr, depression. Discussed results with pt and her family. Will admit for further treatment.   Spoke with [facility]. Will admit.   Vitals:   12/31/16 2134 12/31/16 2142 01/01/17 0423 01/01/17 0527  BP:  (!) 127/92  134/87  Pulse:  84 (!) 105 93  Resp:  (!) 24  20  Temp:  98 F (36.7 C)  98 F (36.7 C)  TempSrc: Oral Oral  Oral  SpO2:  98%  93%  Weight:      Height:         Final Clinical Impressions(s) / ED Diagnoses   Final diagnoses:  Atrial fibrillation with RVR (HCC)  Diarrhea, unspecified type  Weakness    ED Discharge Orders    None       Jeannett Senior, PA-C 01/01/17 1941    Dorie Rank, MD 01/01/17 [phone]

## 2016-12-31 NOTE — ED Triage Notes (Signed)
Per EMS, pt is coming from home where she lives alone. Family is stating that pt is unable to take care of herself. Pt is not taking medications and not bathing. Family states that pt is having "mental breakdowns". Pt is AO x4 and ambulates with a walker. Pt family seeking placement.

## 2016-12-31 NOTE — Progress Notes (Signed)
  Echocardiogram 2D Echocardiogram has been performed.  Kendra Maldonado M 12/31/2016, 2:07 PM

## 2016-12-31 NOTE — H&P (Signed)
History and Physical    Kendra Maldonado JME:[id number] DOB: [birth year] DOA: 12/31/2016  PCP: Lavone Orn, MD  Patient coming from: Home  Chief Complaint: Weakness  HPI: Kendra Maldonado is a 83 y.o. female with medical history significant of prior CVA, diabetes, hypertension, history of hiatal hernia, chronic diarrhea who presents to the hospital brought in by family members with concerns for increased weakness and concerns of the patient may not be able to care for herself as she lives alone.  Reportedly has been afraid of eating food given concerns for resultant diarrhea.  Patient also complains of ongoing chronic pains, mainly her back for which she did receive an epidural steroid injection approximately 1 week prior to this hospital admission.  Ordered to the emergency department that she has been recently feeling depressed.  On further questioning, patient reports ongoing diarrhea since having cholecystectomy performed.  Patient denies fevers chills or sweats.  Patient denies abdominal pains.  Denies nausea.  ED Course: In the emergency department, patient was noted to have atrial fibrillation, new finding.  Patient was given 2 doses of IV Lopressor with improvement of heart rate to the 90s.  C. difficile was requested, pending at the time of this dictation.  Hospitalist consulted for consideration for admission.  Review of Systems:  Review of Systems  Constitutional: Positive for malaise/fatigue. Negative for chills.  HENT: Negative for congestion, ear discharge, ear pain and nosebleeds.   Eyes: Negative for double vision, photophobia and pain.  Respiratory: Negative for hemoptysis, sputum production and shortness of breath.   Cardiovascular: Negative for palpitations, orthopnea and claudication.  Gastrointestinal: Positive for diarrhea. Negative for abdominal pain, constipation, nausea and vomiting.  Genitourinary: Negative for frequency, hematuria and urgency.  Musculoskeletal:  Negative for back pain, joint pain and neck pain.  Neurological: Positive for weakness. Negative for tingling, tremors, sensory change, seizures and loss of consciousness.  Psychiatric/Behavioral: Negative for hallucinations, memory loss and substance abuse. The patient does not have insomnia.     Past Medical History:  Diagnosis Date  . Back pain   . Breast cancer, left breast ([place]) 2010   lumpectomy  . Chronic diarrhea   . Diabetes mellitus type 2, uncontrolled ([place])   . Essential hypertension   . Hiatal hernia    Stenosis of the spine  . Spinal stenosis   . Stroke [facility]) 03/2015    Past Surgical History:  Procedure Laterality Date  . BREAST SURGERY    . CHOLECYSTECTOMY    . ESOPHAGOGASTRODUODENOSCOPY (EGD) WITH PROPOFOL N/A 12/10/2013   Procedure: ESOPHAGOGASTRODUODENOSCOPY (EGD) WITH PROPOFOL;  Surgeon: Jeryl Columbia, MD;  Location: [facility] ENDOSCOPY;  Service: Endoscopy;  Laterality: N/A;  . HEMORRHOID SURGERY    . PARTIAL HYSTERECTOMY       reports that  has never smoked. she has never used smokeless tobacco. She reports that she does not drink alcohol or use drugs.  Allergies  Allergen Reactions  . Codeine Rash  . Demerol [Meperidine] Rash    Family History  Problem Relation Age of Onset  . Hypertension Mother   . Dementia Father   . Cancer Brother        pancreas    Prior to Admission medications   Medication Sig Start Date End Date Taking? Authorizing Provider  acetaminophen (TYLENOL) 500 MG tablet Take 1,000 mg by mouth every 6 (six) hours as needed for mild pain.    [provider]  amLODipine (NORVASC) 5 MG tablet Take 5 mg by mouth daily.  03/25/15   [provider]  aspirin EC 81 MG EC tablet Take 1 tablet (81 mg total) by mouth daily. 04/06/15   Hosie Poisson, MD  atorvastatin (LIPITOR) 40 MG tablet Take 1 tablet (40 mg total) by mouth daily at 6 PM. 04/06/15   Hosie Poisson, MD  cephALEXin (KEFLEX) 500 MG capsule Take 1 capsule (500 mg total)  by mouth 3 (three) times daily. 12/16/16   Virgel Manifold, MD  Cholecalciferol (D 2000) 2000 UNITS TABS Take 2,000 Units by mouth daily.    [provider]  clopidogrel (PLAVIX) 75 MG tablet Take 1 tablet (75 mg total) by mouth daily. Patient not taking: Reported on 12/23/2016 04/06/15   Hosie Poisson, MD  diphenoxylate-atropine (LOMOTIL) 2.5-0.025 MG per tablet Take 2 tablets by mouth daily as needed for diarrhea or loose stools.  09/20/13   [provider]  ferrous sulfate 325 (65 FE) MG tablet Take 1 tablet (325 mg total) by mouth daily with breakfast. Patient taking differently: Take 325 mg by mouth 2 (two) times daily with a meal.  12/11/13   Sid Falcon, MD  gabapentin (NEURONTIN) 300 MG capsule Take 300 mg by mouth.    [provider]  losartan (COZAAR) 100 MG tablet Take 100 mg by mouth.    [provider]  metFORMIN (GLUCOPHAGE) 500 MG tablet Take 500 mg by mouth 2 (two) times daily. 10/07/13   [provider]  metoprolol tartrate (LOPRESSOR) 25 MG tablet Take 0.5 tablets (12.5 mg total) by mouth 2 (two) times daily. 04/06/15   Hosie Poisson, MD  omeprazole (PRILOSEC) 20 MG capsule Take 20 mg by mouth daily.    [provider]  traMADol (ULTRAM) 50 MG tablet Take 1 tablet (50 mg total) by mouth every 6 (six) hours as needed. Patient not taking: Reported on 12/23/2016 12/16/16   Virgel Manifold, MD    Physical Exam: Vitals:   12/31/16 1208 12/31/16 1239 12/31/16 1242 12/31/16 1254  BP:   (!) 145/94   Pulse: (!) 138 (!) 136 (!) 104 96  Resp: (!) 22 (!) 24 (!) 25 (!) 23  Temp:      TempSrc:      SpO2:  97% 94%     Constitutional: NAD, calm, comfortable Vitals:   12/31/16 1208 12/31/16 1239 12/31/16 1242 12/31/16 1254  BP:   (!) 145/94   Pulse: (!) 138 (!) 136 (!) 104 96  Resp: (!) 22 (!) 24 (!) 25 (!) 23  Temp:      TempSrc:      SpO2:  97% 94%    Eyes: PERRL, lids and conjunctivae normal ENMT: Mucous membranes are moist.  Posterior pharynx clear of any exudate or lesions.Normal dentition.  Neck: normal, supple, no masses, no thyromegaly Respiratory: clear to auscultation bilaterally, no wheezing, no crackles. Normal respiratory effort. No accessory muscle use.  Cardiovascular:, Tachycardic, irregularly irregular, S1-S2 Abdomen: no tenderness, no masses palpated. No hepatosplenomegaly. Bowel sounds positive.  Musculoskeletal: no clubbing / cyanosis. No joint deformity upper and lower extremities. Good ROM, no contractures. Normal muscle tone.  Skin: no rashes, lesions, ulcers. No induration Neurologic: CN 2-12 grossly intact. Sensation intact, DTR normal. Strength 5/5 in all 4.  Psychiatric: Normal judgment and insight. Alert and oriented x 3. Normal mood.    Labs on Admission: I have personally reviewed following labs and imaging studies  CBC: Recent Labs  Lab 12/31/16 1030  WBC 6.8  NEUTROABS 5.2  HGB 10.7*  HCT 35.6*  MCV  103.8*  PLT 010   Basic Metabolic Panel: Recent Labs  Lab 12/31/16 1030  NA 144  K 3.5  CL 111  CO2 23  GLUCOSE 135*  BUN 21*  CREATININE 1.03*  CALCIUM 9.0  MG 1.8   GFR: CrCl cannot be calculated (Unknown ideal weight.). Liver Function Tests: Recent Labs  Lab 12/31/16 1030  AST 23  ALT 54  ALKPHOS 152*  BILITOT 0.9  PROT 6.6  ALBUMIN 3.5   Recent Labs  Lab 12/31/16 1030  LIPASE 21   No results for input(s): AMMONIA in the last 168 hours. Coagulation Profile: No results for input(s): INR, PROTIME in the last 168 hours. Cardiac Enzymes: No results for input(s): CKTOTAL, CKMB, CKMBINDEX, TROPONINI in the last 168 hours. BNP (last 3 results) No results for input(s): PROBNP in the last 8760 hours. HbA1C: No results for input(s): HGBA1C in the last 72 hours. CBG: No results for input(s): GLUCAP in the last 168 hours. Lipid Profile: No results for input(s): CHOL, HDL, LDLCALC, TRIG, CHOLHDL, LDLDIRECT in the last 72 hours. Thyroid Function Tests: No  results for input(s): TSH, T4TOTAL, FREET4, T3FREE, THYROIDAB in the last 72 hours. Anemia Panel: No results for input(s): VITAMINB12, FOLATE, FERRITIN, TIBC, IRON, RETICCTPCT in the last 72 hours. Urine analysis:    Component Value Date/Time   COLORURINE STRAW (A) 12/16/2016 0944   APPEARANCEUR CLEAR 12/16/2016 0944   LABSPEC 1.011 12/16/2016 0944   PHURINE 6.0 12/16/2016 0944   GLUCOSEU NEGATIVE 12/16/2016 0944   HGBUR NEGATIVE 12/16/2016 0944   BILIRUBINUR NEGATIVE 12/16/2016 0944   KETONESUR NEGATIVE 12/16/2016 0944   PROTEINUR NEGATIVE 12/16/2016 0944   UROBILINOGEN 0.2 03/12/2007 1413   NITRITE POSITIVE (A) 12/16/2016 0944   LEUKOCYTESUR TRACE (A) 12/16/2016 0944   Sepsis Labs: !!!!!!!!!!!!!!!!!!!!!!!!!!!!!!!!!!!!!!!!!!!! @LABRCNTIP (procalcitonin:4,lacticidven:4) )No results found for this or any previous visit (from the past 240 hour(s)).   Radiological Exams on Admission: Dg Abd Acute W/chest  Result Date: 12/31/2016 CLINICAL DATA:  Weakness and abdominal pain EXAM: DG ABDOMEN ACUTE W/ 1V CHEST COMPARISON:  Chest x-ray from 12/09/2013, pelvis MRI from 02/23/2016. FINDINGS: Cardiac shadow is mildly enlarged. Postsurgical changes are noted in the left breast. The lungs are well aerated without focal infiltrate or sizable effusion. No free air is noted. Scattered large and small bowel gas is seen. No obstructive changes are noted. Bony changes are noted in the left hemipelvis consistent with Paget's disease stable from the prior exams. No acute soft tissue abnormality is noted. IMPRESSION: Chronic changes without acute abnormality. Electronically Signed   By: Inez Catalina M.D.   On: 12/31/2016 12:33    EKG: Independently reviewed. afib RVR  Assessment/Plan Principal Problem:   Diarrhea Active Problems:   Chronic diarrhea   Hypertension   Type 2 diabetes mellitus with vascular disease (HCC)   HLD (hyperlipidemia)   1. New Afib with RVR 1. EKG reviewed with findings of  new rapid afib 2. Improved with 2 doses of IV lopressor in ED 3. Will continue on scheduled beta blocker with PRN for rate control 4. TSH ordered in ED, pending 5. Last 2d echo from 2017. Will repeat 2d echo 6. CHADS-VASc of at least 7 places patient at higher risk for CVA. Will start patient on lovenox. Patient has a prior hx of UGI bleed so will monitor closely for bleeding. If patient tolerates anticoagulation, then would consider transition to NOAC 7. For now, avoid PPI as patient is being worked up for [employer], per below 2. Chronic diarrhea 1.  Unclear etiology 2. Patient does state diarrhea began shortly after having cholecystectomy 3. Cdiff pending 4. Will check stool culture 5. Continue IVF hydration 6. Will give trial of cholestyramine 3. HTN 1. BP stable  2. Cont home meds 4. DM2 1. Continue on SSI coverage 2. Appears stable at this time 5. HLD 1. Appears stable at present 6. Hx UGI bleed 1. Stable at this time 2. Monitor closely for blood loss  DVT prophylaxis: Lovenox  Code Status: DNR confirmed with patient at bedside Family Communication: Pt in room  Disposition Plan: Uncertain at this time  Consults called:  Admission status: Inpatient as would require greater than 2 midnight stay to manage rapid afib   Marylu Lund MD [facility] Pager [phone]  If 7PM-7AM, please contact night-coverage [email or web address] Password [email or web address]  12/31/2016, 1:02 PM

## 2016-12-31 NOTE — ED Notes (Signed)
ED TO INPATIENT HANDOFF REPORT  Name/Age/Gender Kendra Maldonado 83 y.o. female  Code Status    Code Status Orders  (From admission, onward)        Start     Ordered   12/31/16 1323  Do not attempt resuscitation (DNR)  Continuous    Question Answer Comment  In the event of cardiac or respiratory ARREST Do not call a "code blue"   In the event of cardiac or respiratory ARREST Do not perform Intubation, CPR, defibrillation or ACLS   In the event of cardiac or respiratory ARREST Use medication by any route, position, wound care, and other measures to relive pain and suffering. May use oxygen, suction and manual treatment of airway obstruction as needed for comfort.      12/31/16 1322    Code Status History    Date Active Date Inactive Code Status Order ID Comments User Context   12/31/2016 13:20 12/31/2016 13:22 Full Code [id number]  Kendra Hazel, MD ED   04/04/2015 06:53 04/06/2015 16:43 Full Code [id number]  Kendra Patience, MD ED   12/09/2013 20:32 12/11/2013 19:21 Full Code [id number]  Kendra Jansky, MD Inpatient    Advance Directive Documentation     Most Recent Value  Type of Advance Directive  Living will  Pre-existing out of facility DNR order (yellow form or pink MOST form)  No data  "MOST" Form in Place?  No data      Home/SNF/Other Home  Chief Complaint n-d   Level of Care/Admitting Diagnosis ED Disposition    ED Disposition Condition Comment   Admit  Hospital Area: [facility] [[id number]]  Level of Care: Telemetry [5]  Admit to tele based on following criteria: Complex arrhythmia (Bradycardia/Tachycardia)  Diagnosis: Rapid atrial fibrillation ([place]) [660630]  Admitting Physician: Velta Addison  Attending Physician: Kendra Maldonado [[id number]]  Estimated length of stay: 5 - 7 days  Certification:: I certify this patient will need inpatient services for at least 2 midnights  PT Class (Do Not Modify): Inpatient [101]  PT Acc Code  (Do Not Modify): Private [1]       Medical History Past Medical History:  Diagnosis Date  . Back pain   . Breast cancer, left breast ([place]) 2010   lumpectomy  . Chronic diarrhea   . Diabetes mellitus type 2, uncontrolled ([place])   . Essential hypertension   . Hiatal hernia    Stenosis of the spine  . Spinal stenosis   . Stroke ([place]) 03/2015    Allergies Allergies  Allergen Reactions  . Tramadol     DIZZY, "WENT OUT"  . Codeine Rash  . Demerol [Meperidine] Rash    IV Location/Drains/Wounds Patient Lines/Drains/Airways Status   Active Line/Drains/Airways    Name:   Placement date:   Placement time:   Site:   Days:   Peripheral IV 12/31/16 Left Wrist   12/31/16    1040    Wrist   less than 1          Labs/Imaging Results for orders placed or performed during the hospital encounter of 12/31/16 (from the past 48 hour(s))  CBC with Differential     Status: Abnormal   Collection Time: 12/31/16 10:30 AM  Result Value Ref Range   WBC 6.8 4.0 - 10.5 K/uL   RBC 3.43 (L) 3.87 - 5.11 MIL/uL   Hemoglobin 10.7 (L) 12.0 - 15.0 g/dL   HCT 35.6 (L) 36.0 - 46.0 %  MCV 103.8 (H) 78.0 - 100.0 fL   MCH 31.2 26.0 - 34.0 pg   MCHC 30.1 30.0 - 36.0 g/dL   RDW 13.8 11.5 - 15.5 %   Platelets 179 150 - 400 K/uL   Neutrophils Relative % 76 %   Neutro Abs 5.2 1.7 - 7.7 K/uL   Lymphocytes Relative 12 %   Lymphs Abs 0.8 0.7 - 4.0 K/uL   Monocytes Relative 10 %   Monocytes Absolute 0.7 0.1 - 1.0 K/uL   Eosinophils Relative 2 %   Eosinophils Absolute 0.1 0.0 - 0.7 K/uL   Basophils Relative 0 %   Basophils Absolute 0.0 0.0 - 0.1 K/uL  Comprehensive metabolic panel     Status: Abnormal   Collection Time: 12/31/16 10:30 AM  Result Value Ref Range   Sodium 144 135 - 145 mmol/L   Potassium 3.5 3.5 - 5.1 mmol/L   Chloride 111 101 - 111 mmol/L   CO2 23 22 - 32 mmol/L   Glucose, Bld 135 (H) 65 - 99 mg/dL   BUN 21 (H) 6 - 20 mg/dL   Creatinine, Ser 1.03 (H) 0.44 - 1.00 mg/dL   Calcium 9.0  8.9 - 10.3 mg/dL   Total Protein 6.6 6.5 - 8.1 g/dL   Albumin 3.5 3.5 - 5.0 g/dL   AST 23 15 - 41 U/L   ALT 54 14 - 54 U/L   Alkaline Phosphatase 152 (H) 38 - 126 U/L   Total Bilirubin 0.9 0.3 - 1.2 mg/dL   GFR calc non Af Amer 50 (L) >60 mL/min   GFR calc Af Amer 57 (L) >60 mL/min    Comment: (NOTE) The eGFR has been calculated using the CKD EPI equation. This calculation has not been validated in all clinical situations. eGFR's persistently <60 mL/min signify possible Chronic Kidney Disease.    Anion gap 10 5 - 15  Lipase, blood     Status: None   Collection Time: 12/31/16 10:30 AM  Result Value Ref Range   Lipase 21 11 - 51 U/L  Magnesium     Status: None   Collection Time: 12/31/16 10:30 AM  Result Value Ref Range   Magnesium 1.8 1.7 - 2.4 mg/dL  I-Stat Troponin, ED (not at [facility])     Status: None   Collection Time: 12/31/16 10:46 AM  Result Value Ref Range   Troponin i, poc 0.00 0.00 - 0.08 ng/mL   Comment 3            Comment: Due to the release kinetics of cTnI, a negative result within the first hours of the onset of symptoms does not rule out myocardial infarction with certainty. If myocardial infarction is still suspected, repeat the test at appropriate intervals.   I-Stat CG4 Lactic Acid, ED     Status: None   Collection Time: 12/31/16 10:47 AM  Result Value Ref Range   Lactic Acid, Venous 1.02 0.5 - 1.9 mmol/L  Urinalysis, Routine w reflex microscopic     Status: Abnormal   Collection Time: 12/31/16 12:15 PM  Result Value Ref Range   Color, Urine YELLOW YELLOW   APPearance CLEAR CLEAR   Specific Gravity, Urine 1.015 1.005 - 1.030   pH 5.0 5.0 - 8.0   Glucose, UA NEGATIVE NEGATIVE mg/dL   Hgb urine dipstick NEGATIVE NEGATIVE   Bilirubin Urine NEGATIVE NEGATIVE   Ketones, ur 5 (A) NEGATIVE mg/dL   Protein, ur NEGATIVE NEGATIVE mg/dL   Nitrite NEGATIVE NEGATIVE   Leukocytes, UA  NEGATIVE NEGATIVE   Dg Abd Acute W/chest  Result Date: 12/31/2016 CLINICAL  DATA:  Weakness and abdominal pain EXAM: DG ABDOMEN ACUTE W/ 1V CHEST COMPARISON:  Chest x-ray from 12/09/2013, pelvis MRI from 02/23/2016. FINDINGS: Cardiac shadow is mildly enlarged. Postsurgical changes are noted in the left breast. The lungs are well aerated without focal infiltrate or sizable effusion. No free air is noted. Scattered large and small bowel gas is seen. No obstructive changes are noted. Bony changes are noted in the left hemipelvis consistent with Paget's disease stable from the prior exams. No acute soft tissue abnormality is noted. IMPRESSION: Chronic changes without acute abnormality. Electronically Signed   By: Inez Catalina M.D.   On: 12/31/2016 12:33    Pending Labs Unresulted Labs (From admission, onward)   Start     Ordered   01/01/17 0500  Comprehensive metabolic panel  Tomorrow morning,   R     12/31/16 1320   01/01/17 0500  CBC  Tomorrow morning,   R     12/31/16 1320   12/31/16 1321  Gastrointestinal Panel by PCR , Stool  (Gastrointestinal Panel by PCR, Stool)  Once,   R     12/31/16 1320   12/31/16 1139  Gastrointestinal Panel by PCR , Stool  (Gastrointestinal Panel by PCR, Stool)  Once,   R     12/31/16 1138   12/31/16 1138  C difficile quick scan w PCR reflex  (C Difficile quick screen w PCR reflex panel)  Once, for 48 hours,   R    Comments:  Laxatives (last 72 hours)   None     Question Answer Comment  Is your patient experiencing loose or watery stools (3 or more in 24 hours)? Yes   Has the patient received laxatives in the last 24 hours? No   Has a negative Cdiff test resulted in the last 7 days? No      12/31/16 1138   12/31/16 1049  TSH  STAT,   STAT     12/31/16 1049      Vitals/Pain Today's Vitals   12/31/16 1413 12/31/16 1430 12/31/16 1500 12/31/16 1530  BP: (!) 145/107 (!) 139/97 137/89 131/80  Pulse: (!) 156 (!) 124 (!) 138   Resp: (!) 22 (!) 25 (!) 25 (!) 25  Temp:      TempSrc:      SpO2: 96% 95% 91%   Weight:      Height:       PainSc:        Isolation Precautions Enteric precautions (UV disinfection)  Medications Medications  0.9 %  sodium chloride infusion ( Intravenous New Bag/Given 12/31/16 1410)  insulin aspart (novoLOG) injection 0-15 Units (not administered)  insulin aspart (novoLOG) injection 0-5 Units (not administered)  labetalol (NORMODYNE,TRANDATE) injection 5 mg (5 mg Intravenous Given 12/31/16 1418)  cholestyramine (QUESTRAN) packet 4 g (not administered)  losartan (COZAAR) tablet 100 mg (100 mg Oral Given 12/31/16 1412)  metoprolol tartrate (LOPRESSOR) tablet 25 mg (25 mg Oral Given 12/31/16 1413)  enoxaparin (LOVENOX) injection 70 mg (not administered)  enoxaparin (LOVENOX) injection 70 mg (not administered)  0.9 %  sodium chloride infusion ( Intravenous New Bag/Given 12/31/16 1410)    Mobility walks with person assist

## 2016-12-31 NOTE — ED Notes (Signed)
Patient transported to X-ray 

## 2016-12-31 NOTE — ED Notes (Signed)
Bed: HE17 Expected date:  Expected time:  Means of arrival:  Comments: Hold for TCU 27

## 2016-12-31 NOTE — Progress Notes (Signed)
ANTICOAGULATION CONSULT NOTE - Initial Consult  Pharmacy Consult for Enoxaparin Indication: atrial fibrillation  Allergies  Allergen Reactions  . Tramadol     DIZZY, "WENT OUT"  . Codeine Rash  . Demerol [Meperidine] Rash    Patient Measurements: Height: 5' 7.5" (171.5 cm) Weight: 160 lb (72.6 kg) IBW/kg (Calculated) : 62.75 (Last documented weight 77.1 kg on 11/05/16)  Vital Signs: Temp: 99.2 F (37.3 C) (12/11 1129) Temp Source: Rectal (12/11 1129) BP: 145/94 (12/11 1242) Pulse Rate: 134 (12/11 1330)  Labs: Recent Labs    12/31/16 1030  HGB 10.7*  HCT 35.6*  PLT 179  CREATININE 1.03*    CrCl cannot be calculated (Unknown ideal weight.).   Medical History: Past Medical History:  Diagnosis Date  . Back pain   . Breast cancer, left breast ([place]) 2010   lumpectomy  . Chronic diarrhea   . Diabetes mellitus type 2, uncontrolled ([place])   . Essential hypertension   . Hiatal hernia    Stenosis of the spine  . Spinal stenosis   . Stroke ([place]) 03/2015    Medications:  Scheduled:  . cholestyramine  4 g Oral TID  . insulin aspart  0-15 Units Subcutaneous TID WC  . insulin aspart  0-5 Units Subcutaneous QHS  . losartan  100 mg Oral Daily  . metoprolol tartrate  Kendra mg Oral BID   Infusions:  . sodium chloride    . sodium chloride      Assessment: Kendra Maldonado admitted on 12/11 from home with weakness, diarrhea, found to be in afib.  Pharmacy is consulted for enoxaparin dosing. No prior to admission anticoagulation, except Aspirin 81 mg. SCr 1.03, CrCl ~ 42 ml/min CBC: Hgb 10.7, Plt 179   Goal of Therapy:  Anti-Xa level 0.6-1 units/ml 4hrs after LMWH dose given Monitor platelets by anticoagulation protocol: Yes   Plan:  Lovenox 70 mg SQ q12h  Follow up renal function, CBC  Follow up long term anticoagulation plans   Gretta Arab PharmD, BCPS Pager [phone] 12/31/2016,1:52 PM

## 2016-12-31 NOTE — ED Notes (Signed)
Bed: WA27 Expected date:  Expected time:  Means of arrival:  Comments: placement

## 2016-12-31 NOTE — ED Notes (Signed)
Magnesium will be processed as an add on

## 2016-12-31 NOTE — ED Notes (Signed)
Pt son states patient has been battling depression her entire life and it seems to be worse recently.

## 2016-12-31 NOTE — ED Provider Notes (Signed)
Patient presents to the emergency room with several concerns.  Family is concerned about her ability to live at home.  Patient however has been feeling very weak.  She has had episodes of diarrhea recently as well.  Patient denies any trouble with chest pain or shortness of breath.  She denies any history of irregular heart rhythms that were specifically atrial fibrillation.  Patient was noted to be tachycardic on her initial vital signs.  Patient's EKG shows that she is in atrial fibrillation.  This is new.  Interestingly, she had vital signs documented when she had a procedure done on December 3 of this year.  Her heart rate was 109 then.  In retrospect, a the patient most likely was in atrial fibrillation back then.   Chads2Vasc2= 7.     Labs and xrays pending.  Plan on admission for further workup, cardiac eval   EKG Interpretation  Date/Time:  Tuesday December 31 2016 10:19:47 EST Ventricular Rate:  129 PR Interval:    QRS Duration: 98 QT Interval:  319 QTC Calculation: 468 R Axis:   72 Text Interpretation:  Atrial fibrillation Low voltage, extremity and precordial leads atrial fibrillation is new since last tracing Confirmed by Dorie Rank [phone]) on 12/31/2016 10:45:08 AM         Dorie Rank, MD 12/31/16 1102

## 2017-01-01 ENCOUNTER — Encounter (HOSPITAL_COMMUNITY): Payer: Self-pay | Admitting: Physician Assistant

## 2017-01-01 DIAGNOSIS — Z7901 Long term (current) use of anticoagulants: Secondary | ICD-10-CM

## 2017-01-01 DIAGNOSIS — R197 Diarrhea, unspecified: Secondary | ICD-10-CM

## 2017-01-01 DIAGNOSIS — I10 Essential (primary) hypertension: Secondary | ICD-10-CM

## 2017-01-01 DIAGNOSIS — I4891 Unspecified atrial fibrillation: Principal | ICD-10-CM

## 2017-01-01 LAB — CBC
HEMATOCRIT: 33.3 % — AB (ref 36.0–46.0)
HEMOGLOBIN: 9.9 g/dL — AB (ref 12.0–15.0)
MCH: 30.8 pg (ref 26.0–34.0)
MCHC: 29.7 g/dL — ABNORMAL LOW (ref 30.0–36.0)
MCV: 103.7 fL — ABNORMAL HIGH (ref 78.0–100.0)
Platelets: 157 10*3/uL (ref 150–400)
RBC: 3.21 MIL/uL — AB (ref 3.87–5.11)
RDW: 13.8 % (ref 11.5–15.5)
WBC: 7.4 10*3/uL (ref 4.0–10.5)

## 2017-01-01 LAB — COMPREHENSIVE METABOLIC PANEL
ALBUMIN: 3.2 g/dL — AB (ref 3.5–5.0)
ALT: 45 U/L (ref 14–54)
ANION GAP: 8 (ref 5–15)
AST: 20 U/L (ref 15–41)
Alkaline Phosphatase: 135 U/L — ABNORMAL HIGH (ref 38–126)
BILIRUBIN TOTAL: 0.5 mg/dL (ref 0.3–1.2)
BUN: 14 mg/dL (ref 6–20)
CALCIUM: 8.6 mg/dL — AB (ref 8.9–10.3)
CO2: 20 mmol/L — ABNORMAL LOW (ref 22–32)
Chloride: 116 mmol/L — ABNORMAL HIGH (ref 101–111)
Creatinine, Ser: 1.03 mg/dL — ABNORMAL HIGH (ref 0.44–1.00)
GFR calc non Af Amer: 50 mL/min — ABNORMAL LOW (ref >60)
GFR, EST AFRICAN AMERICAN: 57 mL/min — AB (ref >60)
GLUCOSE: 127 mg/dL — AB (ref 65–99)
POTASSIUM: 3.8 mmol/L (ref 3.5–5.1)
SODIUM: 144 mmol/L (ref 135–145)
TOTAL PROTEIN: 6 g/dL — AB (ref 6.5–8.1)

## 2017-01-01 LAB — GLUCOSE, CAPILLARY
GLUCOSE-CAPILLARY: 128 mg/dL — AB (ref 65–99)
GLUCOSE-CAPILLARY: 163 mg/dL — AB (ref 65–99)
Glucose-Capillary: 114 mg/dL — ABNORMAL HIGH (ref 65–99)
Glucose-Capillary: 112 mg/dL — ABNORMAL HIGH (ref 65–99)

## 2017-01-01 LAB — MAGNESIUM: Magnesium: 1.7 mg/dL (ref 1.7–2.4)

## 2017-01-01 MED ORDER — DIPHENOXYLATE-ATROPINE 2.5-0.025 MG PO TABS
2.0000 | ORAL_TABLET | Freq: Every day | ORAL | Status: DC | PRN
  Administered 2017-01-08: 2 via ORAL
  Filled 2017-01-01: qty 2

## 2017-01-01 MED ORDER — METOPROLOL TARTRATE 50 MG PO TABS
50.0000 mg | ORAL_TABLET | Freq: Two times a day (BID) | ORAL | Status: DC
  Administered 2017-01-01 – 2017-01-02 (×2): 50 mg via ORAL
  Filled 2017-01-01 (×2): qty 1

## 2017-01-01 MED ORDER — LOSARTAN POTASSIUM 50 MG PO TABS
50.0000 mg | ORAL_TABLET | Freq: Every day | ORAL | Status: DC
  Administered 2017-01-02 – 2017-01-08 (×7): 50 mg via ORAL
  Filled 2017-01-01 (×7): qty 1

## 2017-01-01 MED ORDER — MAGNESIUM SULFATE 4 GM/100ML IV SOLN
4.0000 g | Freq: Once | INTRAVENOUS | Status: AC
  Administered 2017-01-01: 4 g via INTRAVENOUS
  Filled 2017-01-01: qty 100

## 2017-01-01 MED ORDER — SODIUM CHLORIDE 0.45 % IV SOLN
INTRAVENOUS | Status: DC
  Filled 2017-01-01: qty 1000

## 2017-01-01 MED ORDER — SODIUM CHLORIDE 0.45 % IV SOLN
INTRAVENOUS | Status: DC
  Administered 2017-01-01 – 2017-01-02 (×2): via INTRAVENOUS

## 2017-01-01 NOTE — Progress Notes (Signed)
Tele notified this RN that patient had a 2.28 second pause. BP is 126/69. Patient is asymptomatic but on cardizem drip. Pulse currently is 86. K. Schorr NP made aware. Will continue to monitor.

## 2017-01-01 NOTE — Progress Notes (Signed)
Dr. Wille Glaser with cardiology made aware of patients 2.28 second pause. No new orders received. Will continue to monitor patient.

## 2017-01-01 NOTE — Progress Notes (Addendum)
PROGRESS NOTE    Kendra Maldonado  RWE:[id number] DOB: [birth year] DOA: 12/31/2016 PCP: Lavone Orn, MD    Brief Narrative:  83 year old female history of prior CVA, diabetes, hypertension, history of hernia, chronic diarrhea brought to the ED by family members due to increased weakness.  Patient noted to be in A. fib with RVR which is new onset was given IV Lopressor and subsequently ended up being placed on a Cardizem drip.  Cardiology consulted.   Assessment & Plan:   Principal Problem:   Atrial fibrillation with RVR ([facility]) Active Problems:   Chronic diarrhea   Stroke ([place])   Hypertension   Type 2 diabetes mellitus with vascular disease ([facility])   HLD (hyperlipidemia)   Cerebrovascular accident (CVA) due to occlusion of right cerebellar artery ([place])   Diarrhea   Rapid atrial fibrillation ([place])  1 new onset A. fib with RVR Patient noted on admission to have new onset rapid A. fib was given 2 doses of IV Lopressor however noted to go back into rapid A. fib and had to be placed on a Cardizem drip.  Denies any chest pain.  The echo done with a EF of 55-60%, no wall motion abnormalities, mild TR, moderate pulmonary hypertension.  TSH within normal limits at 1.543.  Cardiology has been consulted will just be consulted and metoprolol dose changed to 50 mg p.o. twice daily.  Patient noted to have a prior history of CVA and due to high CHA2DS2VASC score of 8 is recommended that patient be on anticoagulation low-dose possible.  Patient currently on full dose Lovenox and will monitor over the next 24 hours and if no bleeding patient likely will benefit from being on a NOAC of Eliquis 2.5 mg 3 times daily low-dose Xarelto at 15 mg based on GFR.  Cardiology following and appreciate input and recommendations.  2.  Chronic persistent diarrhea Patient states he has had persistent diarrhea since cholecystectomy around when she was age 27.  Patient states she is followed up with GI in the outpatient  setting however cannot remember what medications she was being given with her meals to help with her stools.  Patient states her stools are both loose and soft.  Will monitor for now.  Patient will likely need to follow-up with GI in the outpatient setting.  PCR pending.  GI pathogen panel pending.  Continue Questran continue IV fluids, supportive care.  3.  Anemia Hemoglobin currently stable at 9.9.  Follow H&H.  Check an anemia panel.  4.  Hypertension Blood pressure currently stable.  Metoprolol dose has been increased to 50 mg twice daily per cardiology.  Continue Cozaar.  On Cardizem drip.  5.  Diabetes mellitus type 2 Hemoglobin A1c was 6.7 on 04/04/2015.  Check a hemoglobin A1c.  CBGs have ranged from 114-63.  Continue sliding scale insulin.    DVT prophylaxis: Full dose Lovenox Code Status: DNR Family Communication: updated patient and family at bedside. Disposition Plan: Skilled nursing facility when medically stable.   Consultants:   Cardiology: Dr. Meda Coffee 01/01/2017  Procedures:   Acute abdominal series 12/31/2016  2D echo 12/31/2016  Antimicrobials:   None   Subjective: Patient sitting up in chair states she is feeling a little bit better since admission.  Weakness slowly improving however not at baseline.  Patient states still having a combination of soft loose stools.  Patient denies any shortness of breath.  No chest pain.  Patient denies any bleeding.  Objective: Vitals:   01/01/17 0835 01/01/17 1226  01/01/17 1431 01/01/17 1507  BP: 134/76 140/88 97/86 125/70  Pulse: (!) 126 (!) 115 69 76  Resp:      Temp:   97.7 F (36.5 C)   TempSrc:   Oral   SpO2: 93%  94%   Weight:      Height:        Intake/Output Summary (Last 24 hours) at 01/01/2017 1740 Last data filed at 01/01/2017 1438 Gross per 24 hour  Intake 2820.88 ml  Output 500 ml  Net 2320.88 ml   Filed Weights   12/31/16 1408  Weight: 72.6 kg (160 lb)    Examination:  General exam:  Appears calm and comfortable  Respiratory system: Clear to auscultation. Respiratory effort normal. Cardiovascular system: Irregularly irregular. No JVD, murmurs, rubs, gallops or clicks. No pedal edema. Gastrointestinal system: Abdomen is nondistended, soft and nontender. No organomegaly or masses felt. Normal bowel sounds heard. Central nervous system: Alert and oriented. No focal neurological deficits. Extremities: Symmetric 5 x 5 power. Skin: No rashes, lesions or ulcers Psychiatry: Judgement and insight appear normal. Mood & affect appropriate.     Data Reviewed: I have personally reviewed following labs and imaging studies  CBC: Recent Labs  Lab 12/31/16 1030 01/01/17 0501  WBC 6.8 7.4  NEUTROABS 5.2  --   HGB 10.7* 9.9*  HCT 35.6* 33.3*  MCV 103.8* 103.7*  PLT 179 643   Basic Metabolic Panel: Recent Labs  Lab 12/31/16 1030 01/01/17 0501  NA 144 144  K 3.5 3.8  CL 111 116*  CO2 23 20*  GLUCOSE 135* 127*  BUN 21* 14  CREATININE 1.03* 1.03*  CALCIUM 9.0 8.6*  MG 1.8 1.7   GFR: Estimated Creatinine Clearance: 42.5 mL/min (A) (by C-G formula based on SCr of 1.03 mg/dL (H)). Liver Function Tests: Recent Labs  Lab 12/31/16 1030 01/01/17 0501  AST 23 20  ALT 54 45  ALKPHOS 152* 135*  BILITOT 0.9 0.5  PROT 6.6 6.0*  ALBUMIN 3.5 3.2*   Recent Labs  Lab 12/31/16 1030  LIPASE 21   No results for input(s): AMMONIA in the last 168 hours. Coagulation Profile: No results for input(s): INR, PROTIME in the last 168 hours. Cardiac Enzymes: No results for input(s): CKTOTAL, CKMB, CKMBINDEX, TROPONINI in the last 168 hours. BNP (last 3 results) No results for input(s): PROBNP in the last 8760 hours. HbA1C: No results for input(s): HGBA1C in the last 72 hours. CBG: Recent Labs  Lab 12/31/16 1703 12/31/16 2142 01/01/17 0753 01/01/17 1140 01/01/17 1712  GLUCAP 137* 124* 128* 114* 163*   Lipid Profile: No results for input(s): CHOL, HDL, LDLCALC, TRIG,  CHOLHDL, LDLDIRECT in the last 72 hours. Thyroid Function Tests: Recent Labs    12/31/16 1652  TSH 1.543   Anemia Panel: No results for input(s): VITAMINB12, FOLATE, FERRITIN, TIBC, IRON, RETICCTPCT in the last 72 hours. Sepsis Labs: Recent Labs  Lab 12/31/16 1047  LATICACIDVEN 1.02    No results found for this or any previous visit (from the past 240 hour(s)).       Radiology Studies: Dg Abd Acute W/chest  Result Date: 12/31/2016 CLINICAL DATA:  Weakness and abdominal pain EXAM: DG ABDOMEN ACUTE W/ 1V CHEST COMPARISON:  Chest x-ray from 12/09/2013, pelvis MRI from 02/23/2016. FINDINGS: Cardiac shadow is mildly enlarged. Postsurgical changes are noted in the left breast. The lungs are well aerated without focal infiltrate or sizable effusion. No free air is noted. Scattered large and small bowel gas is seen. No obstructive  changes are noted. Bony changes are noted in the left hemipelvis consistent with Paget's disease stable from the prior exams. No acute soft tissue abnormality is noted. IMPRESSION: Chronic changes without acute abnormality. Electronically Signed   By: Inez Catalina M.D.   On: 12/31/2016 12:33        Scheduled Meds: . chlorhexidine  15 mL Mouth Rinse BID  . cholestyramine  4 g Oral TID  . enoxaparin (LOVENOX) injection  70 mg Subcutaneous Q12H  . insulin aspart  0-15 Units Subcutaneous TID WC  . insulin aspart  0-5 Units Subcutaneous QHS  . [START ON 01/02/2017] losartan  50 mg Oral Daily  . mouth rinse  15 mL Mouth Rinse q12n4p  . metoprolol tartrate  50 mg Oral BID   Continuous Infusions: . sodium chloride 100 mL/hr at 01/01/17 0835  . diltiazem (CARDIZEM) infusion 5 mg/hr (01/01/17 1724)     LOS: 1 day    Time spent: 40 mins    Irine Seal, MD [facility] Pager [phone]  If 7PM-7AM, please contact night-coverage [email or web address] Password [email or web address] 01/01/2017, 5:40 PM

## 2017-01-01 NOTE — Evaluation (Signed)
Physical Therapy Evaluation Patient Details Name: Kendra Maldonado MRN: [id number] DOB: [birth year] Today's Date: 01/01/2017   History of Present Illness  83 y.o. 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  Clinical Impression  Pt admitted with above diagnosis. Pt currently with functional limitations due to the deficits listed below (see PT Problem List). Pt will benefit from skilled PT to increase their independence and safety with mobility to allow discharge to the venue listed below.  Pt reports decreased mobility prior to admission and states she feels she needs more assistance at home.  Recommend SNF upon d/c.     Follow Up Recommendations SNF;Supervision for mobility/OOB    Equipment Recommendations  None recommended by PT    Recommendations for Other Services       Precautions / Restrictions Precautions Precautions: Fall Precaution Comments: incontinent stool      Mobility  Bed Mobility Overal bed mobility: Needs Assistance Bed Mobility: Supine to Sit     Supine to sit: Supervision;HOB elevated     General bed mobility comments: pt in chair  Transfers Overall transfer level: Needs assistance Equipment used: Rolling walker (2 wheeled) Transfers: Sit to/from Stand Sit to Stand: Min guard         General transfer comment: verbal cues for hand placement, min/guard for safety, requesting bathroom  Ambulation/Gait Ambulation/Gait assistance: Min guard Ambulation Distance (Feet): 15 Feet Assistive device: Rolling walker (2 wheeled) Gait Pattern/deviations: Step-through pattern;Decreased stride length     General Gait Details: pt ambulated  to/from bathroom, HR 116 bpm upon sitting in recliner  Stairs            Wheelchair Mobility    Modified Rankin (Stroke Patients Only)       Balance Overall balance assessment: Needs assistance         Standing balance support: Bilateral upper extremity supported Standing balance-Leahy Scale: Poor Standing balance comment: requires UE support, denies recent falls                             Pertinent Vitals/Pain Pain Assessment: 0-10 Faces Pain Scale: Hurts little more Pain Location: chronic hip/knee/back pain Pain Descriptors / Indicators: Grimacing Pain Intervention(s): Limited activity within patient's tolerance;Repositioned    Home Living Family/patient expects to be discharged to:: Skilled nursing facility Living Arrangements: Alone                    Prior Function Level of Independence: Independent with assistive device(s)         Comments: has been using a walker, family reports decreased mobility recently and assist pt as needed by bringing meals, etc     Hand Dominance        Extremity/Trunk Assessment   Upper Extremity Assessment Upper Extremity Assessment: Generalized weakness    Lower Extremity Assessment Lower Extremity Assessment: Generalized weakness       Communication   Communication: No difficulties  Cognition Arousal/Alertness: Awake/alert Behavior During Therapy: WFL for tasks assessed/performed Overall Cognitive Status: Within Functional Limits for tasks assessed  General Comments      Exercises     Assessment/Plan    PT Assessment Patient needs continued PT services  PT Problem List Decreased strength;Decreased mobility;Decreased activity tolerance;Decreased knowledge of use of DME       PT Treatment Interventions DME instruction;Therapeutic activities;Gait training;Therapeutic exercise;Patient/family education;Functional mobility  training;Balance training    PT Goals (Current goals can be found in the Care Plan section)  Acute Rehab PT Goals Patient Stated Goal: get stronger PT Goal Formulation: With patient Time For Goal Achievement: 01/08/17 Potential to Achieve Goals: Good    Frequency Min 3X/week   Barriers to discharge        Co-evaluation               AM-PAC PT "6 Clicks" Daily Activity  Outcome Measure Difficulty turning over in bed (including adjusting bedclothes, sheets and blankets)?: None Difficulty moving from lying on back to sitting on the side of the bed? : A Little Difficulty sitting down on and standing up from a chair with arms (e.g., wheelchair, bedside commode, etc,.)?: A Little Help needed moving to and from a bed to chair (including a wheelchair)?: A Little Help needed walking in hospital room?: A Little Help needed climbing 3-5 steps with a railing? : A Lot 6 Click Score: 18    End of Session   Activity Tolerance: Patient tolerated treatment well Patient left: in chair;with call bell/phone within reach;with family/visitor present Nurse Communication: Mobility status PT Visit Diagnosis: Difficulty in walking, not elsewhere classified (R26.2)    Time: 9024-0973 PT Time Calculation (min) (ACUTE ONLY): 25 min   Charges:   PT Evaluation $PT Eval Low Complexity: 1 Low     PT G CodesCarmelia Bake, PT, DPT 01/01/2017 Pager: [phone]   York Ram E 01/01/2017, 1:12 PM

## 2017-01-01 NOTE — Consult Note (Signed)
Cardiology Consultation:   Patient ID: Kendra Maldonado; [id number]; 1934-01-10   Admit date: 12/31/2016 Date of Consult: 01/01/2017  Primary Care Provider: Lavone Orn, MD Primary Cardiologist: New to Dr. Meda Coffee  Chief Complaint: weakness  Patient Profile:   Kendra Maldonado is a 83 y.o. 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 Wyline Copas.  History of Present Illness:   Kendra Maldonado has no prior cardiac history. I met her daughter-in-law Kendra Maldonado in clinic earlier this year. The patient does have a history of significant GIB/anemia with Hgb 5 in 11/2013 suspected to be due to upper GI bleeding. 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 A CT of the abdomen showed a large hiatal hernia and sigmoid diverticulosis. There was no obvious source of bleeding discovered. The CT also showed a lung nodule. In 03/2015 she was admitted with acute cerebellar stroke with high grade proximal right posterior inferior cerebellar artery stenosis seen on MRA corresponding with distribution. Neurology recommended aspirin and Plavix for 3 months followed by Plavix alone given h/o GIB. She also had an episode of SVT reportedly during that admission with event monitor recommended but do not see this occurred. Earlier this year in 01/2016 she was evaluated by ortho for concern for myeloma given recurrent anemia with Hgb 7.4; later notes indicate this was felt to be chronic Paget's disease of the pelvis. She saw heme/onc and was felt to have MGUS. BMB not pursued given her elderly age thus surveillance was recommended.  She was brought to [facility] for concern for increased weakness and possible inability to  care for self living home alone. She has been struggling with worsening back pain and knee pain. She ambulates with a walker and even with that, not very much. She was recently seen in ED 11/26 with back pain. Urine culture came back as E Coli, does not appear she received treatment. She had recently been afraid of eating food given concerns for resultant diarrhea (this has been present since having cholecystectomy). Her family was worried about her progressive decline over the last month. Upon arrival to the ED, she was incidentally noted to have atrial fib with RVR with HR 120s-130s. She was completely unaware of this - no recent CP, SOB, palpitations, dizziness or syncope. No edema, orthopnea, PND. Labs revealed Hgb 10.7->9.9 this admission (this was up to 11-12 in 2017, but down to 7.4 in 01/2016). She had neg troponin and lactic acid, albumin 3.2, BUN/Cr 21/1.03, normal TSH. 2D echo this admission showed EF 55-60%, no RWMA, moderate pulm HTN PAS 46mHg, mild TR - prior echo in 03/2015 showed grade 2 DD, severe LAE, PASP 34.  She's been treated with 185mIV diltiazem + drip, received 2 doses of IV metoprolol yesterday, and started on oral metoprolol yesterday PM (was on home med list but was not taking). She remains on diltiazem drip at 1021mour. She continues to have frequent diarrhea. HR 105-110.  Past Medical History:  Diagnosis Date  . Back pain   . [place]   lumpect[place]  . Cerebrovascular disease    a. 03/2015 - high grade proximal right posterior inferior cerebellar artery stenosis seen on MRA corresponding with distribution of  stroke at that time.  . Chronic diarrhea   . CKD (chronic kidney disease), stage II   . Diabetes mellitus type 2, uncontrolled ([place])   . Essential hypertension   . Hiatal hernia    Stenosis of the spine  . Lung nodule   . MGUS (monoclonal gammopathy of unknown significance)   . Paget's disease of bone   . Spinal stenosis   . Stroke  ([place]) 03/2015  . Symptomatic anemia   . Upper GI bleed 11/2013    Past Surgical History:  Procedure Laterality Date  . BREAST SURGERY    . CHOLECYSTECTOMY    . ESOPHAGOGASTRODUODENOSCOPY (EGD) WITH PROPOFOL N/A 12/10/2013   Procedure: ESOPHAGOGASTRODUODENOSCOPY (EGD) WITH PROPOFOL;  Surgeon: Jeryl Columbia, MD;  Location: [facility] ENDOSCOPY;  Service: Endoscopy;  Laterality: N/A;  . HEMORRHOID SURGERY    . PARTIAL HYSTERECTOMY       Inpatient Medications: Scheduled Meds: . chlorhexidine  15 mL Mouth Rinse BID  . cholestyramine  4 g Oral TID  . enoxaparin (LOVENOX) injection  70 mg Subcutaneous Q12H  . insulin aspart  0-15 Units Subcutaneous TID WC  . insulin aspart  0-5 Units Subcutaneous QHS  . losartan  100 mg Oral Daily  . mouth rinse  15 mL Mouth Rinse q12n4p  . metoprolol tartrate  25 mg Oral BID   Continuous Infusions: . sodium chloride 100 mL/hr at 01/01/17 0835  . diltiazem (CARDIZEM) infusion 10 mg/hr (01/01/17 0839)   PRN Meds: labetalol  Home Meds: Prior to Admission medications   Medication Sig Start Date End Date Taking? Authorizing Provider  acetaminophen (TYLENOL) 500 MG tablet Take 1,000 mg by mouth every 6 (six) hours as needed for mild pain.   Yes [provider]  aspirin EC 81 MG EC tablet Take 1 tablet (81 mg total) by mouth daily. 04/06/15  Yes Hosie Poisson, MD  Cholecalciferol (D 2000) 2000 UNITS TABS Take 2,000 Units by mouth daily.   Yes [provider]  diclofenac sodium (VOLTAREN) 1 % GEL Apply 1 application topically 3 (three) times daily as needed for pain. 12/25/16  Yes [provider]  diphenoxylate-atropine (LOMOTIL) 2.5-0.025 MG per tablet Take 2 tablets by mouth daily as needed for diarrhea or loose stools.  09/20/13  Yes [provider]  ferrous sulfate 325 (65 FE) MG tablet Take 1 tablet (325 mg total) by mouth daily with breakfast. Patient taking differently: Take 325 mg by mouth 2 (two) times daily with a meal.   12/11/13  Yes Sid Falcon, MD  LORazepam (ATIVAN) 0.5 MG tablet Take 0.5 mg by mouth 2 (two) times daily as needed for anxiety. 12/28/16  Yes [provider]  losartan (COZAAR) 100 MG tablet Take 100 mg by mouth.   Yes [provider]  metFORMIN (GLUCOPHAGE) 500 MG tablet Take 500 mg by mouth 2 (two) times daily. 10/07/13  Yes [provider]  omeprazole (PRILOSEC) 20 MG capsule Take 20 mg by mouth daily.   Yes [provider]  traMADol (ULTRAM) 50 MG tablet Take 1 tablet (50 mg total) by mouth every 6 (six) hours as needed. 12/16/16  Yes Virgel Manifold, MD  atorvastatin (LIPITOR) 40 MG tablet Take 1 tablet (40 mg total) by mouth daily at 6 PM. Patient not taking: Reported on 12/31/2016 04/06/15   Hosie Poisson, MD  cephALEXin (KEFLEX) 500 MG capsule Take 1 capsule (500 mg total) by mouth 3 (three) times daily. Patient not taking: Reported on 12/31/2016 12/16/16  Virgel Manifold, MD  clopidogrel (PLAVIX) 75 MG tablet Take 1 tablet (75 mg total) by mouth daily. Patient not taking: Reported on 12/23/2016 04/06/15   Hosie Poisson, MD  metoprolol tartrate (LOPRESSOR) 25 MG tablet Take 0.5 tablets (12.5 mg total) by mouth 2 (two) times daily. Patient not taking: Reported on 12/31/2016 04/06/15   Hosie Poisson, MD    Allergies:    Allergies  Allergen Reactions  . Tramadol     DIZZY, "WENT OUT"  . Codeine Rash  . Demerol [Meperidine] Rash    Social History:   Social History   Socioeconomic History  . Marital status: Married    Spouse name: Not on file  . Number of children: 2  . Years of education: 70  . Highest education level: Not on file  Social Needs  . Financial resource strain: Not on file  . Food insecurity - worry: Not on file  . Food insecurity - inability: Not on file  . Transportation needs - medical: Not on file  . Transportation needs - non-medical: Not on file  Occupational History  . Occupation: Retired    Comment: [employer]    Tobacco Use  . Smoking status: Never Smoker  . Smokeless tobacco: Never Used  Substance and Sexual Activity  . Alcohol use: No  . Drug use: No  . Sexual activity: Not on file  Other Topics Concern  . Not on file  Social History Narrative   Widowed, lives home alone   Has 2 sons and 4 grandchildren   Right-handed   Rarely drinks caffeine    Family History:   The patient's family history includes Cancer in her brother; Dementia in her father; Hypertension in her mother.  ROS:  Please see the history of present illness.  All other ROS reviewed and negative.     Physical Exam/Data:   Vitals:   12/31/16 2142 01/01/17 0423 01/01/17 0527 01/01/17 0835  BP: (!) 127/92  134/87 134/76  Pulse: 84 (!) 105 93 (!) 126  Resp: (!) 24  20   Temp: 98 F (36.7 C)  98 F (36.7 C)   TempSrc: Oral  Oral   SpO2: 98%  93% 93%  Weight:      Height:        Intake/Output Summary (Last 24 hours) at 01/01/2017 1037 Last data filed at 01/01/2017 0600 Gross per 24 hour  Intake 1844.25 ml  Output 500 ml  Net 1344.25 ml   Filed Weights   12/31/16 1408  Weight: 160 lb (72.6 kg)   Body mass index is 24.69 kg/m.  General: Well developed, well nourished WF, in no acute distress. Head: Normocephalic, atraumatic, sclera non-icteric, no xanthomas, nares are without discharge.  Neck: Negative for carotid bruits. JVD not elevated. Lungs: Clear bilaterally to auscultation without wheezes, rales, or rhonchi. Breathing is unlabored. Heart: Irregularly irregular, borderline rate, with S1 S2. No murmurs, rubs, or gallops appreciated. Abdomen: Soft, non-tender, non-distended with normoactive bowel sounds. No hepatomegaly. No rebound/guarding. No obvious abdominal masses. Msk:  Strength and tone appear normal for age. Extremities: No clubbing or cyanosis. No edema.  Distal pedal pulses are 2+ and equal bilaterally. Neuro: Alert and oriented X 3. No facial asymmetry. No focal deficit. Moves all  extremities spontaneously. Psych:  Responds to questions appropriately with a normal affect.  EKG:  The EKG was personally reviewed and demonstrates atrial fib 129bpm, low voltage, extremity and precordial leads, nonspecific ST-T changes inferiorly and V2-V3  Relevant CV Studies: As  above  Laboratory Data:  Chemistry Recent Labs  Lab 12/31/16 1030 01/01/17 0501  NA 144 144  K 3.5 3.8  CL 111 116*  CO2 23 20*  GLUCOSE 135* 127*  BUN 21* 14  CREATININE 1.03* 1.03*  CALCIUM 9.0 8.6*  GFRNONAA 50* 50*  GFRAA 57* 57*  ANIONGAP 10 8    Recent Labs  Lab 12/31/16 1030 01/01/17 0501  PROT 6.6 6.0*  ALBUMIN 3.5 3.2*  AST 23 20  ALT 54 45  ALKPHOS 152* 135*  BILITOT 0.9 0.5   Hematology Recent Labs  Lab 12/31/16 1030 01/01/17 0501  WBC 6.8 7.4  RBC 3.43* 3.21*  HGB 10.7* 9.9*  HCT 35.6* 33.3*  MCV 103.8* 103.7*  MCH 31.2 30.8  MCHC 30.1 29.7*  RDW 13.8 13.8  PLT 179 157   Cardiac EnzymesNo results for input(s): TROPONINI in the last 168 hours.  Recent Labs  Lab 12/31/16 1046  TROPIPOC 0.00    BNPNo results for input(s): BNP, PROBNP in the last 168 hours.  DDimer No results for input(s): DDIMER in the last 168 hours.  Radiology/Studies:  Dg Abd Acute W/chest  Result Date: 12/31/2016 CLINICAL DATA:  Weakness and abdominal pain EXAM: DG ABDOMEN ACUTE W/ 1V CHEST COMPARISON:  Chest x-ray from 12/09/2013, pelvis MRI from 02/23/2016. FINDINGS: Cardiac shadow is mildly enlarged. Postsurgical changes are noted in the left breast. The lungs are well aerated without focal infiltrate or sizable effusion. No free air is noted. Scattered large and small bowel gas is seen. No obstructive changes are noted. Bony changes are noted in the left hemipelvis consistent with Paget's disease stable from the prior exams. No acute soft tissue abnormality is noted. IMPRESSION: Chronic changes without acute abnormality. Electronically Signed   By: Inez Catalina M.D.   On: 12/31/2016 12:33     Assessment and Plan:   1. Persistent diarrhea - may be contributing to her weakness, given her poor oral intake over the last month. May need GI eval while in patient to get a handle on this. Pathogen panels are pending. Patient continues to have diarrhea this AM, limiting PT's ability to work fully with her.  2. Newly recognized atrial fib RVR - patient is generally unaware of this but it is also likely contributing to her weakness. CHADSVASC 8 for HTN, age, stroke, cerebrovascular disease, and female. With her history of significant GI bleeding and anemia further complicated by MGUS, it is not clear that she is ideal candidate for anticoagulation. She is currently being trialed on full dose Lovenox but has had mild downtrend in hemoglobin. This will need to be followed. If NOAC is chosen, may consider using Xarelto instead as she would qualify for lower dose (12m) based on Estimated Creatinine Clearance: 42.5 mL/min (A) (by C-G formula based on SCr of 1.03 mg/dL (H)). She was supposed to be on Plavix prior to admission but MAR indicates she had not been taking this, but on aspirin instead. HR remains suboptimally controlled. Until we know we can fully and safely anticoagulate without interruption, would pursue rate control. This is also likely driven by diarrheal illness. I will discuss rate strategy with Dr. NMeda Coffeeas there is opportunity to consolidate some of her medicines. I will decrease tomorrow's dose of losartan in half to allow for probable AVN blocker titration.  3. ? Recent UTI - per ER labs 12/16/16. I will defer to IM regarding whether this should be treated in context of the above.  4. Flucutating anemia -  per primary team, likely multifactorial picture.  For questions or updates, please contact [place] Please consult [email or web address] for contact info under Cardiology/STEMI.   Signed, Charlie Pitter, PA-C  01/01/2017 10:37 AM   The patient was seen, examined and discussed  with Melina Copa, PA-C and I agree with the above.   A pleasant 83 y.o. 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 Wyline Copas. She has no prior cardiac history. She has a history of significant GIB/anemia with Hgb 5 in 11/2013 suspected to be due to upper GI bleeding. 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 A CT of the abdomen showed a large hiatal hernia and sigmoid diverticulosis. There was no obvious source of bleeding discovered. The CT also showed a lung nodule. In 03/2015 she was admitted with acute cerebellar stroke with high grade proximal right posterior inferior cerebellar artery stenosis seen on MRA corresponding with distribution. Neurology recommended aspirin and Plavix for 3 months followed by Plavix alone given h/o GIB.  There is a  concern for myeloma given recurrent anemia with Hgb 7.4; later notes indicate this was felt to be chronic Paget's disease of the pelvis. She saw heme/onc and was felt to have MGUS. BMB not pursued given her elderly age thus surveillance was recommended.  She was brought to [facility] for concern for increased weakness and possible inability to care for self living home alone.  She was found to be in atrial fibrillation with RVR, not present on the ECG in 2017, she is unaware of this. On metoprolol 25 mg po BID and cardizem drip at 10 mg/hr.  She is being challenged with Lovenox full dose for anticoagulation. Hb 9.9. I would suggest full anticoagulation with NOAC given prior stroke and CHDS-VASc 8 if she tolerates Lovenox. I would increase metoprolol to 50 mg PO BID. She has normal LVEF and normal size of the left ventricle.  She is being given IV fluids for fluid loss with  diarrhea.   Ena Dawley, MD 01/01/2017

## 2017-01-01 NOTE — Evaluation (Signed)
Occupational Therapy Evaluation Patient Details Name: Kendra Maldonado MRN: [id number] DOB: Feb 22, 1933 Today's Date: 01/01/2017    History of Present Illness 83 y.o. 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   Clinical Impression   Pt admitted with weakness. Pt currently with functional limitations due to the deficits listed below (see OT Problem List). Pt will benefit from skilled OT to increase their safety and independence with ADL and functional mobility for ADL to facilitate discharge to venue listed below.      Follow Up Recommendations  SNF    Equipment Recommendations  None recommended by OT    Recommendations for Other Services       Precautions / Restrictions Precautions Precautions: Fall Precaution Comments: incontinent stool      Mobility Bed Mobility               General bed mobility comments: pt in chair  Transfers Overall transfer level: Needs assistance Equipment used: Rolling walker (2 wheeled) Transfers: Sit to/from Stand Sit to Stand: Mod assist;Min assist         General transfer comment: VC for hand placement.  Pt reports increased fatigue than earlier session with PT        ADL either performed or assessed with clinical judgement   ADL                        Pt needs overall min- mod A with ADL activity. Pt does not have A at home for ADL's and needs to increase I before returning back to her home. Pt is agreeable to ST SNF                       Vision Patient Visual Report: No change from baseline              Pertinent Vitals/Pain Pain Assessment: 0-10 Faces Pain Scale: Hurts little  more Pain Location: chronic hip/knee/back pain Pain Descriptors / Indicators: Grimacing Pain Intervention(s): Limited activity within patient's tolerance;Repositioned        Extremity/Trunk Assessment Upper Extremity Assessment Upper Extremity Assessment: Generalized weakness           Communication Communication Communication: No difficulties   Cognition Arousal/Alertness: Awake/alert Behavior During Therapy: WFL for tasks assessed/performed Overall Cognitive Status: Within Functional Limits for tasks assessed                                     General Comments    Pt needs SNF for rehab prior to [place] expects to be discharged to:: Skilled nursing facility Living Arrangements: Alone                                      Prior Functioning/Environment Level of Independence: Independent with assistive device(s)        Comments: has been using a walker, family reports decreased mobility recently and assist pt as needed by bringing  meals, etc        OT Problem List: Decreased strength;Decreased activity tolerance;Impaired balance (sitting and/or standing);Decreased knowledge of use of DME or AE      OT Treatment/Interventions: Self-care/ADL training;Patient/family education;DME and/or AE instruction    OT Goals(Current goals can be found in the care plan section) Acute Rehab OT Goals Patient Stated Goal: get stronger OT Goal Formulation: With patient Time For Goal Achievement: 01/15/17 Potential to Achieve Goals: Good  OT Frequency:     Barriers to D/C: Decreased caregiver support             AM-PAC PT "6 Clicks" Daily Activity     Outcome Measure Help from another person eating meals?: None Help from another person taking care of personal grooming?: A Little Help from another person toileting, which includes using toliet, bedpan, or urinal?: A Lot Help from another person bathing  (including washing, rinsing, drying)?: A Little Help from another person to put on and taking off regular upper body clothing?: A Lot Help from another person to put on and taking off regular lower body clothing?: A Lot 6 Click Score: 16   End of Session Equipment Utilized During Treatment: Rolling walker Nurse Communication: Mobility status  Activity Tolerance: Patient limited by fatigue Patient left: in chair;with call bell/phone within reach;with family/visitor present  OT Visit Diagnosis: Unsteadiness on feet (R26.81);Muscle weakness (generalized) (M62.81)                Time: 4142-3953 OT Time Calculation (min): 18 min Charges:  OT General Charges $OT Visit: 1 Visit OT Treatments $Self Care/Home Management : 8-22 mins G-Codes:     Kari Baars, OT [phone]  Payton Mccallum D 01/01/2017, 12:57 PM

## 2017-01-02 ENCOUNTER — Encounter (HOSPITAL_COMMUNITY): Payer: Self-pay | Admitting: Internal Medicine

## 2017-01-02 DIAGNOSIS — I63541 Cerebral infarction due to unspecified occlusion or stenosis of right cerebellar artery: Secondary | ICD-10-CM

## 2017-01-02 DIAGNOSIS — D509 Iron deficiency anemia, unspecified: Secondary | ICD-10-CM

## 2017-01-02 HISTORY — DX: Iron deficiency anemia, unspecified: D50.9

## 2017-01-02 LAB — BASIC METABOLIC PANEL
Anion gap: 9 (ref 5–15)
BUN: 14 mg/dL (ref 6–20)
CALCIUM: 8.4 mg/dL — AB (ref 8.9–10.3)
CO2: 19 mmol/L — ABNORMAL LOW (ref 22–32)
CREATININE: 1.18 mg/dL — AB (ref 0.44–1.00)
Chloride: 111 mmol/L (ref 101–111)
GFR calc Af Amer: 49 mL/min — ABNORMAL LOW (ref >60)
GFR, EST NON AFRICAN AMERICAN: 42 mL/min — AB (ref >60)
GLUCOSE: 131 mg/dL — AB (ref 65–99)
Potassium: 3.4 mmol/L — ABNORMAL LOW (ref 3.5–5.1)
SODIUM: 139 mmol/L (ref 135–145)

## 2017-01-02 LAB — C DIFFICILE QUICK SCREEN W PCR REFLEX
C DIFFICILE (CDIFF) INTERP: NOT DETECTED
C DIFFICILE (CDIFF) TOXIN: NEGATIVE
C Diff antigen: NEGATIVE

## 2017-01-02 LAB — CBC
HCT: 32.2 % — ABNORMAL LOW (ref 36.0–46.0)
Hemoglobin: 9.6 g/dL — ABNORMAL LOW (ref 12.0–15.0)
MCH: 30.7 pg (ref 26.0–34.0)
MCHC: 29.8 g/dL — AB (ref 30.0–36.0)
MCV: 102.9 fL — ABNORMAL HIGH (ref 78.0–100.0)
PLATELETS: 154 10*3/uL (ref 150–400)
RBC: 3.13 MIL/uL — ABNORMAL LOW (ref 3.87–5.11)
RDW: 13.6 % (ref 11.5–15.5)
WBC: 6.2 10*3/uL (ref 4.0–10.5)

## 2017-01-02 LAB — VITAMIN B12: VITAMIN B 12: 306 pg/mL (ref 180–914)

## 2017-01-02 LAB — GLUCOSE, CAPILLARY
GLUCOSE-CAPILLARY: 204 mg/dL — AB (ref 65–99)
GLUCOSE-CAPILLARY: 132 mg/dL — AB (ref 65–99)
Glucose-Capillary: 80 mg/dL (ref 65–99)
Glucose-Capillary: 141 mg/dL — ABNORMAL HIGH (ref 65–99)

## 2017-01-02 LAB — FOLATE: Folate: 16.5 ng/mL (ref >5.9)

## 2017-01-02 LAB — IRON AND TIBC
Iron: 14 ug/dL — ABNORMAL LOW (ref 28–170)
SATURATION RATIOS: 5 % — AB (ref 10.4–31.8)
TIBC: 294 ug/dL (ref 250–450)
UIBC: 280 ug/dL

## 2017-01-02 LAB — FERRITIN: Ferritin: 15 ng/mL (ref 11–307)

## 2017-01-02 LAB — MAGNESIUM: MAGNESIUM: 2.4 mg/dL (ref 1.7–2.4)

## 2017-01-02 MED ORDER — DILTIAZEM HCL ER COATED BEADS 120 MG PO CP24
120.0000 mg | ORAL_CAPSULE | Freq: Every day | ORAL | Status: DC
  Administered 2017-01-02 – 2017-01-04 (×3): 120 mg via ORAL
  Filled 2017-01-02 (×3): qty 1

## 2017-01-02 MED ORDER — METOPROLOL TARTRATE 50 MG PO TABS
75.0000 mg | ORAL_TABLET | Freq: Two times a day (BID) | ORAL | Status: DC
  Administered 2017-01-02: 22:00:00 75 mg via ORAL
  Filled 2017-01-02: qty 1

## 2017-01-02 MED ORDER — FERUMOXYTOL INJECTION 510 MG/17 ML
510.0000 mg | Freq: Once | INTRAVENOUS | Status: AC
  Administered 2017-01-02: 510 mg via INTRAVENOUS
  Filled 2017-01-02: qty 17

## 2017-01-02 MED ORDER — METOPROLOL TARTRATE 25 MG PO TABS
25.0000 mg | ORAL_TABLET | Freq: Once | ORAL | Status: AC
  Administered 2017-01-02: 25 mg via ORAL
  Filled 2017-01-02: qty 1

## 2017-01-02 MED ORDER — POTASSIUM CHLORIDE CRYS ER 20 MEQ PO TBCR
40.0000 meq | EXTENDED_RELEASE_TABLET | Freq: Once | ORAL | Status: AC
  Administered 2017-01-02: 40 meq via ORAL
  Filled 2017-01-02: qty 2

## 2017-01-02 NOTE — Clinical Social Work Note (Signed)
Clinical Social Work Assessment  Patient Details  Name: Kendra Maldonado MRN: [id number] Date of Birth: Oct 24, 1933  Date of referral:  01/02/17               Reason for consult:  Facility Placement                Permission sought to share information with:  Facility Sport and exercise psychologist, Family Supports Permission granted to share information::  Yes, Verbal Permission Granted  Name::     Kendra Maldonado  Agency::     Relationship::  Daughter in [place]:     Housing/Transportation Living arrangements for the past 2 months:  Single Family Home Source of Information:  Patient Patient Interpreter Needed:  None Criminal Activity/Legal Involvement Pertinent to Current Situation/Hospitalization:  No - Comment as needed Significant Relationships:  Adult Children Lives with:  Self Do you feel safe going back to the place where you live?  (PT recommending SNF) Need for family participation in patient care:  Yes (Comment)  Care giving concerns:  Patient from home alone. Patient reported that she has been having issues walking for several weeks and that her family assists her in the home. PT recommending SNF.   Social Worker assessment / plan:  CSW spoke with patient/patient's family at bedside regarding PT recommendation for SNF. Patient/patient's family agreeable to SNF for ST rehab. Patient reports that she prefers [facility] or [facility].   CSW completed FL2 will follow up with SNF preferences. CSW will continue to follow and assist patient with discharge planning.  Employment status:  Retired Nurse, adult PT Recommendations:  Not assessed at this time [place] / Referral to community resources:  [place]  Patient/Family's Response to care:  Patient/patient's family agreeable to SNF for [facility] re[facility]. Patient's family appreciative of CSW assistance with discharge planning.  Patient/Family's Understanding of and  Emotional Response to Diagnosis, Current Treatment, and Prognosis:  Patient initially apprehensive about SNF then later agreeable to SNF noting she needs to get her strength back. Patient/patient's family verbalized understanding of patient's diagnosis and current treatment. Patient's family involved in patient's care.   Emotional Assessment Appearance:  Appears stated age Attitude/Demeanor/Rapport:  Apprehensive Affect (typically observed):  Appropriate Orientation:  Oriented to Self, Oriented to Place, Oriented to  Time, Oriented to Situation Alcohol / Substance use:    Psych involvement (Current and /or in the community):  No (Comment)  Discharge Needs  Concerns to be addressed:  Care Coordination Readmission within the last 30 days:  No Current discharge risk:  Physical Impairment Barriers to Discharge:  Continued Medical Work up   [employer], LCSW 01/02/2017, 1:11 PM

## 2017-01-02 NOTE — Progress Notes (Signed)
Occupational Therapy Treatment Patient Details Name: Kendra Maldonado MRN: [id number] DOB: 12/13/1933 Today's Date: 01/02/2017    History of present illness 83 y.o. 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   OT comments  Rn in room with pt at end of OT session  Follow Up Recommendations  SNF    Equipment Recommendations  None recommended by OT       Precautions / Restrictions Precautions Precautions: Fall Precaution Comments: incontinent stool       Mobility Bed Mobility               General bed mobility comments: pt in chair  Transfers Overall transfer level: Needs assistance Equipment used: Rolling walker (2 wheeled) Transfers: Sit to/from [facility] Sit to Stand: Min assist              Balance Overall balance assessment: Needs assistance         Standing balance support: Bilateral upper extremity supported Standing balance-Leahy Scale: Poor Standing balance comment: requires UE support, denies recent falls                           ADL either performed or assessed with clinical judgement   ADL Overall ADL's : Needs assistance/impaired Eating/Feeding: Set up;Sitting                       Toilet Transfer: Minimal assistance;BSC;Stand-pivot Toilet Transfer Details (indicate cue type and reason): pt incontinent upon standing Toileting- Clothing Manipulation and Hygiene: Moderate assistance;Sit to/from stand         General ADL Comments: Upon standing pt incontinent.  Transferred to [facility] . Pt upset she had accident.  RN present. OT and RN provided reassurance.      Vision Patient Visual  Report: No change from baseline     Perception     Praxis      Cognition Arousal/Alertness: Awake/alert Behavior During Therapy: WFL for tasks assessed/performed Overall Cognitive Status: Within Functional Limits for tasks assessed                                                     Pertinent Vitals/ Pain       Pain Score: 3  Pain Location: chronic hip/knee/back pain      Progress Toward Goals  OT Goals(current goals can now be found in the care plan section)  Progress towards OT goals: Progressing toward goals     Plan         AM-PAC PT "6 Clicks" Daily Activity     Outcome Measure   Help from another person eating meals?: None Help from another person taking care of personal grooming?: A Little Help from another person toileting, which includes using toliet, bedpan, or urinal?: A Lot Help from another person bathing (including washing, rinsing, drying)?: A Little Help from another person to put on and taking off regular upper body clothing?: A Lot Help from another person to put on and taking off regular  lower body clothing?: A Lot 6 Click Score: 16    End of Session Equipment Utilized During Treatment: Rolling walker  OT Visit Diagnosis: Unsteadiness on feet (R26.81);Muscle weakness (generalized) (M62.81)   Activity Tolerance Patient limited by fatigue   Patient Left in chair;with call bell/phone within reach;with family/visitor present   Nurse Communication Mobility status        Time: 3343-5686 OT Time Calculation (min): 24 min  Charges: OT General Charges $OT Visit: 1 Visit OT Treatments $Self Care/Home Management : 23-37 mins  [place], [place]   Betsy Pries 01/02/2017, 12:39 PM

## 2017-01-02 NOTE — Plan of Care (Signed)
Pt incontinent X 1 with bowels.

## 2017-01-02 NOTE — Progress Notes (Signed)
Progress Note  Patient Name: Kendra Maldonado Date of Encounter: 01/02/2017  Primary Cardiologist: Dr. Meda Coffee (new)  Subjective   Feeling a little stronger today. No diarrhea yet but she states she just ate and expects it to happen soon. She reports chronically black stools which she attributes to iron supplement at home.  Inpatient Medications    Scheduled Meds: . chlorhexidine  15 mL Mouth Rinse BID  . cholestyramine  4 g Oral TID  . enoxaparin (LOVENOX) injection  70 mg Subcutaneous Q12H  . insulin aspart  0-15 Units Subcutaneous TID WC  . insulin aspart  0-5 Units Subcutaneous QHS  . losartan  50 mg Oral Daily  . mouth rinse  15 mL Mouth Rinse q12n4p  . metoprolol tartrate  50 mg Oral BID   Continuous Infusions: . diltiazem (CARDIZEM) infusion 5 mg/hr (01/02/17 0937)  . ferumoxytol     PRN Meds: diphenoxylate-atropine, labetalol   Vital Signs    Vitals:   01/01/17 2057 01/01/17 2211 01/02/17 0135 01/02/17 0555  BP: 131/70 126/69  (!) 147/77  Pulse: 70 86 75 76  Resp: 18   18  Temp: 98.2 F (36.8 C)   98.1 F (36.7 C)  TempSrc: Oral   Oral  SpO2: 94%   91%  Weight:      Height:        Intake/Output Summary (Last 24 hours) at 01/02/2017 1051 Last data filed at 01/02/2017 0600 Gross per 24 hour  Intake 1695.04 ml  Output 600 ml  Net 1095.04 ml   Filed Weights   12/31/16 1408  Weight: 160 lb (72.6 kg)    Telemetry    Atrial fib rates generally 90-low 100s- Personally Reviewed  Physical Exam   GEN: No acute distress.  HEENT: Normocephalic, atraumatic, sclera non-icteric. Neck: No JVD or bruits. Cardiac: Irregularly irregular, borderline tachycardic, no murmurs, rubs, or gallops.  Radials/DP/PT 1+ and equal bilaterally.  Respiratory: Clear to auscultation bilaterally. Breathing is unlabored. GI: Soft, nontender, non-distended, BS +x 4. MS: no deformity. Extremities: No clubbing or cyanosis. No edema. Distal pedal pulses are 2+ and equal  bilaterally. Neuro:  AAOx3. Follows commands. Psych:  Responds to questions appropriately with a normal affect.  Labs    Chemistry Recent Labs  Lab 12/31/16 1030 01/01/17 0501 01/02/17 0538  NA 144 144 139  K 3.5 3.8 3.4*  CL 111 116* 111  CO2 23 20* 19*  GLUCOSE 135* 127* 131*  BUN 21* 14 14  CREATININE 1.03* 1.03* 1.18*  CALCIUM 9.0 8.6* 8.4*  PROT 6.6 6.0*  --   ALBUMIN 3.5 3.2*  --   AST 23 20  --   ALT 54 45  --   ALKPHOS 152* 135*  --   BILITOT 0.9 0.5  --   GFRNONAA 50* 50* 42*  GFRAA 57* 57* 49*  ANIONGAP 10 8 9      Hematology Recent Labs  Lab 12/31/16 1030 01/01/17 0501 01/02/17 0538  WBC 6.8 7.4 6.2  RBC 3.43* 3.21* 3.13*  HGB 10.7* 9.9* 9.6*  HCT 35.6* 33.3* 32.2*  MCV 103.8* 103.7* 102.9*  MCH 31.2 30.8 30.7  MCHC 30.1 29.7* 29.8*  RDW 13.8 13.8 13.6  PLT 179 157 154    Cardiac EnzymesNo results for input(s): TROPONINI in the last 168 hours.  Recent Labs  Lab 12/31/16 1046  TROPIPOC 0.00     BNPNo results for input(s): BNP, PROBNP in the last 168 hours.   DDimer No results for input(s): DDIMER  in the last 168 hours.   Radiology    Dg Abd Acute W/chest  Result Date: 12/31/2016 CLINICAL DATA:  Weakness and abdominal pain EXAM: DG ABDOMEN ACUTE W/ 1V CHEST COMPARISON:  Chest x-ray from 12/09/2013, pelvis MRI from 02/23/2016. FINDINGS: Cardiac shadow is mildly enlarged. Postsurgical changes are noted in the left breast. The lungs are well aerated without focal infiltrate or sizable effusion. No free air is noted. Scattered large and small bowel gas is seen. No obstructive changes are noted. Bony changes are noted in the left hemipelvis consistent with Paget's disease stable from the prior exams. No acute soft tissue abnormality is noted. IMPRESSION: Chronic changes without acute abnormality. Electronically Signed   By: Inez Catalina M.D.   On: 12/31/2016 12:33    Cardiac Studies   2D Echo 12/31/16 Study Conclusions - Left ventricle: The  cavity size was normal. Wall thickness was   normal. Systolic function was normal. The estimated ejection   fraction was in the range of 55% to 60%. Wall motion was normal;   there were no regional wall motion abnormalities. - Mitral valve: Calcified annulus. - Pulmonary arteries: Systolic pressure was moderately increased.   PA peak pressure: 53 mm Hg (S). - Pericardium, extracardiac: A trivial pericardial effusion was   identified. Impressions: - Normal LV function; mild TR; moderate pulmonary hypertension.  Patient Profile     83 y.o. 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 She was found to have new onset atrial fib RVR.   Assessment & Plan    1. Persistent diarrhea - may be contributing to her weakness, given her poor oral intake over the last month. GI on board, pathogen panels pending.  2. Newly recognized atrial fib RVR - patient is generally unaware of this but it is also likely contributing to her weakness. CHADSVASC 8 for HTN, age, stroke, cerebrovascular disease, and female. With her history of significant GI bleeding and anemia further complicated by MGUS, it is not clear that she is ideal candidate for anticoagulation. She is currently being trialed on full dose Lovenox but continues to have mild downtrend in hemoglobin. This will need to be followed. If NOAC is chosen, may consider using Xarelto instead as she would qualify for lower dose (15mg ) based on Estimated Creatinine Clearance: 42.5 mL/min (A) (by C-G formula based on SCr of 1.03 mg/dL (H)). She actually would qualify for higher dose of  Eliquis but I would be concerned to put her on full dose anticoag given her history. She was supposed to be on Plavix prior to admission but patient states someone had told her aspirin would be fine since she had been doing relatively well. Until we know we can fully and safely anticoagulate without interruption, would pursue rate control. This is also likely driven by diarrheal illness. She remains on a dilt gtt at 5mg /hr. Asymptomatic pause overnight noted (<3 sec) - consider OP eval for OSA. Will stop dilt drip and titrate Lopressor to 75mg  BID.  3. ? Recent UTI - per ER labs, E Coli on culture 12/16/16. I will defer to IM regarding whether this should be treated in context of the above.  4. Flucutating anemia - per primary team, likely multifactorial picture. I asked patient to make nurse aware next time she is going to have a BM so that stools can be sent for hemoccult. Hemoglobin continues to downtrend.   5. Hypokalemia - primary team repleting.  For questions or updates, please contact [place] Please consult [email or web address] for contact info under Cardiology/STEMI.  Signed, Charlie Pitter, PA-C 01/02/2017, 10:51 AM    The patient was seen, examined and discussed with Melina Copa, PA-C and I agree with the above.   A pleasant 83 y.o. 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 Wyline Copas. She has no prior cardiac history. She has a history of significant GIB/anemia with Hgb 5 in 11/2013 suspected to be due to upper GI bleeding. 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 There was no obvious source of bleeding  discovered. The CT also showed a lung nodule. In 03/2015 she was admitted with acute cerebellar stroke with high grade proximal right posterior inferior cerebellar artery stenosis seen on MRA corresponding with distribution. Neurology recommended aspirin and Plavix for 3 months followed by Plavix alone given h/o GIB.  There is a  concern for myeloma given recurrent anemia with Hgb 7.4; later notes indicate this was felt to be chronic Paget's disease of the pelvis. She saw heme/onc and was felt to have MGUS. BMB not pursued given her elderly age thus surveillance was recommended.  She was brought to [facility] for concern for increased weakness and possible inability to care for self living home alone.  She was found to be in atrial fibrillation with RVR, not present on the ECG in 2017, she is unaware of this. On metoprolol 25 mg po BID and cardizem drip at 10 mg/hr.  She is being challenged with Lovenox full dose for anticoagulation. Hb 9.9->9.6. I would suggest full anticoagulation with NOAC given prior stroke and CHDS-VASc 8 if she tolerates Lovenox. Agree with Xarelto 15 mg po daily. HR is controlled today in 90', I would discontinue iv cardizem, currently at 5 mg/hr and start cardizem CD 120 mg po daily, we will follow ventricular rates and uptitrate metoprolol or diltiazem as needed and tolerated by BP. She has normal LVEF and normal size of the left ventricle.  She is being given IV fluids for fluid loss with diarrhea.   Ena Dawley, MD 01/02/2017

## 2017-01-02 NOTE — NC FL2 (Signed)
[place] LEVEL OF CARE SCREENING TOOL     IDENTIFICATION  Patient Name: Kendra Maldonado Birthdate: 10/24/1933 Sex: female Admission Date (Current Location): 12/31/2016  [facility] and [place] Number:  Herbalist and Address:  [facility],  [place], [place]      Provider Number: [id number]  Attending Physician Name and Address:  Eugenie Filler, MD  Relative Name and Phone Number:       Current Level of Care: Hospital Recommended Level of Care: [place] Prior Approval Number:    Date Approved/Denied:   PASRR Number: [id number]  Discharge Plan: SNF    Current Diagnoses: Patient Active Problem List   Diagnosis Date Noted  . Iron deficiency anemia 01/02/2017  . Atrial fibrillation with RVR ([place])   . Diarrhea 12/31/2016  . Rapid atrial fibrillation ([place]) 12/31/2016  . MGUS (monoclonal gammopathy of unknown significance) 05/15/2016  . HLD (hyperlipidemia) 04/05/2015  . Cerebrovascular accident (CVA) due to occlusion of right cerebellar artery ([place])   . Stroke ([place]) 04/04/2015  . Cerebellar stroke, acute ([place]) 04/04/2015  . Hypertension 04/04/2015  . Stroke (cerebrum) ([place]) 04/04/2015  . Type 2 diabetes mellitus with vascular disease ([place]) 04/04/2015  . Anemia due to GI blood loss   . Symptomatic anemia   . Acute posthemorrhagic anemia 12/09/2013  . Upper GI bleed 12/09/2013  . Back pain 12/09/2013  . Chronic diarrhea 12/09/2013    Orientation RESPIRATION BLADDER Height & Weight     Self, Time, Situation, Place  Normal Incontinent Weight: 160 lb (72.6 kg) Height:  5' 7.5" (171.5 cm)  BEHAVIORAL SYMPTOMS/MOOD NEUROLOGICAL BOWEL NUTRITION STATUS        Diet(heart healthy/carb modified)  AMBULATORY STATUS COMMUNICATION OF NEEDS Skin   Limited Assist Verbally Normal                       Personal Care Assistance Level of Assistance  Bathing, Feeding, Dressing Bathing Assistance: Limited  assistance Feeding assistance: Independent Dressing Assistance: Maximum assistance     Functional Limitations Info  Sight, Hearing, Speech Sight Info: Adequate Hearing Info: Adequate Speech Info: Adequate    SPECIAL CARE FACTORS FREQUENCY  PT (By licensed PT), OT (By licensed OT)     PT Frequency: 5x OT Frequency: 5x            Contractures Contractures Info: Not present    Additional Factors Info  Code Status, Allergies, Isolation Precautions Code Status Info: DNR Allergies Info: Tramadol;Codeine;Demerol Meperidine     Isolation Precautions Info: Enteric precautions     Current Medications (01/02/2017):  This is the current hospital active medication list Current Facility-Administered Medications  Medication Dose Route Frequency Provider Last Rate Last Dose  . chlorhexidine (PERIDEX) 0.12 % solution 15 mL  15 mL Mouth Rinse BID Donne Hazel, MD   15 mL at 01/02/17 1029  . cholestyramine (QUESTRAN) packet 4 g  4 g Oral TID Donne Hazel, MD   4 g at 01/01/17 1723  . diltiazem (CARDIZEM) 100 mg in dextrose 5 % 100 mL (1 mg/mL) infusion  5-15 mg/hr Intravenous Continuous Donne Hazel, MD 5 mL/hr at 01/02/17 8768 5 mg/hr at 01/02/17 0937  . diphenoxylate-atropine (LOMOTIL) 2.5-0.025 MG per tablet 2 tablet  2 tablet Oral Daily PRN Eugenie Filler, MD      . enoxaparin (LOVENOX) injection 70 mg  70 mg Subcutaneous Q12H Shade, Christine E, RPH   70 mg  at 01/02/17 0175  . ferumoxytol (FERAHEME) 510 mg in sodium chloride 0.9 % 100 mL IVPB  510 mg Intravenous Once Eugenie Filler, MD      . insulin aspart (novoLOG) injection 0-15 Units  0-15 Units Subcutaneous TID WC Donne Hazel, MD   5 Units at 01/02/17 1031  . insulin aspart (novoLOG) injection 0-5 Units  0-5 Units Subcutaneous QHS Donne Hazel, MD      . labetalol (NORMODYNE,TRANDATE) injection 5 mg  5 mg Intravenous Q2H PRN Donne Hazel, MD   5 mg at 12/31/16 1418  . losartan (COZAAR) tablet 50 mg  50  mg Oral Daily Dunn, Dayna N, PA-C   50 mg at 01/02/17 1029  . MEDLINE mouth rinse  15 mL Mouth Rinse q12n4p Donne Hazel, MD   15 mL at 01/01/17 1508  . metoprolol tartrate (LOPRESSOR) tablet 25 mg  25 mg Oral Once Dunn, Dayna N, PA-C      . metoprolol tartrate (LOPRESSOR) tablet 75 mg  75 mg Oral BID Charlie Pitter, PA-C         Discharge Medications: Please see discharge summary for a list of discharge medications.  Relevant Imaging Results:  Relevant Lab Results:   Additional Information SSN [id number]  Burnis Medin, LCSW

## 2017-01-02 NOTE — Progress Notes (Signed)
PROGRESS NOTE    Kendra Maldonado  CZY:[id number] DOB: 01-Oct-1933 DOA: 12/31/2016 PCP: Lavone Orn, MD    Brief Narrative:  83 year old female history of prior CVA, diabetes, hypertension, history of hernia, chronic diarrhea brought to the ED by family members due to increased weakness.  Patient noted to be in A. fib with RVR which is new onset was given IV Lopressor and subsequently ended up being placed on a Cardizem drip.  Cardiology consulted.   Assessment & Plan:   Principal Problem:   Atrial fibrillation with RVR (HCC) Active Problems:   Chronic diarrhea   Stroke ([place])   Hypertension   Type 2 diabetes mellitus with vascular disease (HCC)   HLD (hyperlipidemia)   Cerebrovascular accident (CVA) due to occlusion of right cerebellar artery (HCC)   Diarrhea   Rapid atrial fibrillation (HCC)   Iron deficiency anemia  1 new onset A. fib with RVR Patient noted on admission to have new onset rapid A. fib was given 2 doses of IV Lopressor however noted to go back into rapid A. fib and had to be placed on a Cardizem drip.  Denies any chest pain.  The echo done with a EF of 55-60%, no wall motion abnormalities, mild TR, moderate pulmonary hypertension.  TSH within normal limits at 1.543.  Cardiology has been consulted and metoprolol dose changed to 75 mg p.o. twice daily.  Cardizem drip to be discontinued today as patient's rate seems to be controlled per cardiology.  Patient noted to have a prior history of CVA and due to high CHA2DS2VASC score of 8 is recommended that patient be on anticoagulation low-dose possible.  Patient currently on full dose Lovenox and no further bleeding.  Will monitor over the next 24 hours and if no bleeding patient likely will benefit from being on a NOAC of Eliquis 2.5 mg two times daily or low-dose Xarelto at 15 mg based on GFR.  Cardiology following and appreciate input and recommendations.  2.  Chronic persistent diarrhea Patient states he has had persistent  diarrhea since cholecystectomy around when she was age 53.  Patient states she is followed up with GI in the outpatient setting however cannot remember what medications she was being given with her meals to help with her stools.  Patient states her stools are both loose and soft.  Consistency of stools improving with decreasing frequency on Questran.  C. difficile PCR still pending a GI pathogen panel.  Continue supportive care.  Outpatient follow-up with GI.   3.  Iron deficiency anemia Hemoglobin currently stable at 9.6.  Follow H&H.  Anemia panel consistent with iron deficiency anemia.  We will give a dose of IV Feraheme x1.    4.  Hypertension Blood pressure currently stable.  Metoprolol dose has been increased to 75 mg twice daily per cardiology.  Continue Cozaar.  Cardizem drip to be discontinued per cardiology today.   5.  Diabetes mellitus type 2 Hemoglobin A1c was 6.7 on 04/04/2015.  Check a hemoglobin A1c.  CBGs have ranged from 80 - 204.  Continue sliding scale insulin.    DVT prophylaxis: Full dose Lovenox Code Status: DNR Family Communication: updated patient and family at bedside. Disposition Plan: Skilled nursing facility when medically stable.   Consultants:   Cardiology: Dr. Meda Coffee 01/01/2017  Procedures:   Acute abdominal series 12/31/2016  2D echo 12/31/2016  Antimicrobials:   None   Subjective: Patient sitting up in chair.  Patient states weakness improving.  Patient with no bowel movement  this morning.  Patient and per nursing stools are becoming more soft than loose.  Tolerating oral intake.  Heart rate seems to be controlled in the 70s-80s.  No bleeding noted.  Objective: Vitals:   01/01/17 2057 01/01/17 2211 01/02/17 0135 01/02/17 0555  BP: 131/70 126/69  (!) 147/77  Pulse: 70 86 75 76  Resp: 18   18  Temp: 98.2 F (36.8 C)   98.1 F (36.7 C)  TempSrc: Oral   Oral  SpO2: 94%   91%  Weight:      Height:        Intake/Output Summary (Last 24  hours) at 01/02/2017 1139 Last data filed at 01/02/2017 0600 Gross per 24 hour  Intake 1695.04 ml  Output 600 ml  Net 1095.04 ml   Filed Weights   12/31/16 1408  Weight: 72.6 kg (160 lb)    Examination:  General exam: NAD. Respiratory system: Clear to auscultation.  No wheezes, no crackles, no rhonchi.  Respiratory effort normal. Cardiovascular system: Irregularly irregular. No JVD, murmurs, rubs, gallops or clicks. No pedal edema. Gastrointestinal system: Abdomen is nondistended, soft and nontender. No organomegaly or masses felt. Normal bowel sounds heard. Central nervous system: Alert and oriented. No focal neurological deficits. Extremities: Symmetric 5 x 5 power. Skin: No rashes, lesions or ulcers Psychiatry: Judgement and insight appear normal. Mood & affect appropriate.     Data Reviewed: I have personally reviewed following labs and imaging studies  CBC: Recent Labs  Lab 12/31/16 1030 01/01/17 0501 01/02/17 0538  WBC 6.8 7.4 6.2  NEUTROABS 5.2  --   --   HGB 10.7* 9.9* 9.6*  HCT 35.6* 33.3* 32.2*  MCV 103.8* 103.7* 102.9*  PLT 179 157 510   Basic Metabolic Panel: Recent Labs  Lab 12/31/16 1030 01/01/17 0501 01/02/17 0538  NA 144 144 139  K 3.5 3.8 3.4*  CL 111 116* 111  CO2 23 20* 19*  GLUCOSE 135* 127* 131*  BUN 21* 14 14  CREATININE 1.03* 1.03* 1.18*  CALCIUM 9.0 8.6* 8.4*  MG 1.8 1.7 2.4   GFR: Estimated Creatinine Clearance: 37.1 mL/min (A) (by C-G formula based on SCr of 1.18 mg/dL (H)). Liver Function Tests: Recent Labs  Lab 12/31/16 1030 01/01/17 0501  AST 23 20  ALT 54 45  ALKPHOS 152* 135*  BILITOT 0.9 0.5  PROT 6.6 6.0*  ALBUMIN 3.5 3.2*   Recent Labs  Lab 12/31/16 1030  LIPASE 21   No results for input(s): AMMONIA in the last 168 hours. Coagulation Profile: No results for input(s): INR, PROTIME in the last 168 hours. Cardiac Enzymes: No results for input(s): CKTOTAL, CKMB, CKMBINDEX, TROPONINI in the last 168  hours. BNP (last 3 results) No results for input(s): PROBNP in the last 8760 hours. HbA1C: No results for input(s): HGBA1C in the last 72 hours. CBG: Recent Labs  Lab 01/01/17 0753 01/01/17 1140 01/01/17 1712 01/01/17 2140 01/02/17 0756  GLUCAP 128* 114* 163* 112* 204*   Lipid Profile: No results for input(s): CHOL, HDL, LDLCALC, TRIG, CHOLHDL, LDLDIRECT in the last 72 hours. Thyroid Function Tests: Recent Labs    12/31/16 1652  TSH 1.543   Anemia Panel: Recent Labs    01/02/17 0538  VITAMINB12 306  FOLATE 16.5  FERRITIN 15  TIBC 294  IRON 14*   Sepsis Labs: Recent Labs  Lab 12/31/16 1047  LATICACIDVEN 1.02    No results found for this or any previous visit (from the past 240 hour(s)).  Radiology Studies: Dg Abd Acute W/chest  Result Date: 12/31/2016 CLINICAL DATA:  Weakness and abdominal pain EXAM: DG ABDOMEN ACUTE W/ 1V CHEST COMPARISON:  Chest x-ray from 12/09/2013, pelvis MRI from 02/23/2016. FINDINGS: Cardiac shadow is mildly enlarged. Postsurgical changes are noted in the left breast. The lungs are well aerated without focal infiltrate or sizable effusion. No free air is noted. Scattered large and small bowel gas is seen. No obstructive changes are noted. Bony changes are noted in the left hemipelvis consistent with Paget's disease stable from the prior exams. No acute soft tissue abnormality is noted. IMPRESSION: Chronic changes without acute abnormality. Electronically Signed   By: Inez Catalina M.D.   On: 12/31/2016 12:33        Scheduled Meds: . chlorhexidine  15 mL Mouth Rinse BID  . cholestyramine  4 g Oral TID  . enoxaparin (LOVENOX) injection  70 mg Subcutaneous Q12H  . insulin aspart  0-15 Units Subcutaneous TID WC  . insulin aspart  0-5 Units Subcutaneous QHS  . losartan  50 mg Oral Daily  . mouth rinse  15 mL Mouth Rinse q12n4p  . metoprolol tartrate  25 mg Oral Once  . metoprolol tartrate  75 mg Oral BID   Continuous  Infusions: . diltiazem (CARDIZEM) infusion 5 mg/hr (01/02/17 0937)  . ferumoxytol       LOS: 2 days    Time spent: 35 mins    Irine Seal, MD [facility] Pager [phone]  If 7PM-7AM, please contact night-coverage [email or web address] Password [facility] 01/02/2017, 11:39 AM

## 2017-01-03 ENCOUNTER — Inpatient Hospital Stay (HOSPITAL_COMMUNITY): Payer: Medicare Other

## 2017-01-03 DIAGNOSIS — K529 Noninfective gastroenteritis and colitis, unspecified: Secondary | ICD-10-CM

## 2017-01-03 LAB — CBC
HEMATOCRIT: 33.8 % — AB (ref 36.0–46.0)
HEMOGLOBIN: 10.2 g/dL — AB (ref 12.0–15.0)
MCH: 30.9 pg (ref 26.0–34.0)
MCHC: 30.2 g/dL (ref 30.0–36.0)
MCV: 102.4 fL — ABNORMAL HIGH (ref 78.0–100.0)
Platelets: 158 10*3/uL (ref 150–400)
RBC: 3.3 MIL/uL — AB (ref 3.87–5.11)
RDW: 13.4 % (ref 11.5–15.5)
WBC: 5 10*3/uL (ref 4.0–10.5)

## 2017-01-03 LAB — GASTROINTESTINAL PANEL BY PCR, STOOL (REPLACES STOOL CULTURE)
ADENOVIRUS F40/41: NOT DETECTED
ASTROVIRUS: NOT DETECTED
CYCLOSPORA CAYETANENSIS: NOT DETECTED
Campylobacter species: NOT DETECTED
Cryptosporidium: NOT DETECTED
ENTAMOEBA HISTOLYTICA: NOT DETECTED
ENTEROPATHOGENIC E COLI (EPEC): NOT DETECTED
ENTEROTOXIGENIC E COLI (ETEC): NOT DETECTED
Enteroaggregative E coli (EAEC): NOT DETECTED
Giardia lamblia: NOT DETECTED
Norovirus GI/GII: NOT DETECTED
Plesimonas shigelloides: NOT DETECTED
Rotavirus A: NOT DETECTED
SAPOVIRUS (I, II, IV, AND V): NOT DETECTED
Salmonella species: NOT DETECTED
Shiga like toxin producing E coli (STEC): NOT DETECTED
Shigella/Enteroinvasive E coli (EIEC): NOT DETECTED
VIBRIO CHOLERAE: NOT DETECTED
VIBRIO SPECIES: NOT DETECTED
Yersinia enterocolitica: NOT DETECTED

## 2017-01-03 LAB — BASIC METABOLIC PANEL
Anion gap: 6 (ref 5–15)
BUN: 14 mg/dL (ref 6–20)
CHLORIDE: 116 mmol/L — AB (ref 101–111)
CO2: 19 mmol/L — AB (ref 22–32)
Calcium: 8.8 mg/dL — ABNORMAL LOW (ref 8.9–10.3)
Creatinine, Ser: 1.25 mg/dL — ABNORMAL HIGH (ref 0.44–1.00)
GFR calc non Af Amer: 39 mL/min — ABNORMAL LOW (ref >60)
GFR, EST AFRICAN AMERICAN: 45 mL/min — AB (ref >60)
Glucose, Bld: 120 mg/dL — ABNORMAL HIGH (ref 65–99)
POTASSIUM: 3.6 mmol/L (ref 3.5–5.1)
SODIUM: 141 mmol/L (ref 135–145)

## 2017-01-03 LAB — OCCULT BLOOD X 1 CARD TO LAB, STOOL: Fecal Occult Bld: NEGATIVE

## 2017-01-03 LAB — GLUCOSE, CAPILLARY
GLUCOSE-CAPILLARY: 116 mg/dL — AB (ref 65–99)
GLUCOSE-CAPILLARY: 98 mg/dL (ref 65–99)
GLUCOSE-CAPILLARY: 144 mg/dL — AB (ref 65–99)
Glucose-Capillary: 176 mg/dL — ABNORMAL HIGH (ref 65–99)

## 2017-01-03 LAB — MAGNESIUM: MAGNESIUM: 2.1 mg/dL (ref 1.7–2.4)

## 2017-01-03 MED ORDER — LORAZEPAM 0.5 MG PO TABS
0.5000 mg | ORAL_TABLET | Freq: Two times a day (BID) | ORAL | Status: DC | PRN
  Administered 2017-01-03: 0.5 mg via ORAL
  Filled 2017-01-03: qty 1

## 2017-01-03 MED ORDER — LORATADINE 10 MG PO TABS
10.0000 mg | ORAL_TABLET | Freq: Every day | ORAL | Status: DC
  Administered 2017-01-03 – 2017-01-09 (×7): 10 mg via ORAL
  Filled 2017-01-03 (×7): qty 1

## 2017-01-03 MED ORDER — METOPROLOL TARTRATE 5 MG/5ML IV SOLN
5.0000 mg | Freq: Once | INTRAVENOUS | Status: AC
  Administered 2017-01-03: 5 mg via INTRAVENOUS
  Filled 2017-01-03: qty 5

## 2017-01-03 MED ORDER — RIVAROXABAN 15 MG PO TABS
15.0000 mg | ORAL_TABLET | Freq: Every day | ORAL | Status: DC
  Administered 2017-01-03 – 2017-01-08 (×6): 15 mg via ORAL
  Filled 2017-01-03 (×6): qty 1

## 2017-01-03 MED ORDER — RIVAROXABAN (XARELTO) EDUCATION KIT FOR AFIB PATIENTS
PACK | Freq: Once | Status: DC
  Filled 2017-01-03: qty 1

## 2017-01-03 MED ORDER — PANTOPRAZOLE SODIUM 40 MG PO TBEC
40.0000 mg | DELAYED_RELEASE_TABLET | Freq: Every day | ORAL | Status: DC
  Administered 2017-01-03 – 2017-01-06 (×4): 40 mg via ORAL
  Filled 2017-01-03 (×4): qty 1

## 2017-01-03 MED ORDER — METOPROLOL TARTRATE 50 MG PO TABS
100.0000 mg | ORAL_TABLET | Freq: Two times a day (BID) | ORAL | Status: DC
  Administered 2017-01-03 – 2017-01-09 (×13): 100 mg via ORAL
  Filled 2017-01-03 (×13): qty 2

## 2017-01-03 MED ORDER — MENTHOL 3 MG MT LOZG
1.0000 | LOZENGE | OROMUCOSAL | Status: DC | PRN
  Filled 2017-01-03: qty 9

## 2017-01-03 MED ORDER — SODIUM CHLORIDE 0.9 % IV SOLN
INTRAVENOUS | Status: DC
  Administered 2017-01-03 – 2017-01-04 (×2): via INTRAVENOUS

## 2017-01-03 NOTE — Progress Notes (Signed)
Progress Note  Patient Name: Kendra Maldonado Date of Encounter: 01/03/2017  Primary Cardiologist: Dr. Meda Coffee (new)  Subjective   Patient reports continued concern that she may soil the bed due to ongoing diarrhea that occurs almost immediately after eating. No CP or SOB. Remains unaware of tachycardia. HR up this AM.  Inpatient Medications    Scheduled Meds: . chlorhexidine  15 mL Mouth Rinse BID  . cholestyramine  4 g Oral TID  . diltiazem  120 mg Oral Daily  . enoxaparin (LOVENOX) injection  70 mg Subcutaneous Q12H  . insulin aspart  0-15 Units Subcutaneous TID WC  . insulin aspart  0-5 Units Subcutaneous QHS  . losartan  50 mg Oral Daily  . mouth rinse  15 mL Mouth Rinse q12n4p  . metoprolol tartrate  5 mg Intravenous Once  . metoprolol tartrate  100 mg Oral BID   Continuous Infusions:  PRN Meds: diphenoxylate-atropine, labetalol   Vital Signs    Vitals:   01/02/17 1307 01/02/17 1735 01/02/17 2001 01/03/17 0559  BP: 105/63 140/90 (!) 139/91 (!) 155/98  Pulse: 85 (!) 130 82 (!) 131  Resp: 18  18 20   Temp: 98 F (36.7 C)  98 F (36.7 C) 98.1 F (36.7 C)  TempSrc: Oral  Oral Oral  SpO2: 96%  94% 92%  Weight:      Height:        Intake/Output Summary (Last 24 hours) at 01/03/2017 0901 Last data filed at 01/03/2017 0500 Gross per 24 hour  Intake 36.17 ml  Output 500 ml  Net -463.83 ml   Filed Weights   12/31/16 1408  Weight: 160 lb (72.6 kg)    Telemetry    Atrial fib Rate 120-140s this AM - Personally Reviewed   Physical Exam   GEN: No acute distress.  HEENT: Normocephalic, atraumatic, sclera non-icteric. Neck: No JVD or bruits. Cardiac: Irregularly irregular, tachycardic, no murmurs, rubs, or gallops.  Radials/DP/PT 1+ and equal bilaterally.  Respiratory: Clear to auscultation bilaterally. Breathing is unlabored. GI: Soft, nontender, non-distended, BS +x 4. MS: no deformity. Extremities: No clubbing or cyanosis. No edema. Distal pedal  pulses are 2+ and equal bilaterally. Neuro:  AAOx3. Follows commands. Psych:  Responds to questions appropriately with a normal affect.  Labs    Chemistry Recent Labs  Lab 12/31/16 1030 01/01/17 0501 01/02/17 0538 01/03/17 0516  NA 144 144 139 141  K 3.5 3.8 3.4* 3.6  CL 111 116* 111 116*  CO2 23 20* 19* 19*  GLUCOSE 135* 127* 131* 120*  BUN 21* 14 14 14   CREATININE 1.03* 1.03* 1.18* 1.25*  CALCIUM 9.0 8.6* 8.4* 8.8*  PROT 6.6 6.0*  --   --   ALBUMIN 3.5 3.2*  --   --   AST 23 20  --   --   ALT 54 45  --   --   ALKPHOS 152* 135*  --   --   BILITOT 0.9 0.5  --   --   GFRNONAA 50* 50* 42* 39*  GFRAA 57* 57* 49* 45*  ANIONGAP 10 8 9 6      Hematology Recent Labs  Lab 01/01/17 0501 01/02/17 0538 01/03/17 0516  WBC 7.4 6.2 5.0  RBC 3.21* 3.13* 3.30*  HGB 9.9* 9.6* 10.2*  HCT 33.3* 32.2* 33.8*  MCV 103.7* 102.9* 102.4*  MCH 30.8 30.7 30.9  MCHC 29.7* 29.8* 30.2  RDW 13.8 13.6 13.4  PLT 157 154 158    Cardiac EnzymesNo results for  input(s): TROPONINI in the last 168 hours.  Recent Labs  Lab 12/31/16 1046  TROPIPOC 0.00     BNPNo results for input(s): BNP, PROBNP in the last 168 hours.   DDimer No results for input(s): DDIMER in the last 168 hours.   Radiology    No results found.  Cardiac Studies   2D Echo 12/31/16 Study Conclusions - Left ventricle: The cavity size was normal. Wall thickness was normal. Systolic function was normal. The estimated ejection fraction was in the range of 55% to 60%. Wall motion was normal; there were no regional wall motion abnormalities. - Mitral valve: Calcified annulus. - Pulmonary arteries: Systolic pressure was moderately increased. PA peak pressure: 53 mm Hg (S). - Pericardium, extracardiac: A trivial pericardial effusion was identified. Impressions: - Normal LV function; mild TR; moderate pulmonary hypertension.    Patient Profile     83 y.o. 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 She was found to have new onset atrial fib RVR.  Assessment & Plan    1.Persistent diarrhea -may be contributing to her weakness, given her poor oral intake over the last month. Pathogen panels pending. This remains a significant issue as she continues to have ongoing diarrhea and was incontinent of stools last night. Supportive care recommended per IM, welcome any other suggestions.  2. Newly recognized atrial fib RVR -patient is generally unaware of this but it is also likely contributing to her weakness. CHADSVASC 8 for HTN, age, stroke, cerebrovascular disease, and female. With her history of significant GI bleeding and anemia further complicated by MGUS, it is not clear that she is ideal candidate for anticoagulation. She does seem to have tolerated full dose Lovenox so far with stable Hgb today. She does report h/o black stools but this is while on iron. Will discuss transition to Xarelto 15mg  today with Dr. Meda Coffee. Her HR became unusually uncontrolled overnight. Given HR 90s yesterday, I am concerned there is a new medical process going on causing her to be tachycardic - it is not typical for atrial fib to be well controlled on escalating medication doses one day then tachycardic out of the blue the next. Additionally, when sitting her up, her back feels a bit warm. I've asked the nurse to recheck temperature. Would appreciate IM investigation for any new medical issues which could be arising. Again note recent UTI noted on ER labs from November, not clear this was ever treated or may need to be considered. Will give 5mg  IV  Lopressor now and titrate Lopressor to 100mg  BID today.  3. Flucutating anemia- per primary team, likely multifactorial picture. I asked patient to make nurse aware next time she is going to have a BM so that stools can be sent for hemoccult. Hemoglobin appears more stable today. Notified today's nurse of hemoccult order.   4. Hypokalemia - IM managing, improved.  For questions or updates, please contact [place] Please consult [email or web address] for contact info under Cardiology/STEMI.  Signed, Charlie Pitter, PA-C 01/03/2017, 9:01 AM    The patient was seen, examined and discussed with Melina Copa, PA-C and I agree with the above.   The patient was started  on cardizem CD and metoprolol yesterday with controlled ventricular rates however now in 120-130'. This is suspicious for an underlying infection. Her metoprolol was increased to 100 mg po BID today, I agree with that, if her HR remains elevated I would increase cardizem CD to 240 mg po daily.  She is being challenged with Lovenox full dose for anticoagulation. Hb 9.9->9.6->10.2. I would suggest full anticoagulation with NOAC given prior stroke and CHDS-VASc 8 if she tolerates Lovenox. Agree with Xarelto 15 mg po daily. She has normal LVEF and normal size of the left ventricle.  She should be given IV fluids for fluid loss with diarrhea.I will give 75 cc/hr for the next 10 hours.   Ena Dawley, MD 01/03/2017

## 2017-01-03 NOTE — Progress Notes (Signed)
Physical Therapy Treatment Patient Details Name: Kendra Maldonado MRN: [id number] DOB: Apr 21, 1933 Today's Date: 01/03/2017    History of Present Illness 83 y.o. 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    PT Comments    Pt glad to ambulate in hallway and tolerated well.  HR 87-120-91 bpm during session.  Pt plans to d/c to SNF.   Follow Up Recommendations  SNF;Supervision for mobility/OOB     Equipment Recommendations  None recommended by PT    Recommendations for Other Services       Precautions / Restrictions Precautions Precautions: Fall Precaution Comments: monitor HR    Mobility  Bed Mobility               General bed mobility comments: pt in chair  Transfers Overall transfer level: Needs assistance Equipment used: Rolling walker (2 wheeled) Transfers: Sit to/from Stand Sit to Stand: Min guard         General transfer comment: verbal cues for hand placement, min/guard for safety, cues for lines  Ambulation/Gait Ambulation/Gait assistance: Min guard Ambulation Distance (Feet): 120 Feet Assistive device: Rolling walker (2 wheeled) Gait Pattern/deviations: Step-through pattern;Decreased stride length     General Gait Details: slow but steady gait with RW, distance to tolerance, pt happy to be walking farther distances again   [employer] Mobility    Modified Rankin (Stroke Patients Only)       Balance                                            Cognition Arousal/Alertness: Awake/alert Behavior During Therapy: WFL for tasks assessed/performed Overall Cognitive Status: Within  Functional Limits for tasks assessed                                        Exercises      General Comments        Pertinent Vitals/Pain Pain Assessment: 0-10 Pain Score: 4  Pain Location: chronic hip/knee/back pain Pain Descriptors / Indicators: Grimacing Pain Intervention(s): Repositioned;Limited activity within patient's tolerance;Monitored during session    Home Living                      Prior Function            PT Goals (current goals can now be found in the care plan section) Progress towards PT goals: Progressing toward goals    Frequency    Min 3X/week      PT Plan Current plan remains appropriate    Co-evaluation              AM-PAC PT "6 Clicks" Daily Activity  Outcome Measure  Difficulty turning over in bed (including adjusting bedclothes, sheets and blankets)?: None Difficulty moving from lying on back to sitting on the side of the bed? : A Little Difficulty sitting down on and standing up from a chair with arms (e.g., wheelchair,  bedside commode, etc,.)?: A Little Help needed moving to and from a bed to chair (including a wheelchair)?: A Little Help needed walking in hospital room?: A Little Help needed climbing 3-5 steps with a railing? : A Lot 6 Click Score: 18    End of Session   Activity Tolerance: Patient tolerated treatment well Patient left: in chair;with call bell/phone within reach Nurse Communication: Mobility status PT Visit Diagnosis: Difficulty in walking, not elsewhere classified (R26.2)     Time: 3664-4034 PT Time Calculation (min) (ACUTE ONLY): 17 min  Charges:  $Gait Training: 8-22 mins                    G Codes:      Carmelia Bake, PT, DPT 01/03/2017 Pager: [phone]  York Ram E 01/03/2017, 3:12 PM

## 2017-01-03 NOTE — Progress Notes (Signed)
PROGRESS NOTE    Kendra Maldonado  URK:[id number] DOB: November 18, 1933 DOA: 12/31/2016 PCP: Lavone Orn, MD    Brief Narrative:  83 year old female history of prior CVA, diabetes, hypertension, history of hernia, chronic diarrhea brought to the ED by family members due to increased weakness.  Patient noted to be in A. fib with RVR which is new onset was given IV Lopressor and subsequently ended up being placed on a Cardizem drip.  Cardiology consulted.   Assessment & Plan:   Principal Problem:   Atrial fibrillation with RVR (HCC) Active Problems:   Chronic diarrhea   Stroke ([place])   Hypertension   Type 2 diabetes mellitus with vascular disease (HCC)   HLD (hyperlipidemia)   Cerebrovascular accident (CVA) due to occlusion of right cerebellar artery (HCC)   Diarrhea   Rapid atrial fibrillation (HCC)   Iron deficiency anemia  1 new onset A. fib with RVR Patient noted on admission to have new onset rapid A. fib was given 2 doses of IV Lopressor however noted to go back into rapid A. fib and had to be placed on a Cardizem drip.  Denied any chest pain.  The echo done with a EF of 55-60%, no wall motion abnormalities, mild TR, moderate pulmonary hypertension.  TSH within normal limits at 1.543.  Cardiology has been consulted and metoprolol dose changed to 75 mg p.o. twice daily.  Cardizem drip was discontinued 01/02/2017, as patient's rate seemed to be controlled per cardiology.  Patient noted to have a prior history of CVA and due to high CHA2DS2VASC score of 8 is recommended that patient be on anticoagulation low-dose possible.  Patient currently on full dose Lovenox and no further bleeding.  After Cardizem drip was discontinued and patient placed on oral Cardizem patient noted to have heart rates increasing into the 130s.  Cardizem dose has been increased to 240 mg daily per cardiology as well as metoprolol dose increased 200 mg twice daily.  Patient has been placed on IV fluids per cardiology.  We  will discontinue Lovenox as patient has had no bleeding and start patient on Xarelto 15 mg daily per cardiology recommendations.  Cardiology following and appreciate input and recommendations.  2.  Chronic persistent diarrhea Patient states he has had persistent diarrhea since cholecystectomy around when she was age 77.  Patient states she is followed up with GI in the outpatient setting however cannot remember what medications she was being given with her meals to help with her stools.  Patient states her stools are both loose and soft.  Consistency of stools improving with decreasing frequency on Questran.  C. difficile PCR negative.  GI pathogen panel pending.  Continue supportive care.  Outpatient follow-up with GI.   3.  Iron deficiency anemia Hemoglobin currently stable at 9.6.  Follow H&H.  Anemia panel consistent with iron deficiency anemia. s/p IV Feraheme x1.  Likely benefit from oral iron supplementation on discharge.  4.  Hypertension Blood pressure currently stable.  Metoprolol dose has been increased to 75 mg twice daily per cardiology.  Continue Cozaar.  Cardizem drip was discontinued and patient placed on oral Cardizem.  Cardiology following.   5.  Diabetes mellitus type 2 Hemoglobin A1c was 6.7 on 04/04/2015.  Check a hemoglobin A1c.  CBGs have ranged from 98 - 176.  Continue sliding scale insulin.    DVT prophylaxis: Full dose Lovenox Code Status: DNR Family Communication: updated patient and family at bedside. Disposition Plan: Skilled nursing facility when medically stable and  heart rate controlled..   Consultants:   Cardiology: Dr. Meda Coffee 01/01/2017  Procedures:   Acute abdominal series 12/31/2016  2D echo 12/31/2016  Antimicrobials:   None   Subjective: Patient up in bed.  Weakness improving.  Per family one small soft bowel movement yesterday.  Per family number of stools have decreased in consistency improved.  Patient tolerating oral intake.  Family  concerned patient might have a component of anxiety and was supposed to be started on anxiolytic prior to admission.  Patient noted to be in A. fib with heart rates in the 130s now once Cardizem drip was discontinued.    Objective: Vitals:   01/02/17 1735 01/02/17 2001 01/03/17 0559 01/03/17 0925  BP: 140/90 (!) 139/91 (!) 155/98   Pulse: (!) 130 82 (!) 131   Resp:  18 20   Temp:  98 F (36.7 C) 98.1 F (36.7 C) 97.8 F (36.6 C)  TempSrc:  Oral Oral Oral  SpO2:  94% 92%   Weight:      Height:        Intake/Output Summary (Last 24 hours) at 01/03/2017 1139 Last data filed at 01/03/2017 0500 Gross per 24 hour  Intake 16.17 ml  Output 500 ml  Net -483.83 ml   Filed Weights   12/31/16 1408  Weight: 72.6 kg (160 lb)    Examination:  General exam: NAD. Respiratory system: Clear to auscultation.  No wheezes, no crackles, no rhonchi.  Respiratory effort normal. Cardiovascular system: Irregularly irregular. No JVD, murmurs, rubs, gallops or clicks. No pedal edema. Gastrointestinal system: Abdomen is soft, nontender, nondistended, positive bowel sounds. Central nervous system: Alert and oriented. No focal neurological deficits. Extremities: Symmetric 5 x 5 power. Skin: No rashes, lesions or ulcers Psychiatry: Judgement and insight appear normal. Mood & affect appropriate.     Data Reviewed: I have personally reviewed following labs and imaging studies  CBC: Recent Labs  Lab 12/31/16 1030 01/01/17 0501 01/02/17 0538 01/03/17 0516  WBC 6.8 7.4 6.2 5.0  NEUTROABS 5.2  --   --   --   HGB 10.7* 9.9* 9.6* 10.2*  HCT 35.6* 33.3* 32.2* 33.8*  MCV 103.8* 103.7* 102.9* 102.4*  PLT 179 157 154 841   Basic Metabolic Panel: Recent Labs  Lab 12/31/16 1030 01/01/17 0501 01/02/17 0538 01/03/17 0516  NA 144 144 139 141  K 3.5 3.8 3.4* 3.6  CL 111 116* 111 116*  CO2 23 20* 19* 19*  GLUCOSE 135* 127* 131* 120*  BUN 21* 14 14 14   CREATININE 1.03* 1.03* 1.18* 1.25*  CALCIUM  9.0 8.6* 8.4* 8.8*  MG 1.8 1.7 2.4 2.1   GFR: Estimated Creatinine Clearance: 35 mL/min (A) (by C-G formula based on SCr of 1.25 mg/dL (H)). Liver Function Tests: Recent Labs  Lab 12/31/16 1030 01/01/17 0501  AST 23 20  ALT 54 45  ALKPHOS 152* 135*  BILITOT 0.9 0.5  PROT 6.6 6.0*  ALBUMIN 3.5 3.2*   Recent Labs  Lab 12/31/16 1030  LIPASE 21   No results for input(s): AMMONIA in the last 168 hours. Coagulation Profile: No results for input(s): INR, PROTIME in the last 168 hours. Cardiac Enzymes: No results for input(s): CKTOTAL, CKMB, CKMBINDEX, TROPONINI in the last 168 hours. BNP (last 3 results) No results for input(s): PROBNP in the last 8760 hours. HbA1C: No results for input(s): HGBA1C in the last 72 hours. CBG: Recent Labs  Lab 01/02/17 0756 01/02/17 1208 01/02/17 1702 01/02/17 2113 01/03/17 0803  GLUCAP 204*  132* 80 141* 116*   Lipid Profile: No results for input(s): CHOL, HDL, LDLCALC, TRIG, CHOLHDL, LDLDIRECT in the last 72 hours. Thyroid Function Tests: Recent Labs    12/31/16 1652  TSH 1.543   Anemia Panel: Recent Labs    01/02/17 0538  VITAMINB12 306  FOLATE 16.5  FERRITIN 15  TIBC 294  IRON 14*   Sepsis Labs: Recent Labs  Lab 12/31/16 1047  LATICACIDVEN 1.02    Recent Results (from the past 240 hour(s))  C difficile quick scan w PCR reflex     Status: None   Collection Time: 01/02/17 12:51 PM  Result Value Ref Range Status   C Diff antigen NEGATIVE NEGATIVE Final   C Diff toxin NEGATIVE NEGATIVE Final   C Diff interpretation No C. difficile detected.  Final         Radiology Studies: No results found.      Scheduled Meds: . chlorhexidine  15 mL Mouth Rinse BID  . cholestyramine  4 g Oral TID  . diltiazem  120 mg Oral Daily  . enoxaparin (LOVENOX) injection  70 mg Subcutaneous Q12H  . insulin aspart  0-15 Units Subcutaneous TID WC  . insulin aspart  0-5 Units Subcutaneous QHS  . losartan  50 mg Oral Daily  .  mouth rinse  15 mL Mouth Rinse q12n4p  . metoprolol tartrate  100 mg Oral BID   Continuous Infusions: . sodium chloride       LOS: 3 days    Time spent: 35 mins    Irine Seal, MD [facility] Pager [phone]  If 7PM-7AM, please contact night-coverage [email or web address] Password [facility] 01/03/2017, 11:39 AM

## 2017-01-03 NOTE — Discharge Instructions (Signed)
Information on my medicine - XARELTO (Rivaroxaban)  This medication education was reviewed with me or my healthcare representative as part of my discharge preparation.  The pharmacist that spoke with me during my hospital stay was:  Jarred Purtee A, RPH  Why was Xarelto prescribed for you? Xarelto was prescribed for you to reduce the risk of a blood clot forming that can cause a stroke if you have a medical condition called atrial fibrillation (a type of irregular heartbeat).  What do you need to know about xarelto ? Take your Xarelto ONCE DAILY at the same time every day with your evening meal. If you have difficulty swallowing the tablet whole, you may crush it and mix in applesauce just prior to taking your dose.  Take Xarelto exactly as prescribed by your doctor and DO NOT stop taking Xarelto without talking to the doctor who prescribed the medication.  Stopping without other stroke prevention medication to take the place of Xarelto may increase your risk of developing a clot that causes a stroke.  Refill your prescription before you run out.  After discharge, you should have regular check-up appointments with your healthcare provider that is prescribing your Xarelto.  In the future your dose may need to be changed if your kidney function or weight changes by a significant amount.  What do you do if you miss a dose? If you are taking Xarelto ONCE DAILY and you miss a dose, take it as soon as you remember on the same day then continue your regularly scheduled once daily regimen the next day. Do not take two doses of Xarelto at the same time or on the same day.   Important Safety Information A possible side effect of Xarelto is bleeding. You should call your healthcare provider right away if you experience any of the following: ? Bleeding from an injury or your nose that does not stop. ? Unusual colored urine (red or dark brown) or unusual colored stools (red or black). ? Unusual  bruising for unknown reasons. ? A serious fall or if you hit your head (even if there is no bleeding).  Some medicines may interact with Xarelto and might increase your risk of bleeding while on Xarelto. To help avoid this, consult your healthcare provider or pharmacist prior to using any new prescription or non-prescription medications, including herbals, vitamins, non-steroidal anti-inflammatory drugs (NSAIDs) and supplements.  This website has more information on Xarelto: www.xarelto.com.   

## 2017-01-03 NOTE — Clinical Social Work Placement (Signed)
   CLINICAL SOCIAL WORK PLACEMENT  NOTE  Date:  01/03/2017  Patient Details  Name: Kendra Maldonado MRN: [id number] Date of Birth: [birth year]  Clinical Social Work is seeking post-discharge placement for this patient at the [place] level of care (*CSW will initial, date and re-position this form in  chart as items are completed):  Yes   Patient/family provided with [place] Work Department's list of facilities offering this level of care within the geographic area requested by the patient (or if unable, by the patient's family).  Yes   Patient/family informed of their freedom to choose among providers that offer the needed level of care, that participate in Medicare, Medicaid or managed care program needed by the patient, have an available bed and are willing to accept the patient.  Yes   Patient/family informed of [facility]'s ownership interest in [facility] and [facility], as well as of the fact that they are under no obligation to receive care at these facilities.  PASRR submitted to EDS on 01/02/17     PASRR number received on 01/02/17     Existing PASRR number confirmed on       FL2 transmitted to all facilities in geographic area requested by pt/family on 01/02/17     FL2 transmitted to all facilities within larger geographic area on       Patient informed that his/her managed care company has contracts with or will negotiate with certain facilities, including the following:        Yes   Patient/family informed of bed offers received.  Patient chooses bed at       Physician recommends and patient chooses bed at [facility], [place]    Patient to be transferred to   on  .  Patient to be transferred to facility by       Patient family notified on   of transfer.  Name of family member notified:        PHYSICIAN       Additional Comment:    _______________________________________________ Burnis Medin,  LCSW 01/03/2017, 1:43 PM

## 2017-01-03 NOTE — NC FL2 (Signed)
[place] LEVEL OF CARE SCREENING TOOL     IDENTIFICATION  Patient Name: Kendra Maldonado Birthdate: [birth year] Sex: female Admission Date (Current Location): 12/31/2016  [facility] and [place] Number:  Herbalist and Address:  [facility],  [address], [place]      Provider Number: [id number]  Attending Physician Name and Address:  Eugenie Filler, MD  Relative Name and Phone Number:       Current Level of Care: Hospital Recommended Level of Care: [place] Prior Approval Number:    Date Approved/Denied:   PASRR Number: [id number]  Discharge Plan: SNF    Current Diagnoses: Patient Active Problem List   Diagnosis Date Noted  . Iron deficiency anemia 01/02/2017  . Atrial fibrillation with RVR ([place])   . Diarrhea 12/31/2016  . Rapid atrial fibrillation ([place]) 12/31/2016  . MGUS (monoclonal gammopathy of unknown significance) 05/15/2016  . HLD (hyperlipidemia) 04/05/2015  . Cerebrovascular accident (CVA) due to occlusion of right cerebellar artery ([place])   . Stroke ([place]) 04/04/2015  . Cerebellar stroke, acute ([place]) 04/04/2015  . Hypertension 04/04/2015  . Stroke (cerebrum) ([place]) 04/04/2015  . Type 2 diabetes mellitus with vascular disease ([place]) 04/04/2015  . Anemia due to GI blood loss   . Symptomatic anemia   . Acute posthemorrhagic anemia 12/09/2013  . Upper GI bleed 12/09/2013  . Back pain 12/09/2013  . Chronic diarrhea 12/09/2013    Orientation RESPIRATION BLADDER Height & Weight     Self, Time, Situation, Place  Normal Incontinent Weight: 72.6 kg (160 lb) Height:  5' 7.5" (171.5 cm)  BEHAVIORAL SYMPTOMS/MOOD NEUROLOGICAL BOWEL NUTRITION STATUS        Diet(heart healthy/carb modified)  AMBULATORY STATUS COMMUNICATION OF NEEDS Skin   Limited Assist Verbally Normal                       Personal Care Assistance Level of Assistance  Bathing, Feeding, Dressing Bathing Assistance: Limited  assistance Feeding assistance: Independent Dressing Assistance: Maximum assistance     Functional Limitations Info  Sight, Hearing, Speech Sight Info: Adequate Hearing Info: Adequate Speech Info: Adequate    SPECIAL CARE FACTORS FREQUENCY  PT (By licensed PT), OT (By licensed OT)     PT Frequency: 5x OT Frequency: 5x            Contractures Contractures Info: Not present    Additional Factors Info  Code Status, Allergies, Isolation Precautions Code Status Info: DNR Allergies Info: Tramadol;Codeine;Demerol Meperidine     Isolation Precautions Info: Enteric precautions     Current Medications (01/03/2017):  This is the current hospital active medication list Current Facility-Administered Medications  Medication Dose Route Frequency Provider Last Rate Last Dose  . 0.9 %  sodium chloride infusion   Intravenous Continuous Dorothy Spark, MD      . chlorhexidine (PERIDEX) 0.12 % solution 15 mL  15 mL Mouth Rinse BID Donne Hazel, MD   15 mL at 01/03/17 0926  . cholestyramine (QUESTRAN) packet 4 g  4 g Oral TID Donne Hazel, MD   4 g at 01/02/17 2359  . diltiazem (CARDIZEM CD) 24 hr capsule 120 mg  120 mg Oral Daily Dorothy Spark, MD   120 mg at 01/03/17 0926  . diphenoxylate-atropine (LOMOTIL) 2.5-0.025 MG per tablet 2 tablet  2 tablet Oral Daily PRN Eugenie Filler, MD      . enoxaparin (LOVENOX) injection 70 mg  70 mg Subcutaneous Q12H Randa Spike, RPH   70 mg at 01/03/17 0734  . insulin aspart (novoLOG) injection 0-15 Units  0-15 Units Subcutaneous TID WC Donne Hazel, MD   2 Units at 01/02/17 1224  . insulin aspart (novoLOG) injection 0-5 Units  0-5 Units Subcutaneous QHS Donne Hazel, MD      . labetalol (NORMODYNE,TRANDATE) injection 5 mg  5 mg Intravenous Q2H PRN Donne Hazel, MD   5 mg at 01/02/17 1737  . losartan (COZAAR) tablet 50 mg  50 mg Oral Daily Dunn, Dayna N, PA-C   50 mg at 01/03/17 1216  . MEDLINE mouth rinse  15 mL Mouth  Rinse q12n4p Donne Hazel, MD   15 mL at 01/02/17 1536  . metoprolol tartrate (LOPRESSOR) tablet 100 mg  100 mg Oral BID Dunn, Dayna N, PA-C   100 mg at 01/03/17 2446     Discharge Medications: Please see discharge summary for a list of discharge medications.  Relevant Imaging Results:  Relevant Lab Results:   Additional Information SSN [id number]  Irine Seal, MD

## 2017-01-04 LAB — GLUCOSE, CAPILLARY
GLUCOSE-CAPILLARY: 108 mg/dL — AB (ref 65–99)
Glucose-Capillary: 115 mg/dL — ABNORMAL HIGH (ref 65–99)
Glucose-Capillary: 142 mg/dL — ABNORMAL HIGH (ref 65–99)
Glucose-Capillary: 111 mg/dL — ABNORMAL HIGH (ref 65–99)

## 2017-01-04 LAB — CBC
HEMATOCRIT: 33.5 % — AB (ref 36.0–46.0)
HEMOGLOBIN: 10.1 g/dL — AB (ref 12.0–15.0)
MCH: 31.2 pg (ref 26.0–34.0)
MCHC: 30.1 g/dL (ref 30.0–36.0)
MCV: 103.4 fL — AB (ref 78.0–100.0)
Platelets: 179 10*3/uL (ref 150–400)
RBC: 3.24 MIL/uL — ABNORMAL LOW (ref 3.87–5.11)
RDW: 13.4 % (ref 11.5–15.5)
WBC: 5.9 10*3/uL (ref 4.0–10.5)

## 2017-01-04 LAB — BASIC METABOLIC PANEL
Anion gap: 10 (ref 5–15)
BUN: 19 mg/dL (ref 6–20)
CALCIUM: 8.4 mg/dL — AB (ref 8.9–10.3)
CHLORIDE: 113 mmol/L — AB (ref 101–111)
CO2: 18 mmol/L — AB (ref 22–32)
CREATININE: 1.11 mg/dL — AB (ref 0.44–1.00)
GFR calc Af Amer: 53 mL/min — ABNORMAL LOW (ref >60)
GFR calc non Af Amer: 45 mL/min — ABNORMAL LOW (ref >60)
GLUCOSE: 106 mg/dL — AB (ref 65–99)
Potassium: 3.5 mmol/L (ref 3.5–5.1)
Sodium: 141 mmol/L (ref 135–145)

## 2017-01-04 LAB — HEMOGLOBIN A1C
Hgb A1c MFr Bld: 5.6 % (ref 4.8–5.6)
Mean Plasma Glucose: 114 mg/dL

## 2017-01-04 MED ORDER — DILTIAZEM HCL ER COATED BEADS 180 MG PO CP24
180.0000 mg | ORAL_CAPSULE | Freq: Every day | ORAL | Status: DC
  Administered 2017-01-05: 180 mg via ORAL
  Filled 2017-01-04: qty 1

## 2017-01-04 MED ORDER — DILTIAZEM HCL 60 MG PO TABS
60.0000 mg | ORAL_TABLET | Freq: Once | ORAL | Status: AC
  Administered 2017-01-04: 60 mg via ORAL
  Filled 2017-01-04: qty 1

## 2017-01-04 NOTE — Progress Notes (Signed)
PROGRESS NOTE    Kendra Maldonado  QQI:[id number] DOB: 11-Jul-1933 DOA: 12/31/2016 PCP: Lavone Orn, MD    Brief Narrative:  83 year old female history of prior CVA, diabetes, hypertension, history of hernia, chronic diarrhea brought to the ED by family members due to increased weakness.  Patient noted to be in A. fib with RVR which is new onset was given IV Lopressor and subsequently ended up being placed on a Cardizem drip.  Cardiology consulted.   Assessment & Plan:   Principal Problem:   Atrial fibrillation with RVR (HCC) Active Problems:   Chronic diarrhea   Stroke ([place])   Hypertension   Type 2 diabetes mellitus with vascular disease (HCC)   HLD (hyperlipidemia)   Cerebrovascular accident (CVA) due to occlusion of right cerebellar artery (HCC)   Diarrhea   Rapid atrial fibrillation (HCC)   Iron deficiency anemia  1 new onset A. fib with RVR Patient noted on admission to have new onset rapid A. fib was given 2 doses of IV Lopressor however noted to go back into rapid A. fib and had to be placed on a Cardizem drip.  Denied any chest pain.  The echo done with a EF of 55-60%, no wall motion abnormalities, mild TR, moderate pulmonary hypertension.  TSH within normal limits at 1.543.  Cardiology has been consulted and metoprolol dose changed to 100 mg p.o. twice daily.  Cardizem drip was discontinued 01/02/2017, as patient's rate seemed to be controlled per cardiology.  Patient noted to have a prior history of CVA and due to high CHA2DS2VASC score of 8 is recommended that patient be on anticoagulation low-dose possible.  Patient currently on full dose Lovenox and no further bleeding.  After Cardizem drip was discontinued and patient placed on oral Cardizem patient noted to have heart rates increasing into the 130s.  Cardizem dose has been increased to 120 mg daily per cardiology as well as metoprolol dose increased 100 mg twice daily.  Patient has been placed on IV fluids per cardiology.   Saline lock IV fluids. Discontinued Lovenox as patient has had no bleeding and started patient on Xarelto 15 mg daily per cardiology recommendations.  Heart rate still somewhat elevated and as such Cardizem CD has been increased to 180 mg daily per cardiology to start on 01/05/2017.  Patient already received Cardizem CD 120 mg daily today.  We will give a dose of Cardizem 60 mg p.o. x1.  Cardiology following and appreciate input and recommendations.  2.  Chronic persistent diarrhea Patient states he has had persistent diarrhea since cholecystectomy around when she was age 72.  Patient states she is followed up with GI in the outpatient setting however cannot remember what medications she was being given with her meals to help with her stools.  Patient states her stools are both loose and soft.  Consistency of stools improved with decreasing frequency on Questran.  C. difficile PCR negative.  GI pathogen panel negative. Continue supportive care.  Outpatient follow-up with GI.   3.  Iron deficiency anemia Hemoglobin currently stable at 10.1.  Follow H&H.  Anemia panel consistent with iron deficiency anemia. s/p IV Feraheme x1.  Likely benefit from oral iron supplementation on discharge.  4.  Hypertension Blood pressure currently stable.  Metoprolol dose has been increased to 100 mg twice daily per cardiology.  Continue Cozaar.  Cardizem drip was discontinued and patient placed on oral Cardizem.  Cardiology following.   5.  Well controlled diabetes mellitus type 2 Hemoglobin A1c was  6.7 on 04/04/2015.  Hemoglobin A1c 5.6 on 01/03/2017.  CBGs have ranged from 115 -144.  Continue sliding scale insulin.    DVT prophylaxis: Full dose Lovenox Code Status: DNR Family Communication: updated patient and family at bedside. Disposition Plan: Skilled nursing facility when medically stable and heart rate controlled, and per cardiology..   Consultants:   Cardiology: Dr. Meda Coffee 01/01/2017  Procedures:    Acute abdominal series 12/31/2016  2D echo 12/31/2016  Antimicrobials:   None   Subjective: Patient up in bed.  Weakness improving.  Denies any shortness of breath.  No chest pain.  Patient frustrated that she still in the hospital and unable to be discharged to the skilled nursing facility.  Per family number of stools have decreased in consistency improved.  Patient tolerating oral intake.  Family concerned patient might have a component of anxiety and was supposed to be started on anxiolytic prior to admission.    Objective: Vitals:   01/03/17 0925 01/03/17 1319 01/03/17 2019 01/04/17 0509  BP:  125/77 126/83 133/89  Pulse:  (!) 120 81 (!) 105  Resp:  20 20 20   Temp: 97.8 F (36.6 C) 97.8 F (36.6 C) 97.9 F (36.6 C) 98.2 F (36.8 C)  TempSrc: Oral Oral Oral Oral  SpO2:  93% 96% 95%  Weight:      Height:        Intake/Output Summary (Last 24 hours) at 01/04/2017 1307 Last data filed at 01/04/2017 0700 Gross per 24 hour  Intake 588.75 ml  Output 1100 ml  Net -511.25 ml   Filed Weights   12/31/16 1408  Weight: 72.6 kg (160 lb)    Examination:  General exam: NAD. Respiratory system: Clear to auscultation.  No wheezes, no crackles, no rhonchi.  Respiratory effort normal. Cardiovascular system: Irregularly irregular. No JVD, murmurs, rubs, gallops or clicks. No pedal edema. Gastrointestinal system: Abdomen is nontender, soft, nondistended, positive bowel sounds.   Central nervous system: Alert and oriented. No focal neurological deficits. Extremities: Symmetric 5 x 5 power. Skin: No rashes, lesions or ulcers Psychiatry: Judgement and insight appear normal. Mood & affect appropriate.     Data Reviewed: I have personally reviewed following labs and imaging studies  CBC: Recent Labs  Lab 12/31/16 1030 01/01/17 0501 01/02/17 0538 01/03/17 0516 01/04/17 0530  WBC 6.8 7.4 6.2 5.0 5.9  NEUTROABS 5.2  --   --   --   --   HGB 10.7* 9.9* 9.6* 10.2* 10.1*   HCT 35.6* 33.3* 32.2* 33.8* 33.5*  MCV 103.8* 103.7* 102.9* 102.4* 103.4*  PLT 179 157 154 158 664   Basic Metabolic Panel: Recent Labs  Lab 12/31/16 1030 01/01/17 0501 01/02/17 0538 01/03/17 0516 01/04/17 0530  NA 144 144 139 141 141  K 3.5 3.8 3.4* 3.6 3.5  CL 111 116* 111 116* 113*  CO2 23 20* 19* 19* 18*  GLUCOSE 135* 127* 131* 120* 106*  BUN 21* 14 14 14 19   CREATININE 1.03* 1.03* 1.18* 1.25* 1.11*  CALCIUM 9.0 8.6* 8.4* 8.8* 8.4*  MG 1.8 1.7 2.4 2.1  --    GFR: Estimated Creatinine Clearance: 39.4 mL/min (A) (by C-G formula based on SCr of 1.11 mg/dL (H)). Liver Function Tests: Recent Labs  Lab 12/31/16 1030 01/01/17 0501  AST 23 20  ALT 54 45  ALKPHOS 152* 135*  BILITOT 0.9 0.5  PROT 6.6 6.0*  ALBUMIN 3.5 3.2*   Recent Labs  Lab 12/31/16 1030  LIPASE 21   No results for  input(s): AMMONIA in the last 168 hours. Coagulation Profile: No results for input(s): INR, PROTIME in the last 168 hours. Cardiac Enzymes: No results for input(s): CKTOTAL, CKMB, CKMBINDEX, TROPONINI in the last 168 hours. BNP (last 3 results) No results for input(s): PROBNP in the last 8760 hours. HbA1C: Recent Labs    01/03/17 0516  HGBA1C 5.6   CBG: Recent Labs  Lab 01/03/17 1246 01/03/17 1742 01/03/17 2239 01/04/17 0729 01/04/17 1135  GLUCAP 176* 98 144* 115* 142*   Lipid Profile: No results for input(s): CHOL, HDL, LDLCALC, TRIG, CHOLHDL, LDLDIRECT in the last 72 hours. Thyroid Function Tests: No results for input(s): TSH, T4TOTAL, FREET4, T3FREE, THYROIDAB in the last 72 hours. Anemia Panel: Recent Labs    01/02/17 0538  VITAMINB12 306  FOLATE 16.5  FERRITIN 15  TIBC 294  IRON 14*   Sepsis Labs: Recent Labs  Lab 12/31/16 1047  LATICACIDVEN 1.02    Recent Results (from the past 240 hour(s))  C difficile quick scan w PCR reflex     Status: None   Collection Time: 01/02/17 12:51 PM  Result Value Ref Range Status   C Diff antigen NEGATIVE NEGATIVE  Final   C Diff toxin NEGATIVE NEGATIVE Final   C Diff interpretation No C. difficile detected.  Final  Gastrointestinal Panel by PCR , Stool     Status: None   Collection Time: 01/02/17 12:51 PM  Result Value Ref Range Status   Campylobacter species NOT DETECTED NOT DETECTED Final   Plesimonas shigelloides NOT DETECTED NOT DETECTED Final   Salmonella species NOT DETECTED NOT DETECTED Final   Yersinia enterocolitica NOT DETECTED NOT DETECTED Final   Vibrio species NOT DETECTED NOT DETECTED Final   Vibrio cholerae NOT DETECTED NOT DETECTED Final   Enteroaggregative E coli (EAEC) NOT DETECTED NOT DETECTED Final   Enteropathogenic E coli (EPEC) NOT DETECTED NOT DETECTED Final   Enterotoxigenic E coli (ETEC) NOT DETECTED NOT DETECTED Final   Shiga like toxin producing E coli (STEC) NOT DETECTED NOT DETECTED Final   Shigella/Enteroinvasive E coli (EIEC) NOT DETECTED NOT DETECTED Final   Cryptosporidium NOT DETECTED NOT DETECTED Final   Cyclospora cayetanensis NOT DETECTED NOT DETECTED Final   Entamoeba histolytica NOT DETECTED NOT DETECTED Final   Giardia lamblia NOT DETECTED NOT DETECTED Final   Adenovirus F40/41 NOT DETECTED NOT DETECTED Final   Astrovirus NOT DETECTED NOT DETECTED Final   Norovirus GI/GII NOT DETECTED NOT DETECTED Final   Rotavirus A NOT DETECTED NOT DETECTED Final   Sapovirus (I, II, IV, and V) NOT DETECTED NOT DETECTED Final         Radiology Studies: Dg Chest 2 View  Result Date: 01/03/2017 CLINICAL DATA:  Atrial fibrillation.  History of breast cancer. EXAM: CHEST  2 VIEW COMPARISON:  12/31/2016 FINDINGS: Increased densities at both lung bases and costophrenic angles. Findings are suggestive for pleural effusions. There is a very large hiatal hernia containing gas. Heart size is upper limits of normal but unchanged. Surgical clips in the left axilla. Upper lungs are clear without pulmonary edema. IMPRESSION: Basilar chest densities are suggestive for small  bilateral pleural effusions. Large hiatal hernia. Electronically Signed   By: Markus Daft M.D.   On: 01/03/2017 16:35        Scheduled Meds: . chlorhexidine  15 mL Mouth Rinse BID  . cholestyramine  4 g Oral TID  . [START ON 01/05/2017] diltiazem  180 mg Oral Daily  . insulin aspart  0-15 Units Subcutaneous TID  WC  . insulin aspart  0-5 Units Subcutaneous QHS  . loratadine  10 mg Oral Daily  . losartan  50 mg Oral Daily  . mouth rinse  15 mL Mouth Rinse q12n4p  . metoprolol tartrate  100 mg Oral BID  . pantoprazole  40 mg Oral Q0600  . rivaroxaban   Does not apply Once  . rivaroxaban  15 mg Oral Q supper   Continuous Infusions: . sodium chloride 75 mL/hr at 01/04/17 0300     LOS: 4 days    Time spent: 35 mins    Irine Seal, MD [facility] Pager [phone]  If 7PM-7AM, please contact night-coverage [email or web address] Password [email or web address] 01/04/2017, 1:07 PM

## 2017-01-04 NOTE — Progress Notes (Signed)
   Progress Note  Patient Name: Kendra Maldonado Date of Encounter: 01/04/2017  Primary Cardiologist: Dr. Ena Dawley  Please see full cardiology note from yesterday regarding plan.  Patient currently on Xarelto for stroke prophylaxis.  She continues on Lopressor at 100 mg twice daily and Cardizem CD at 120 mg daily.  Heart rate control of atrial fibrillation remains suboptimal with increased rates overnight and this morning. Plan to further advance Cardizem CD dose, will go to 180 mg daily next.  Signed, Rozann Lesches, MD  01/04/2017, 10:20 AM

## 2017-01-05 LAB — BASIC METABOLIC PANEL
ANION GAP: 7 (ref 5–15)
BUN: 18 mg/dL (ref 6–20)
CHLORIDE: 117 mmol/L — AB (ref 101–111)
CO2: 19 mmol/L — AB (ref 22–32)
CREATININE: 1.06 mg/dL — AB (ref 0.44–1.00)
Calcium: 8.8 mg/dL — ABNORMAL LOW (ref 8.9–10.3)
GFR calc non Af Amer: 48 mL/min — ABNORMAL LOW (ref >60)
GFR, EST AFRICAN AMERICAN: 56 mL/min — AB (ref >60)
Glucose, Bld: 106 mg/dL — ABNORMAL HIGH (ref 65–99)
Potassium: 3.4 mmol/L — ABNORMAL LOW (ref 3.5–5.1)
SODIUM: 143 mmol/L (ref 135–145)

## 2017-01-05 LAB — GLUCOSE, CAPILLARY
GLUCOSE-CAPILLARY: 95 mg/dL (ref 65–99)
Glucose-Capillary: 117 mg/dL — ABNORMAL HIGH (ref 65–99)
Glucose-Capillary: 182 mg/dL — ABNORMAL HIGH (ref 65–99)
Glucose-Capillary: 132 mg/dL — ABNORMAL HIGH (ref 65–99)

## 2017-01-05 LAB — CBC
HCT: 35.6 % — ABNORMAL LOW (ref 36.0–46.0)
HEMOGLOBIN: 10.4 g/dL — AB (ref 12.0–15.0)
MCH: 30.4 pg (ref 26.0–34.0)
MCHC: 29.2 g/dL — ABNORMAL LOW (ref 30.0–36.0)
MCV: 104.1 fL — ABNORMAL HIGH (ref 78.0–100.0)
PLATELETS: 197 10*3/uL (ref 150–400)
RBC: 3.42 MIL/uL — AB (ref 3.87–5.11)
RDW: 13.8 % (ref 11.5–15.5)
WBC: 6.4 10*3/uL (ref 4.0–10.5)

## 2017-01-05 LAB — HEMOGLOBIN A1C
HEMOGLOBIN A1C: 5.3 % (ref 4.8–5.6)
MEAN PLASMA GLUCOSE: 105 mg/dL

## 2017-01-05 MED ORDER — DM-GUAIFENESIN ER 30-600 MG PO TB12
1.0000 | ORAL_TABLET | Freq: Two times a day (BID) | ORAL | Status: DC
  Administered 2017-01-05 – 2017-01-09 (×7): 1 via ORAL
  Filled 2017-01-05 (×8): qty 1

## 2017-01-05 MED ORDER — POLYSACCHARIDE IRON COMPLEX 150 MG PO CAPS
150.0000 mg | ORAL_CAPSULE | Freq: Every day | ORAL | Status: DC
  Administered 2017-01-05 – 2017-01-08 (×4): 150 mg via ORAL
  Filled 2017-01-05 (×4): qty 1

## 2017-01-05 MED ORDER — POTASSIUM CHLORIDE CRYS ER 20 MEQ PO TBCR
40.0000 meq | EXTENDED_RELEASE_TABLET | Freq: Once | ORAL | Status: AC
  Administered 2017-01-05: 40 meq via ORAL
  Filled 2017-01-05: qty 2

## 2017-01-05 NOTE — Progress Notes (Signed)
PROGRESS NOTE    ANALYSE ANGST  BLT:[id number] DOB: Aug 03, 1933 DOA: 12/31/2016 PCP: Lavone Orn, MD    Brief Narrative:  83 year old female history of prior CVA, diabetes, hypertension, history of hernia, chronic diarrhea brought to the ED by family members due to increased weakness.  Patient noted to be in A. fib with RVR which is new onset was given IV Lopressor and subsequently ended up being placed on a Cardizem drip.  Cardiology consulted.   Assessment & Plan:   Principal Problem:   Atrial fibrillation with RVR (HCC) Active Problems:   Chronic diarrhea   Stroke ([place])   Hypertension   Type 2 diabetes mellitus with vascular disease (HCC)   HLD (hyperlipidemia)   Cerebrovascular accident (CVA) due to occlusion of right cerebellar artery (HCC)   Diarrhea   Rapid atrial fibrillation (HCC)   Iron deficiency anemia  1 new onset A. fib with RVR/ 2-2.5 sec pauses Patient noted on admission to have new onset rapid A. fib was given 2 doses of IV Lopressor however noted to go back into rapid A. fib and had to be placed on a Cardizem drip.  Denied any chest pain.  The echo done with a EF of 55-60%, no wall motion abnormalities, mild TR, moderate pulmonary hypertension.  TSH within normal limits at 1.543.  Cardiology has been consulted and metoprolol dose changed to 100 mg p.o. twice daily.  Cardizem drip was discontinued 01/02/2017, as patient's rate seemed to be controlled per cardiology.  Patient noted to have a prior history of CVA and due to high CHA2DS2VASC score of 8 is recommended that patient be on anticoagulation low-dose possible.  Patient currently on full dose Lovenox and no further bleeding.  After Cardizem drip was discontinued and patient placed on oral Cardizem patient noted to have heart rates increasing into the 130s.  Cardizem dose has been increased to 180 mg daily per cardiology as well as metoprolol dose increased 100 mg twice daily.  Saline lock IV fluids. Discontinued  Lovenox as patient has had no bleeding and started patient on Xarelto 15 mg daily per cardiology recommendations.  Heart rate was somewhat elevated 01/04/2017 and as such Cardizem CD has been increased to 180 mg daily per cardiology.  Patient noted now to be having some pauses on telemetry from 2-2.5-second pauses.  Heart rate better controlled.  Monitor closely on Cardizem 180 mg daily as well as Lopressor 100 mg twice daily.  Cardiology following and appreciate input and recommendations.  2.  Chronic persistent diarrhea Patient states he has had persistent diarrhea since cholecystectomy around when she was age 44.  Patient states she is followed up with GI in the outpatient setting however cannot remember what medications she was being given with her meals to help with her stools.  Patient states her stools are both loose and soft.  Consistency of stools improved with decreasing frequency on Questran.  C. difficile PCR negative.  GI pathogen panel negative. Continue supportive care.  Outpatient follow-up with GI.   3.  Iron deficiency anemia Hemoglobin currently stable at 10.4.  No signs of bleeding.  Anemia panel consistent with iron deficiency anemia. s/p IV Feraheme x1.  Likely benefit from oral iron supplementation on discharge.  Will start Nu-Iron 150 mg daily.  4.  Hypertension Blood pressure currently stable.  Metoprolol dose has been increased to 100 mg twice daily per cardiology.  Continue Cozaar.  Cardizem drip was discontinued and patient placed on oral Cardizem and dose adjusted.  Cardiology following.   5.  Well controlled diabetes mellitus type 2 Hemoglobin A1c was 6.7 on 04/04/2015.  Hemoglobin A1c 5.6 on 01/03/2017.  CBGs have ranged from 95-182.  Continue sliding scale insulin.    DVT prophylaxis: xarelto Code Status: DNR Family Communication: updated patient and family at bedside. Disposition Plan: Skilled nursing facility when medically stable and heart rate controlled, and per  cardiology..   Consultants:   Cardiology: Dr. Meda Coffee 01/01/2017  Procedures:   Acute abdominal series 12/31/2016  2D echo 12/31/2016  Antimicrobials:   None   Subjective: Patient up in bed.  Patient denies any chest pain.  No shortness of breath.  Weakness improving.  Patient wondering whether she will be discharged today.  Should also noted having some 2-2.5-second pauses on telemetry.  Objective: Vitals:   01/04/17 1516 01/04/17 2112 01/05/17 0500 01/05/17 1401  BP: 128/88 140/85 (!) 142/76 126/69  Pulse: 92 85 87 63  Resp: 20 20 20 16   Temp: 98.2 F (36.8 C) 97.8 F (36.6 C) 97.7 F (36.5 C) 97.8 F (36.6 C)  TempSrc: Oral Oral Oral Oral  SpO2: 96% 93% 98% 97%  Weight:      Height:        Intake/Output Summary (Last 24 hours) at 01/05/2017 1521 Last data filed at 01/05/2017 0903 Gross per 24 hour  Intake 300 ml  Output 400 ml  Net -100 ml   Filed Weights   12/31/16 1408  Weight: 72.6 kg (160 lb)    Examination:  General exam: NAD. Respiratory system: Clear to auscultation.  No wheezes, no crackles, no rhonchi.  Respiratory effort normal. Cardiovascular system: Irregularly irregular. No JVD, murmurs, rubs, gallops or clicks. No pedal edema. Gastrointestinal system: Abdomen is soft, nontender, nondistended, positive bowel sounds.  Central nervous system: Alert and oriented. No focal neurological deficits. Extremities: Symmetric 5 x 5 power. Skin: No rashes, lesions or ulcers Psychiatry: Judgement and insight appear normal. Mood & affect appropriate.     Data Reviewed: I have personally reviewed following labs and imaging studies  CBC: Recent Labs  Lab 12/31/16 1030 01/01/17 0501 01/02/17 0538 01/03/17 0516 01/04/17 0530 01/05/17 0511  WBC 6.8 7.4 6.2 5.0 5.9 6.4  NEUTROABS 5.2  --   --   --   --   --   HGB 10.7* 9.9* 9.6* 10.2* 10.1* 10.4*  HCT 35.6* 33.3* 32.2* 33.8* 33.5* 35.6*  MCV 103.8* 103.7* 102.9* 102.4* 103.4* 104.1*  PLT 179  157 154 158 179 478   Basic Metabolic Panel: Recent Labs  Lab 12/31/16 1030 01/01/17 0501 01/02/17 0538 01/03/17 0516 01/04/17 0530 01/05/17 0511  NA 144 144 139 141 141 143  K 3.5 3.8 3.4* 3.6 3.5 3.4*  CL 111 116* 111 116* 113* 117*  CO2 23 20* 19* 19* 18* 19*  GLUCOSE 135* 127* 131* 120* 106* 106*  BUN 21* 14 14 14 19 18   CREATININE 1.03* 1.03* 1.18* 1.25* 1.11* 1.06*  CALCIUM 9.0 8.6* 8.4* 8.8* 8.4* 8.8*  MG 1.8 1.7 2.4 2.1  --   --    GFR: Estimated Creatinine Clearance: 41.3 mL/min (A) (by C-G formula based on SCr of 1.06 mg/dL (H)). Liver Function Tests: Recent Labs  Lab 12/31/16 1030 01/01/17 0501  AST 23 20  ALT 54 45  ALKPHOS 152* 135*  BILITOT 0.9 0.5  PROT 6.6 6.0*  ALBUMIN 3.5 3.2*   Recent Labs  Lab 12/31/16 1030  LIPASE 21   No results for input(s): AMMONIA in the last  168 hours. Coagulation Profile: No results for input(s): INR, PROTIME in the last 168 hours. Cardiac Enzymes: No results for input(s): CKTOTAL, CKMB, CKMBINDEX, TROPONINI in the last 168 hours. BNP (last 3 results) No results for input(s): PROBNP in the last 8760 hours. HbA1C: Recent Labs    01/03/17 0516 01/04/17 0530  HGBA1C 5.6 5.3   CBG: Recent Labs  Lab 01/04/17 1135 01/04/17 1720 01/04/17 2108 01/05/17 0737 01/05/17 1141  GLUCAP 142* 111* 108* 117* 182*   Lipid Profile: No results for input(s): CHOL, HDL, LDLCALC, TRIG, CHOLHDL, LDLDIRECT in the last 72 hours. Thyroid Function Tests: No results for input(s): TSH, T4TOTAL, FREET4, T3FREE, THYROIDAB in the last 72 hours. Anemia Panel: No results for input(s): VITAMINB12, FOLATE, FERRITIN, TIBC, IRON, RETICCTPCT in the last 72 hours. Sepsis Labs: Recent Labs  Lab 12/31/16 1047  LATICACIDVEN 1.02    Recent Results (from the past 240 hour(s))  C difficile quick scan w PCR reflex     Status: None   Collection Time: 01/02/17 12:51 PM  Result Value Ref Range Status   C Diff antigen NEGATIVE NEGATIVE Final    C Diff toxin NEGATIVE NEGATIVE Final   C Diff interpretation No C. difficile detected.  Final  Gastrointestinal Panel by PCR , Stool     Status: None   Collection Time: 01/02/17 12:51 PM  Result Value Ref Range Status   Campylobacter species NOT DETECTED NOT DETECTED Final   Plesimonas shigelloides NOT DETECTED NOT DETECTED Final   Salmonella species NOT DETECTED NOT DETECTED Final   Yersinia enterocolitica NOT DETECTED NOT DETECTED Final   Vibrio species NOT DETECTED NOT DETECTED Final   Vibrio cholerae NOT DETECTED NOT DETECTED Final   Enteroaggregative E coli (EAEC) NOT DETECTED NOT DETECTED Final   Enteropathogenic E coli (EPEC) NOT DETECTED NOT DETECTED Final   Enterotoxigenic E coli (ETEC) NOT DETECTED NOT DETECTED Final   Shiga like toxin producing E coli (STEC) NOT DETECTED NOT DETECTED Final   Shigella/Enteroinvasive E coli (EIEC) NOT DETECTED NOT DETECTED Final   Cryptosporidium NOT DETECTED NOT DETECTED Final   Cyclospora cayetanensis NOT DETECTED NOT DETECTED Final   Entamoeba histolytica NOT DETECTED NOT DETECTED Final   Giardia lamblia NOT DETECTED NOT DETECTED Final   Adenovirus F40/41 NOT DETECTED NOT DETECTED Final   Astrovirus NOT DETECTED NOT DETECTED Final   Norovirus GI/GII NOT DETECTED NOT DETECTED Final   Rotavirus A NOT DETECTED NOT DETECTED Final   Sapovirus (I, II, IV, and V) NOT DETECTED NOT DETECTED Final         Radiology Studies: Dg Chest 2 View  Result Date: 01/03/2017 CLINICAL DATA:  Atrial fibrillation.  History of breast cancer. EXAM: CHEST  2 VIEW COMPARISON:  12/31/2016 FINDINGS: Increased densities at both lung bases and costophrenic angles. Findings are suggestive for pleural effusions. There is a very large hiatal hernia containing gas. Heart size is upper limits of normal but unchanged. Surgical clips in the left axilla. Upper lungs are clear without pulmonary edema. IMPRESSION: Basilar chest densities are suggestive for small bilateral  pleural effusions. Large hiatal hernia. Electronically Signed   By: Markus Daft M.D.   On: 01/03/2017 16:35        Scheduled Meds: . chlorhexidine  15 mL Mouth Rinse BID  . cholestyramine  4 g Oral TID  . diltiazem  180 mg Oral Daily  . insulin aspart  0-15 Units Subcutaneous TID WC  . insulin aspart  0-5 Units Subcutaneous QHS  . loratadine  10 mg Oral Daily  . losartan  50 mg Oral Daily  . mouth rinse  15 mL Mouth Rinse q12n4p  . metoprolol tartrate  100 mg Oral BID  . pantoprazole  40 mg Oral Q0600  . rivaroxaban   Does not apply Once  . rivaroxaban  15 mg Oral Q supper   Continuous Infusions:    LOS: 5 days    Time spent: 35 mins    Irine Seal, MD [facility] Pager [phone]  If 7PM-7AM, please contact night-coverage [email or web address] Password [email or web address] 01/05/2017, 3:21 PM

## 2017-01-06 ENCOUNTER — Encounter (INDEPENDENT_AMBULATORY_CARE_PROVIDER_SITE_OTHER): Payer: Medicare Other | Admitting: Physical Medicine and Rehabilitation

## 2017-01-06 ENCOUNTER — Inpatient Hospital Stay (HOSPITAL_COMMUNITY): Payer: Medicare Other

## 2017-01-06 DIAGNOSIS — R1310 Dysphagia, unspecified: Secondary | ICD-10-CM

## 2017-01-06 LAB — CBC
HCT: 34.5 % — ABNORMAL LOW (ref 36.0–46.0)
Hemoglobin: 10.2 g/dL — ABNORMAL LOW (ref 12.0–15.0)
MCH: 30.5 pg (ref 26.0–34.0)
MCHC: 29.6 g/dL — ABNORMAL LOW (ref 30.0–36.0)
MCV: 103.3 fL — ABNORMAL HIGH (ref 78.0–100.0)
PLATELETS: 188 10*3/uL (ref 150–400)
RBC: 3.34 MIL/uL — AB (ref 3.87–5.11)
RDW: 13.8 % (ref 11.5–15.5)
WBC: 6.6 10*3/uL (ref 4.0–10.5)

## 2017-01-06 LAB — GLUCOSE, CAPILLARY
GLUCOSE-CAPILLARY: 131 mg/dL — AB (ref 65–99)
GLUCOSE-CAPILLARY: 102 mg/dL — AB (ref 65–99)
Glucose-Capillary: 134 mg/dL — ABNORMAL HIGH (ref 65–99)
Glucose-Capillary: 141 mg/dL — ABNORMAL HIGH (ref 65–99)

## 2017-01-06 LAB — BASIC METABOLIC PANEL
ANION GAP: 8 (ref 5–15)
BUN: 22 mg/dL — ABNORMAL HIGH (ref 6–20)
CALCIUM: 8.8 mg/dL — AB (ref 8.9–10.3)
CO2: 18 mmol/L — ABNORMAL LOW (ref 22–32)
Chloride: 115 mmol/L — ABNORMAL HIGH (ref 101–111)
Creatinine, Ser: 1.22 mg/dL — ABNORMAL HIGH (ref 0.44–1.00)
GFR, EST AFRICAN AMERICAN: 47 mL/min — AB (ref >60)
GFR, EST NON AFRICAN AMERICAN: 40 mL/min — AB (ref >60)
Glucose, Bld: 132 mg/dL — ABNORMAL HIGH (ref 65–99)
POTASSIUM: 3.8 mmol/L (ref 3.5–5.1)
SODIUM: 141 mmol/L (ref 135–145)

## 2017-01-06 MED ORDER — DILTIAZEM LOAD VIA INFUSION
10.0000 mg | Freq: Once | INTRAVENOUS | Status: AC
  Administered 2017-01-06: 10 mg via INTRAVENOUS
  Filled 2017-01-06: qty 10

## 2017-01-06 MED ORDER — PANTOPRAZOLE SODIUM 40 MG PO TBEC
40.0000 mg | DELAYED_RELEASE_TABLET | Freq: Two times a day (BID) | ORAL | Status: DC
  Administered 2017-01-06 – 2017-01-09 (×6): 40 mg via ORAL
  Filled 2017-01-06 (×6): qty 1

## 2017-01-06 MED ORDER — DILTIAZEM HCL 100 MG IV SOLR
5.0000 mg/h | INTRAVENOUS | Status: DC
  Administered 2017-01-06 – 2017-01-07 (×2): 5 mg/h via INTRAVENOUS
  Filled 2017-01-06 (×2): qty 100

## 2017-01-06 NOTE — Progress Notes (Signed)
PROGRESS NOTE    Kendra Maldonado  QVZ:[id number] DOB: 1933-11-02 DOA: 12/31/2016 PCP: Lavone Orn, MD    Brief Narrative:  83 year old female history of prior CVA, diabetes, hypertension, history of hernia, chronic diarrhea brought to the ED by family members due to increased weakness.  Patient noted to be in A. fib with RVR which is new onset was given IV Lopressor and subsequently ended up being placed on a Cardizem drip.  Cardiology consulted.  Patient also with complaints of dysphagia.  GI consulted.   Assessment & Plan:   Principal Problem:   Atrial fibrillation with RVR (HCC) Active Problems:   Chronic diarrhea   Stroke ([place])   Hypertension   Type 2 diabetes mellitus with vascular disease (HCC)   HLD (hyperlipidemia)   Cerebrovascular accident (CVA) due to occlusion of right cerebellar artery (HCC)   Diarrhea   Rapid atrial fibrillation (HCC)   Iron deficiency anemia  1 new onset A. fib with RVR/ 2-2.5 sec pauses Patient noted on admission to have new onset rapid A. fib was given 2 doses of IV Lopressor however noted to go back into rapid A. fib and had to be placed on a Cardizem drip.  Denied any chest pain.  The echo done with a EF of 55-60%, no wall motion abnormalities, mild TR, moderate pulmonary hypertension.  TSH within normal limits at 1.543.  Cardiology has been consulted and metoprolol dose changed to 100 mg p.o. twice daily.  Cardizem drip was discontinued 01/02/2017, as patient's rate seemed to be controlled per cardiology.  Patient noted to have a prior history of CVA and due to high CHA2DS2VASC score of 8 is recommended that patient be on anticoagulation low-dose possible.  Patient currently on full dose Lovenox and no further bleeding.  After Cardizem drip was discontinued and patient placed on oral Cardizem patient noted to have heart rates increasing into the 130s.  Cardizem dose has been increased to 180 mg daily per cardiology as well as metoprolol dose increased  100 mg twice daily.  Saline lock IV fluids. Discontinued Lovenox as patient has had no bleeding and started patient on Xarelto 15 mg daily per cardiology recommendations.  Heart rate was somewhat elevated 01/04/2017 and as such Cardizem CD was increased to 180 mg daily per cardiology.  Patient noted now to be having some pauses on telemetry from 2-2.5-second pauses.  Heart rate better controlled.  Patient noted to have heart rates in the 130s this morning prior to her medications.  Patient seen by cardiology and patient placed back on a Cardizem drip.  Patient on Lopressor. Per Cardiology.    2.  Chronic persistent diarrhea Patient states he has had persistent diarrhea since cholecystectomy around when she was age 70.  Patient states she is followed up with GI in the outpatient setting however cannot remember what medications she was being given with her meals to help with her stools.  Patient states her stools are both loose and soft.  Consistency of stools improved with decreasing frequency on Questran.  C. difficile PCR negative.  GI pathogen panel negative. Continue supportive care.  Outpatient follow-up with GI.  3.  Dysphagia Patient with complaints of dysphasia states she does have troubling swallowing food and feels the food gets stuck.  Patient with history of hiatal hernia.  Patient on a PPI.  Will consult with GI for further evaluation and management.  4.  Iron deficiency anemia Hemoglobin currently stable at 10.2.  No signs of bleeding.  Anemia  panel consistent with iron deficiency anemia. s/p IV Feraheme x1.  Likely benefit from oral iron supplementation on discharge. Continue Nu-Iron 150 mg daily.  5.  Hypertension Blood pressure currently stable.  Metoprolol dose has been increased to 100 mg twice daily per cardiology.  Continue Cozaar.  Cardizem drip was discontinued and patient placed on oral Cardizem and dose adjusted.  Due to heart rate in the 130s this morning patient has been placed  back on Cardizem drip per cardiology.  Cardiology following.   6.  Well controlled diabetes mellitus type 2 Hemoglobin A1c was 6.7 on 04/04/2015.  Hemoglobin A1c 5.6 on 01/03/2017.  CBGs have ranged from 102-134.  Continue sliding scale insulin.    DVT prophylaxis: xarelto Code Status: DNR Family Communication: updated patient and family at bedside. Disposition Plan: Skilled nursing facility when medically stable and heart rate controlled, and per cardiology..   Consultants:   Cardiology: Dr. Meda Coffee 01/01/2017 Gastroenterology pending  Procedures:   Acute abdominal series 12/31/2016  2D echo 12/31/2016  Antimicrobials:   None   Subjective: Patient sitting up in bed with complaints of dysphasia.  Patient states threw up her breakfast and pills this morning was unable to keep them down.  Patient complaining of difficulty swallowing states she feels like the food gets stuck.  Patient also noted with heart rates in the 130s.  Patient noted to have 2-2.5-second pauses on telemetry on 01/05/2017.   Objective: Vitals:   01/05/17 0500 01/05/17 1401 01/05/17 2111 01/06/17 0513  BP: (!) 142/76 126/69 (!) 122/54 122/65  Pulse: 87 63 65 65  Resp: 20 16 18 18   Temp: 97.7 F (36.5 C) 97.8 F (36.6 C) (!) 97.5 F (36.4 C) (!) 97.5 F (36.4 C)  TempSrc: Oral Oral Oral Oral  SpO2: 98% 97% 98% 98%  Weight:      Height:        Intake/Output Summary (Last 24 hours) at 01/06/2017 1111 Last data filed at 01/05/2017 1700 Gross per 24 hour  Intake 240 ml  Output -  Net 240 ml   Filed Weights   12/31/16 1408  Weight: 72.6 kg (160 lb)    Examination:  General exam: NAD. Respiratory system: Clear to auscultation bilaterally.  No crackles, no wheezes, no rhonchi.  Respiratory effort normal. Cardiovascular system: Irregularly irregular. No JVD, murmurs, rubs, gallops or clicks. No pedal edema. Gastrointestinal system: Abdomen is soft, nontender, nondistended, positive bowel  sounds.  Central nervous system: Alert and oriented. No focal neurological deficits. Extremities: Symmetric 5 x 5 power. Skin: No rashes, lesions or ulcers Psychiatry: Judgement and insight appear normal. Mood & affect appropriate.     Data Reviewed: I have personally reviewed following labs and imaging studies  CBC: Recent Labs  Lab 12/31/16 1030  01/02/17 0538 01/03/17 0516 01/04/17 0530 01/05/17 0511 01/06/17 0441  WBC 6.8   < > 6.2 5.0 5.9 6.4 6.6  NEUTROABS 5.2  --   --   --   --   --   --   HGB 10.7*   < > 9.6* 10.2* 10.1* 10.4* 10.2*  HCT 35.6*   < > 32.2* 33.8* 33.5* 35.6* 34.5*  MCV 103.8*   < > 102.9* 102.4* 103.4* 104.1* 103.3*  PLT 179   < > 154 158 179 197 188   < > = values in this interval not displayed.   Basic Metabolic Panel: Recent Labs  Lab 12/31/16 1030 01/01/17 0501 01/02/17 9735 01/03/17 0516 01/04/17 0530 01/05/17 0511 01/06/17 0441  NA 144 144 139 141 141 143 141  K 3.5 3.8 3.4* 3.6 3.5 3.4* 3.8  CL 111 116* 111 116* 113* 117* 115*  CO2 23 20* 19* 19* 18* 19* 18*  GLUCOSE 135* 127* 131* 120* 106* 106* 132*  BUN 21* 14 14 14 19 18  22*  CREATININE 1.03* 1.03* 1.18* 1.25* 1.11* 1.06* 1.22*  CALCIUM 9.0 8.6* 8.4* 8.8* 8.4* 8.8* 8.8*  MG 1.8 1.7 2.4 2.1  --   --   --    GFR: Estimated Creatinine Clearance: 35.9 mL/min (A) (by C-G formula based on SCr of 1.22 mg/dL (H)). Liver Function Tests: Recent Labs  Lab 12/31/16 1030 01/01/17 0501  AST 23 20  ALT 54 45  ALKPHOS 152* 135*  BILITOT 0.9 0.5  PROT 6.6 6.0*  ALBUMIN 3.5 3.2*   Recent Labs  Lab 12/31/16 1030  LIPASE 21   No results for input(s): AMMONIA in the last 168 hours. Coagulation Profile: No results for input(s): INR, PROTIME in the last 168 hours. Cardiac Enzymes: No results for input(s): CKTOTAL, CKMB, CKMBINDEX, TROPONINI in the last 168 hours. BNP (last 3 results) No results for input(s): PROBNP in the last 8760 hours. HbA1C: Recent Labs    01/04/17 0530    HGBA1C 5.3   CBG: Recent Labs  Lab 01/05/17 0737 01/05/17 1141 01/05/17 1636 01/05/17 2153 01/06/17 0757  GLUCAP 117* 182* 95 132* 131*   Lipid Profile: No results for input(s): CHOL, HDL, LDLCALC, TRIG, CHOLHDL, LDLDIRECT in the last 72 hours. Thyroid Function Tests: No results for input(s): TSH, T4TOTAL, FREET4, T3FREE, THYROIDAB in the last 72 hours. Anemia Panel: No results for input(s): VITAMINB12, FOLATE, FERRITIN, TIBC, IRON, RETICCTPCT in the last 72 hours. Sepsis Labs: Recent Labs  Lab 12/31/16 1047  LATICACIDVEN 1.02    Recent Results (from the past 240 hour(s))  C difficile quick scan w PCR reflex     Status: None   Collection Time: 01/02/17 12:51 PM  Result Value Ref Range Status   C Diff antigen NEGATIVE NEGATIVE Final   C Diff toxin NEGATIVE NEGATIVE Final   C Diff interpretation No C. difficile detected.  Final  Gastrointestinal Panel by PCR , Stool     Status: None   Collection Time: 01/02/17 12:51 PM  Result Value Ref Range Status   Campylobacter species NOT DETECTED NOT DETECTED Final   Plesimonas shigelloides NOT DETECTED NOT DETECTED Final   Salmonella species NOT DETECTED NOT DETECTED Final   Yersinia enterocolitica NOT DETECTED NOT DETECTED Final   Vibrio species NOT DETECTED NOT DETECTED Final   Vibrio cholerae NOT DETECTED NOT DETECTED Final   Enteroaggregative E coli (EAEC) NOT DETECTED NOT DETECTED Final   Enteropathogenic E coli (EPEC) NOT DETECTED NOT DETECTED Final   Enterotoxigenic E coli (ETEC) NOT DETECTED NOT DETECTED Final   Shiga like toxin producing E coli (STEC) NOT DETECTED NOT DETECTED Final   Shigella/Enteroinvasive E coli (EIEC) NOT DETECTED NOT DETECTED Final   Cryptosporidium NOT DETECTED NOT DETECTED Final   Cyclospora cayetanensis NOT DETECTED NOT DETECTED Final   Entamoeba histolytica NOT DETECTED NOT DETECTED Final   Giardia lamblia NOT DETECTED NOT DETECTED Final   Adenovirus F40/41 NOT DETECTED NOT DETECTED Final    Astrovirus NOT DETECTED NOT DETECTED Final   Norovirus GI/GII NOT DETECTED NOT DETECTED Final   Rotavirus A NOT DETECTED NOT DETECTED Final   Sapovirus (I, II, IV, and V) NOT DETECTED NOT DETECTED Final  Radiology Studies: No results found.      Scheduled Meds: . chlorhexidine  15 mL Mouth Rinse BID  . cholestyramine  4 g Oral TID  . dextromethorphan-guaiFENesin  1 tablet Oral BID  . diltiazem  10 mg Intravenous Once  . insulin aspart  0-15 Units Subcutaneous TID WC  . insulin aspart  0-5 Units Subcutaneous QHS  . iron polysaccharides  150 mg Oral Daily  . loratadine  10 mg Oral Daily  . losartan  50 mg Oral Daily  . mouth rinse  15 mL Mouth Rinse q12n4p  . metoprolol tartrate  100 mg Oral BID  . pantoprazole  40 mg Oral Q0600  . rivaroxaban   Does not apply Once  . rivaroxaban  15 mg Oral Q supper   Continuous Infusions: . diltiazem (CARDIZEM) infusion       LOS: 6 days    Time spent: 35 mins    Irine Seal, MD [facility] Pager [phone]  If 7PM-7AM, please contact night-coverage [email or web address] Password [email or web address] 01/06/2017, 11:11 AM

## 2017-01-06 NOTE — Progress Notes (Addendum)
Progress Note  Patient Name: Kendra Maldonado Date of Encounter: 01/06/2017  Primary Cardiologist: Dr. Ena Dawley  Subjective   Denies any chest pain, SOB or palpitations.  Had several 2 second pauses on tele but otherwise asymptomatic.  HR has been int he 130's all morning and then around 9am felt nauseated and vomited.  Inpatient Medications    Scheduled Meds: . chlorhexidine  15 mL Mouth Rinse BID  . cholestyramine  4 g Oral TID  . dextromethorphan-guaiFENesin  1 tablet Oral BID  . diltiazem  180 mg Oral Daily  . insulin aspart  0-15 Units Subcutaneous TID WC  . insulin aspart  0-5 Units Subcutaneous QHS  . iron polysaccharides  150 mg Oral Daily  . loratadine  10 mg Oral Daily  . losartan  50 mg Oral Daily  . mouth rinse  15 mL Mouth Rinse q12n4p  . metoprolol tartrate  100 mg Oral BID  . pantoprazole  40 mg Oral Q0600  . rivaroxaban   Does not apply Once  . rivaroxaban  15 mg Oral Q supper   Continuous Infusions:  PRN Meds: diphenoxylate-atropine, labetalol, LORazepam, menthol-cetylpyridinium   Vital Signs    Vitals:   01/05/17 0500 01/05/17 1401 01/05/17 2111 01/06/17 0513  BP: (!) 142/76 126/69 (!) 122/54 122/65  Pulse: 87 63 65 65  Resp: 20 16 18 18   Temp: 97.7 F (36.5 C) 97.8 F (36.6 C) (!) 97.5 F (36.4 C) (!) 97.5 F (36.4 C)  TempSrc: Oral Oral Oral Oral  SpO2: 98% 97% 98% 98%  Weight:      Height:        Intake/Output Summary (Last 24 hours) at 01/06/2017 0948 Last data filed at 01/05/2017 1700 Gross per 24 hour  Intake 240 ml  Output -  Net 240 ml   Filed Weights   12/31/16 1408  Weight: 160 lb (72.6 kg)    Telemetry    Atrial fibrillation with RVR in the 130's - Personally Reviewed  ECG    No new EKG to review - Personally Reviewed  Physical Exam   GEN: No acute distress.   Neck: No JVD Cardiac: irreguarly irregular and tachy, no murmurs, rubs, or gallops.  Respiratory: Clear to auscultation bilaterally. GI: Soft,  nontender, non-distended  MS: No edema; No deformity. Neuro:  Nonfocal  Psych: Normal affect   Labs    Chemistry Recent Labs  Lab 12/31/16 1030 01/01/17 0501  01/04/17 0530 01/05/17 0511 01/06/17 0441  NA 144 144   < > 141 143 141  K 3.5 3.8   < > 3.5 3.4* 3.8  CL 111 116*   < > 113* 117* 115*  CO2 23 20*   < > 18* 19* 18*  GLUCOSE 135* 127*   < > 106* 106* 132*  BUN 21* 14   < > 19 18 22*  CREATININE 1.03* 1.03*   < > 1.11* 1.06* 1.22*  CALCIUM 9.0 8.6*   < > 8.4* 8.8* 8.8*  PROT 6.6 6.0*  --   --   --   --   ALBUMIN 3.5 3.2*  --   --   --   --   AST 23 20  --   --   --   --   ALT 54 45  --   --   --   --   ALKPHOS 152* 135*  --   --   --   --   BILITOT 0.9 0.5  --   --   --   --  GFRNONAA 50* 50*   < > 45* 48* 40*  GFRAA 57* 57*   < > 53* 56* 47*  ANIONGAP 10 8   < > 10 7 8    < > = values in this interval not displayed.     Hematology Recent Labs  Lab 01/04/17 0530 01/05/17 0511 01/06/17 0441  WBC 5.9 6.4 6.6  RBC 3.24* 3.42* 3.34*  HGB 10.1* 10.4* 10.2*  HCT 33.5* 35.6* 34.5*  MCV 103.4* 104.1* 103.3*  MCH 31.2 30.4 30.5  MCHC 30.1 29.2* 29.6*  RDW 13.4 13.8 13.8  PLT 179 197 188    Cardiac EnzymesNo results for input(s): TROPONINI in the last 168 hours.  Recent Labs  Lab 12/31/16 1046  TROPIPOC 0.00     BNPNo results for input(s): BNP, PROBNP in the last 168 hours.   DDimer No results for input(s): DDIMER in the last 168 hours.   Radiology    No results found.  Cardiac Studies   2D Echo 12/31/16 Study Conclusions - Left ventricle: The cavity size was normal. Wall thickness was normal. Systolic function was normal. The estimated ejection fraction was in the range of 55% to 60%. Wall motion was normal; there were no regional wall motion abnormalities. - Mitral valve: Calcified annulus. - Pulmonary arteries: Systolic pressure was moderately increased. PA peak pressure: 53 mm Hg (S). - Pericardium, extracardiac: A trivial  pericardial effusion was identified. Impressions: - Normal LV function; mild TR; moderate pulmonary hypertension.   Patient Profile     83 y.o. female withCVAin setting ofcerebrovascular disease2017, DM, HTN, hiatal hernia, chronic diarrhea, breast CA, spinal stenosis, chronic Paget's disease of the pelvis, upper GIB with symptomatic anemia 11/2013,worsening anemia 01/2016 withsuspected MGUS, lung nodule, probable CKD II-IIIwhowas admitted with increased weakness and inability to care for self x 1 month, also persistent daily diarrhea and decreased PO intake. She was found to have new onset atrial fib RVR.  Assessment & Plan    1.Persistent diarrhea -may be contributing to her weakness, given her poor oral intake over the last month. - per TR[facility]  2. Newly recognized atrial fib RVR -patient is generally unaware of this but it is also likely contributing to her weakness. CHADSVASC 8 for HTN, age, stroke, cerebrovascular disease, and female. With her history of significant GI bleeding and anemia further complicated by MGUS, it is not clear that she is ideal candidate for anticoagulation.  - 2D echo with normal LVF.  - tolerated Lovenox so now on Xarelto 15mg  daily.  - rate better controlled now on Metoprolol 100mg  BID and Cardizem CD 120mg  daily but still having some elevations in HR and this am has been in the 130's most of the am.  Had nausea earlier this am and vomited but had not gotten cardiac meds. I think she needs to hold off on SNF until HR control improved. - she had a few 2 seconds pauses on tele but asymptomatic.  Continue current meds.  - change PO Cardizem to IV Cardizem gtt with 10mg  IV bolus and start gtt at 5mg /hr and then titrate as needed to keep HR < 100  3. Flucutating anemia- per primary team, likely multifactorial picture.   4. Hypokalemia -IM managing, improved and 3.8 today. Mag 2.1.    For questions or updates, please contact [place] Please  consult [email or web address] for contact info under Cardiology/STEMI.      Signed, Fransico Him, MD  01/06/2017, 9:48 AM

## 2017-01-06 NOTE — Consult Note (Signed)
[facility] CONSULT Reason for consult: Dysphagia Referring Physician: [facility] hospitalist.  PCP: Dr. Lavone Orn.  Primary GI: Dr. Wonda Cerise Kendra Maldonado is an 83 y.o. female.  HPI: She has been seen for some time by Dr. Watt Climes.  She has chronic diarrhea and has been worked up for this in the past.  In 2016 she had colonoscopy because of her chronic diarrhea and iron deficiency anemia.  This was negative.  She had colonic biopsies that were negative for microscopic colitis.  She has had chronic diarrhea since approximately age 60 when she had her gallbladder removed.  This is been diagnosed as postcholecystectomy diarrhea.  She has been on empiric cholestyramine and Colestid in the past without much in way of improvement.  She also had EGD at that time because of this chronic iron deficiency anemia and was noted to have a very tortuous esophagus but no stricture.  She had gastric polyps that were biopsied and were fundic gland polyps.  She had a very large hiatal hernia with Lysbeth Galas ulcers.  It was felt that the large hiatal hernia with the chronic ulcerations were likely the cause of her chronic iron deficiency. The patient was admitted 12/11 with complaints of weakness.  The patient had no prior cardiac history and was found in the emergency room to be in atrial fibrillation.  This was a new diagnosis.  This is been treated and she has been followed by cardiology.  She is now on oral diltiazem and Xarelto.  We are asked to see her back because she has had dysphagia here in the hospital.  A long discussion with the patient about her ability to eat and swallow.  She does take care at home to chew her food well drink liquids with meals etc. and does not normally have much trouble swallowing unless she eats too fast.  She feels that the medications that she has been taking have made things worse in the hospital and is only been in the past couple of days that she has had problems.  She now is back on  a liquid diet.  She states breakfast today was particularly troublesome and she wonders if the cardiac medicines may be causing some of this problem. Her other problems include chronic back pain.  She has had left breast cancer and has had a prior lumpectomy.  She does have spinal stenosis felt to be the cause of her back pain.  She has type 2 diabetes.  She has not had any cardiac history until recently.  She has a history of mild stroke in the past.  Her surgeries include cholecystectomy, hemorrhoid surgery hysterectomy and breast surgery.   Past Medical History:  Diagnosis Date  . Back pain   . Breast cancer, left breast ([place]) 2010   lumpectomy  . Cerebrovascular disease    a. 03/2015 - high grade proximal right posterior inferior cerebellar artery stenosis seen on MRA corresponding with distribution of stroke at that time.  . Chronic diarrhea   . CKD (chronic kidney disease), stage II   . Diabetes mellitus type 2, uncontrolled ([place])   . Essential hypertension   . Hiatal hernia    Stenosis of the spine  . Iron deficiency anemia 01/02/2017  . Lung nodule   . MGUS (monoclonal gammopathy of unknown significance)   . Paget's disease of bone   . Spinal stenosis   . Stroke ([place]) 03/2015  . Symptomatic anemia   . Upper GI bleed 11/2013  Past Surgical History:  Procedure Laterality Date  . BREAST SURGERY    . CHOLECYSTECTOMY    . ESOPHAGOGASTRODUODENOSCOPY (EGD) WITH PROPOFOL N/A 12/10/2013   Procedure: ESOPHAGOGASTRODUODENOSCOPY (EGD) WITH PROPOFOL;  Surgeon: Jeryl Columbia, MD;  Location: [facility] ENDOSCOPY;  Service: Endoscopy;  Laterality: N/A;  . HEMORRHOID SURGERY    . PARTIAL HYSTERECTOMY      Family History  Problem Relation Age of Onset  . Hypertension Mother   . Dementia Father   . Cancer Brother        pancreas    Social History:  reports that  has never smoked. she has never used smokeless tobacco. She reports that she does not drink alcohol or use drugs.  Allergies:   Allergies  Allergen Reactions  . Tramadol     DIZZY, "WENT OUT"  . Codeine Rash  . Demerol [Meperidine] Rash    Medications; Prior to Admission medications   Medication Sig Start Date End Date Taking? Authorizing Provider  acetaminophen (TYLENOL) 500 MG tablet Take 1,000 mg by mouth every 6 (six) hours as needed for mild pain.   Yes [provider]  aspirin EC 81 MG EC tablet Take 1 tablet (81 mg total) by mouth daily. 04/06/15  Yes Hosie Poisson, MD  Cholecalciferol (D 2000) 2000 UNITS TABS Take 2,000 Units by mouth daily.   Yes [provider]  diclofenac sodium (VOLTAREN) 1 % GEL Apply 1 application topically 3 (three) times daily as needed for pain. 12/25/16  Yes [provider]  diphenoxylate-atropine (LOMOTIL) 2.5-0.025 MG per tablet Take 2 tablets by mouth daily as needed for diarrhea or loose stools.  09/20/13  Yes [provider]  ferrous sulfate 325 (65 FE) MG tablet Take 1 tablet (325 mg total) by mouth daily with breakfast. Patient taking differently: Take 325 mg by mouth 2 (two) times daily with a meal.  12/11/13  Yes Sid Falcon, MD  LORazepam (ATIVAN) 0.5 MG tablet Take 0.5 mg by mouth 2 (two) times daily as needed for anxiety. 12/28/16  Yes [provider]  losartan (COZAAR) 100 MG tablet Take 100 mg by mouth.   Yes [provider]  metFORMIN (GLUCOPHAGE) 500 MG tablet Take 500 mg by mouth 2 (two) times daily. 10/07/13  Yes [provider]  omeprazole (PRILOSEC) 20 MG capsule Take 20 mg by mouth daily.   Yes [provider]  traMADol (ULTRAM) 50 MG tablet Take 1 tablet (50 mg total) by mouth every 6 (six) hours as needed. 12/16/16  Yes Virgel Manifold, MD  atorvastatin (LIPITOR) 40 MG tablet Take 1 tablet (40 mg total) by mouth daily at 6 PM. Patient not taking: Reported on 12/31/2016 04/06/15   Hosie Poisson, MD  cephALEXin (KEFLEX) 500 MG capsule Take 1 capsule (500 mg total) by mouth 3 (three) times  daily. Patient not taking: Reported on 12/31/2016 12/16/16   Virgel Manifold, MD  clopidogrel (PLAVIX) 75 MG tablet Take 1 tablet (75 mg total) by mouth daily. Patient not taking: Reported on 12/23/2016 04/06/15   Hosie Poisson, MD  metoprolol tartrate (LOPRESSOR) 25 MG tablet Take 0.5 tablets (12.5 mg total) by mouth 2 (two) times daily. Patient not taking: Reported on 12/31/2016 04/06/15   Hosie Poisson, MD   . chlorhexidine  15 mL Mouth Rinse BID  . cholestyramine  4 g Oral TID  . dextromethorphan-guaiFENesin  1 tablet Oral BID  . insulin aspart  0-15 Units Subcutaneous TID WC  . insulin aspart  0-5 Units Subcutaneous  QHS  . iron polysaccharides  150 mg Oral Daily  . loratadine  10 mg Oral Daily  . losartan  50 mg Oral Daily  . mouth rinse  15 mL Mouth Rinse q12n4p  . metoprolol tartrate  100 mg Oral BID  . pantoprazole  40 mg Oral Q0600  . rivaroxaban   Does not apply Once  . rivaroxaban  15 mg Oral Q supper   PRN Meds diphenoxylate-atropine, labetalol, LORazepam, menthol-cetylpyridinium Results for orders placed or performed during the hospital encounter of 12/31/16 (from the past 48 hour(s))  Glucose, capillary     Status: Abnormal   Collection Time: 01/04/17  5:20 PM  Result Value Ref Range   Glucose-Capillary 111 (H) 65 - 99 mg/dL  Glucose, capillary     Status: Abnormal   Collection Time: 01/04/17  9:08 PM  Result Value Ref Range   Glucose-Capillary 108 (H) 65 - 99 mg/dL  Basic metabolic panel     Status: Abnormal   Collection Time: 01/05/17  5:11 AM  Result Value Ref Range   Sodium 143 135 - 145 mmol/L   Potassium 3.4 (L) 3.5 - 5.1 mmol/L   Chloride 117 (H) 101 - 111 mmol/L   CO2 19 (L) 22 - 32 mmol/L   Glucose, Bld 106 (H) 65 - 99 mg/dL   BUN 18 6 - 20 mg/dL   Creatinine, Ser 1.06 (H) 0.44 - 1.00 mg/dL   Calcium 8.8 (L) 8.9 - 10.3 mg/dL   GFR calc non Af Amer 48 (L) >60 mL/min   GFR calc Af Amer 56 (L) >60 mL/min    Comment: (NOTE) The eGFR has been calculated  using the CKD EPI equation. This calculation has not been validated in all clinical situations. eGFR's persistently <60 mL/min signify possible Chronic Kidney Disease.    Anion gap 7 5 - 15  CBC     Status: Abnormal   Collection Time: 01/05/17  5:11 AM  Result Value Ref Range   WBC 6.4 4.0 - 10.5 K/uL   RBC 3.42 (L) 3.87 - 5.11 MIL/uL   Hemoglobin 10.4 (L) 12.0 - 15.0 g/dL   HCT 35.6 (L) 36.0 - 46.0 %   MCV 104.1 (H) 78.0 - 100.0 fL   MCH 30.4 26.0 - 34.0 pg   MCHC 29.2 (L) 30.0 - 36.0 g/dL   RDW 13.8 11.5 - 15.5 %   Platelets 197 150 - 400 K/uL  Glucose, capillary     Status: Abnormal   Collection Time: 01/05/17  7:37 AM  Result Value Ref Range   Glucose-Capillary 117 (H) 65 - 99 mg/dL  Glucose, capillary     Status: Abnormal   Collection Time: 01/05/17 11:41 AM  Result Value Ref Range   Glucose-Capillary 182 (H) 65 - 99 mg/dL  Glucose, capillary     Status: None   Collection Time: 01/05/17  4:36 PM  Result Value Ref Range   Glucose-Capillary 95 65 - 99 mg/dL  Glucose, capillary     Status: Abnormal   Collection Time: 01/05/17  9:53 PM  Result Value Ref Range   Glucose-Capillary 132 (H) 65 - 99 mg/dL  CBC     Status: Abnormal   Collection Time: 01/06/17  4:41 AM  Result Value Ref Range   WBC 6.6 4.0 - 10.5 K/uL   RBC 3.34 (L) 3.87 - 5.11 MIL/uL   Hemoglobin 10.2 (L) 12.0 - 15.0 g/dL   HCT 34.5 (L) 36.0 - 46.0 %   MCV 103.3 (H) 78.0 -  100.0 fL   MCH 30.5 26.0 - 34.0 pg   MCHC 29.6 (L) 30.0 - 36.0 g/dL   RDW 13.8 11.5 - 15.5 %   Platelets 188 150 - 400 K/uL  Basic metabolic panel     Status: Abnormal   Collection Time: 01/06/17  4:41 AM  Result Value Ref Range   Sodium 141 135 - 145 mmol/L   Potassium 3.8 3.5 - 5.1 mmol/L   Chloride 115 (H) 101 - 111 mmol/L   CO2 18 (L) 22 - 32 mmol/L   Glucose, Bld 132 (H) 65 - 99 mg/dL   BUN 22 (H) 6 - 20 mg/dL   Creatinine, Ser 1.22 (H) 0.44 - 1.00 mg/dL   Calcium 8.8 (L) 8.9 - 10.3 mg/dL   GFR calc non Af Amer 40 (L) >60  mL/min   GFR calc Af Amer 47 (L) >60 mL/min    Comment: (NOTE) The eGFR has been calculated using the CKD EPI equation. This calculation has not been validated in all clinical situations. eGFR's persistently <60 mL/min signify possible Chronic Kidney Disease.    Anion gap 8 5 - 15  Glucose, capillary     Status: Abnormal   Collection Time: 01/06/17  7:57 AM  Result Value Ref Range   Glucose-Capillary 131 (H) 65 - 99 mg/dL  Glucose, capillary     Status: Abnormal   Collection Time: 01/06/17 11:32 AM  Result Value Ref Range   Glucose-Capillary 134 (H) 65 - 99 mg/dL    No results found.             Blood pressure 131/83, pulse (!) 102, temperature (!) 97.5 F (36.4 C), temperature source Oral, resp. rate 18, height 5' 7.5" (1.715 m), weight 72.6 kg (160 lb), SpO2 98 %.  Physical exam:   General--Pleasant elderly white female ENT--nonicteric Neck--supple Heart--regular rate and rhythm without murmurs or gallops Lungs--clear Abdomen--soft nontender. Psych--alert and oriented answers questions appropriately   Assessment: 1.  Dysphagia.  This does not appear to be a chronic problem but more here in the hospital for unclear reasons.  She has a known tortuous esophagus and likely has motility problems.  Previous endoscopy showed a large hiatal hernia in the past.  I am hesitant to do an EGD unless absolutely needed since she is anticoagulated 2.  Chronic diarrhea.  C. difficile toxin and GI pathogen panel are negative.  She has had recent colon biopsies are negative.  This is likely postcholecystectomy diarrhea and I do not feel that he needs further workup at this time. 3.  GERD 4.  Atrial fibrillation with RVR.  This is new onset and now is under good control.  The patient is anticoagulated  Plan: We will go ahead and start with a barium swallow with pill.  If this does not show a clear obstruction I would favor discharging her on a mechanically soft diet and she could  follow-up in the office with Dr Watt Climes to see how she is doing.   Nancy Fetter 01/06/2017, 1:04 PM   This note was created using voice recognition software and minor errors may Have occurred unintentionally. Pager: [phone] If no answer or after hours call [phone]

## 2017-01-07 LAB — BASIC METABOLIC PANEL
ANION GAP: 7 (ref 5–15)
BUN: 17 mg/dL (ref 6–20)
CHLORIDE: 116 mmol/L — AB (ref 101–111)
CO2: 19 mmol/L — AB (ref 22–32)
CREATININE: 1.07 mg/dL — AB (ref 0.44–1.00)
Calcium: 8.8 mg/dL — ABNORMAL LOW (ref 8.9–10.3)
GFR calc non Af Amer: 47 mL/min — ABNORMAL LOW (ref >60)
GFR, EST AFRICAN AMERICAN: 55 mL/min — AB (ref >60)
Glucose, Bld: 108 mg/dL — ABNORMAL HIGH (ref 65–99)
POTASSIUM: 3.6 mmol/L (ref 3.5–5.1)
SODIUM: 142 mmol/L (ref 135–145)

## 2017-01-07 LAB — CBC
HEMATOCRIT: 34.3 % — AB (ref 36.0–46.0)
Hemoglobin: 10.4 g/dL — ABNORMAL LOW (ref 12.0–15.0)
MCH: 31 pg (ref 26.0–34.0)
MCHC: 30.3 g/dL (ref 30.0–36.0)
MCV: 102.4 fL — AB (ref 78.0–100.0)
PLATELETS: 195 10*3/uL (ref 150–400)
RBC: 3.35 MIL/uL — AB (ref 3.87–5.11)
RDW: 13.7 % (ref 11.5–15.5)
WBC: 5.5 10*3/uL (ref 4.0–10.5)

## 2017-01-07 LAB — GLUCOSE, CAPILLARY
GLUCOSE-CAPILLARY: 140 mg/dL — AB (ref 65–99)
Glucose-Capillary: 154 mg/dL — ABNORMAL HIGH (ref 65–99)
Glucose-Capillary: 109 mg/dL — ABNORMAL HIGH (ref 65–99)
Glucose-Capillary: 101 mg/dL — ABNORMAL HIGH (ref 65–99)

## 2017-01-07 MED ORDER — FUROSEMIDE 10 MG/ML IJ SOLN
20.0000 mg | Freq: Once | INTRAMUSCULAR | Status: AC
  Administered 2017-01-07: 20 mg via INTRAVENOUS
  Filled 2017-01-07: qty 2

## 2017-01-07 MED ORDER — DILTIAZEM HCL 60 MG PO TABS
60.0000 mg | ORAL_TABLET | Freq: Four times a day (QID) | ORAL | Status: DC
  Administered 2017-01-07 – 2017-01-08 (×5): 60 mg via ORAL
  Filled 2017-01-07 (×5): qty 1

## 2017-01-07 MED ORDER — ONDANSETRON HCL 4 MG/2ML IJ SOLN
4.0000 mg | Freq: Four times a day (QID) | INTRAMUSCULAR | Status: DC | PRN
  Administered 2017-01-07: 4 mg via INTRAVENOUS
  Filled 2017-01-07: qty 2

## 2017-01-07 MED ORDER — POTASSIUM CHLORIDE CRYS ER 20 MEQ PO TBCR
40.0000 meq | EXTENDED_RELEASE_TABLET | Freq: Once | ORAL | Status: AC
Start: 2017-01-07 — End: 2017-01-07
  Administered 2017-01-07: 40 meq via ORAL
  Filled 2017-01-07: qty 2

## 2017-01-07 NOTE — Progress Notes (Signed)
Physical Therapy Treatment Patient Details Name: Kendra Maldonado MRN: [id number] DOB: [birth year] Today's Date: 01/07/2017    History of Present Illness 83 y.o. 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    PT Comments    RN reports HR is WNL.  Assisted pt OOB immediately to [facility] ([facility]) then amb in hallway.  Limited activity tolerance due to weakness and MAX c/o fatigue.   Pt will need ST Rehab at SNF prior to safely returning home alone.   Follow Up Recommendations  SNF;Supervision for mobility/OOB     Equipment Recommendations  None recommended by PT    Recommendations for Other Services       Precautions / Restrictions Precautions Precautions: Fall Precaution Comments: monitor HR Restrictions Weight Bearing Restrictions: No    Mobility  Bed Mobility Overal bed mobility: Needs Assistance Bed Mobility: Supine to Sit     Supine to sit: Supervision;HOB elevated     General bed mobility comments: able to self perform with increased time  Transfers Overall transfer level: Needs assistance Equipment used: Rolling walker (2 wheeled)   Sit to Stand: Supervision;Min guard         General transfer comment: good use of hands to staedy self and good turn completion  Ambulation/Gait Ambulation/Gait assistance: Supervision;Min guard Ambulation Distance (Feet): 85 Feet Assistive device: Rolling walker (2 wheeled) Gait Pattern/deviations: Step-through pattern;Decreased stride length Gait velocity: decreased   General Gait Details: slow but steady gait with RW, distance to tolerance, pt admiots to using a rollator at home "sometimes'   Stairs             Wheelchair Mobility    Modified Rankin (Stroke Patients Only)       Balance                                            Cognition Arousal/Alertness: Awake/alert Behavior During Therapy: WFL for tasks assessed/performed Overall Cognitive Status: Within Functional Limits for tasks assessed                                 General Comments: pleasant      Exercises      General Comments        Pertinent Vitals/Pain Pain Assessment: No/denies pain    Home Living                      Prior Function            PT Goals (current goals can now be found in the care plan section) Progress towards PT goals: Progressing toward goals    Frequency    Min 3X/week      PT Plan Current plan remains appropriate    Co-evaluation              AM-PAC PT "6 Clicks" Daily Activity  Outcome Measure  Difficulty turning over in bed (including adjusting bedclothes, sheets and blankets)?: None Difficulty moving from lying on back to sitting on the side of the  bed? : A Little Difficulty sitting down on and standing up from a chair with arms (e.g., wheelchair, bedside commode, etc,.)?: A Little Help needed moving to and from a bed to chair (including a wheelchair)?: A Little Help needed walking in hospital room?: A Little Help needed climbing 3-5 steps with a railing? : A Lot 6 Click Score: 18    End of Session Equipment Utilized During Treatment: Gait belt Activity Tolerance: Patient tolerated treatment well Patient left: in chair;with call bell/phone within reach Nurse Communication: Mobility status PT Visit Diagnosis: Difficulty in walking, not elsewhere classified (R26.2)     Time: 1400-1415 PT Time Calculation (min) (ACUTE ONLY): 15 min  Charges:  $Gait Training: 8-22 mins                    G Codes:       Rica Koyanagi  PTA [facility]  Acute  Rehab Pager      [phone]

## 2017-01-07 NOTE — Progress Notes (Addendum)
Progress Note  Patient Name: Kendra Maldonado Date of Encounter: 01/07/2017  Primary Cardiologist: No primary care provider on file.   Subjective    Denies any chest pain, SOB or palpitations.  Heart rate better controlled but still in upper 90's with some HR going up into the 130's but not as much.    Inpatient Medications    Scheduled Meds: . chlorhexidine  15 mL Mouth Rinse BID  . cholestyramine  4 g Oral TID  . dextromethorphan-guaiFENesin  1 tablet Oral BID  . insulin aspart  0-15 Units Subcutaneous TID WC  . insulin aspart  0-5 Units Subcutaneous QHS  . iron polysaccharides  150 mg Oral Daily  . loratadine  10 mg Oral Daily  . losartan  50 mg Oral Daily  . mouth rinse  15 mL Mouth Rinse q12n4p  . metoprolol tartrate  100 mg Oral BID  . pantoprazole  40 mg Oral BID AC  . rivaroxaban   Does not apply Once  . rivaroxaban  15 mg Oral Q supper   Continuous Infusions: . diltiazem (CARDIZEM) infusion 5 mg/hr (01/07/17 0121)   PRN Meds: diphenoxylate-atropine, labetalol, LORazepam, menthol-cetylpyridinium   Vital Signs    Vitals:   01/06/17 1300 01/06/17 2109 01/07/17 0621 01/07/17 0830  BP: 136/88 (!) 144/97 (!) 150/93 (!) 126/119  Pulse: 89 68 69 (!) 118  Resp:  20 20 (!) 30  Temp:  98.7 F (37.1 C) 98 F (36.7 C) 98 F (36.7 C)  TempSrc:  Oral Oral Oral  SpO2:  97% 97% 97%  Weight:      Height:        Intake/Output Summary (Last 24 hours) at 01/07/2017 1111 Last data filed at 01/07/2017 0600 Gross per 24 hour  Intake 98.67 ml  Output 350 ml  Net -251.33 ml   Filed Weights   12/31/16 1408  Weight: 160 lb (72.6 kg)    Telemetry    Atrial fibrillation with CVR for the most part in the 90'w - Personally Reviewed  ECG    No new EKG to revie - Personally Reviewed  Physical Exam   GEN: No acute distress.   Neck: No JVD Cardiac: irregularly irregular, no murmurs, rubs, or gallops.  Respiratory: Clear to auscultation bilaterally. GI: Soft,  nontender, non-distended  MS: No edema; No deformity. Neuro:  Nonfocal  Psych: Normal affect   Labs    Chemistry Recent Labs  Lab 01/01/17 0501  01/05/17 0511 01/06/17 0441 01/07/17 0432  NA 144   < > 143 141 142  K 3.8   < > 3.4* 3.8 3.6  CL 116*   < > 117* 115* 116*  CO2 20*   < > 19* 18* 19*  GLUCOSE 127*   < > 106* 132* 108*  BUN 14   < > 18 22* 17  CREATININE 1.03*   < > 1.06* 1.22* 1.07*  CALCIUM 8.6*   < > 8.8* 8.8* 8.8*  PROT 6.0*  --   --   --   --   ALBUMIN 3.2*  --   --   --   --   AST 20  --   --   --   --   ALT 45  --   --   --   --   ALKPHOS 135*  --   --   --   --   BILITOT 0.5  --   --   --   --   [facility]  50*   < > 48* 40* 47*  GFRAA 57*   < > 56* 47* 55*  ANIONGAP 8   < > 7 8 7    < > = values in this interval not displayed.     Hematology Recent Labs  Lab 01/05/17 0511 01/06/17 0441 01/07/17 0432  WBC 6.4 6.6 5.5  RBC 3.42* 3.34* 3.35*  HGB 10.4* 10.2* 10.4*  HCT 35.6* 34.5* 34.3*  MCV 104.1* 103.3* 102.4*  MCH 30.4 30.5 31.0  MCHC 29.2* 29.6* 30.3  RDW 13.8 13.8 13.7  PLT 197 188 195    Cardiac EnzymesNo results for input(s): TROPONINI in the last 168 hours. No results for input(s): TROPIPOC in the last 168 hours.   BNPNo results for input(s): BNP, PROBNP in the last 168 hours.   DDimer No results for input(s): DDIMER in the last 168 hours.   Radiology    Dg Esophagus  Result Date: 01/06/2017 CLINICAL DATA:  83 year old female with dysphagia. Initial encounter. EXAM: ESOPHOGRAM/BARIUM SWALLOW TECHNIQUE: Single contrast examination was performed using thin barium. FLUOROSCOPY TIME:  Fluoroscopy Time:  48 seconds Radiation Exposure Index:  6 mGy COMPARISON:  None. FINDINGS: Present examination is limited by the fact patient was not able to change position, ingest any significant amount of barium or ingest a barium tablet. Marked presbyesophagus. Moderate-size hiatal hernia. No significant obstructing lesion detected on this limited exam.  No aspiration noted during exam. IMPRESSION: Limited exam. Marked presbyesophagus. Moderate-size hiatal hernia. Electronically Signed   By: Genia Del M.D.   On: 01/06/2017 16:39    Cardiac Studies   2D Echo 12/31/16 Study Conclusions - Left ventricle: The cavity size was normal. Wall thickness was normal. Systolic function was normal. The estimated ejection fraction was in the range of 55% to 60%. Wall motion was normal; there were no regional wall motion abnormalities. - Mitral valve: Calcified annulus. - Pulmonary arteries: Systolic pressure was moderately increased. PA peak pressure: 53 mm Hg (S). - Pericardium, extracardiac: A trivial pericardial effusion was identified. Impressions: - Normal LV function; mild TR; moderate pulmonary hypertension.   Patient Profile     83 y.o. female withCVAin setting ofcerebrovascular disease2017, DM, HTN, hiatal hernia, chronic diarrhea, breast CA, spinal stenosis, chronic Paget's disease of the pelvis, upper GIB with symptomatic anemia 11/2013,worsening anemia 01/2016 withsuspected MGUS, lung nodule, probable CKD II-IIIwhowas admitted with increased weakness and inability to care for self x 1 month, also persistent daily diarrhea and decreased PO intake. She was found to have new onset atrial fib RVR.  Assessment & Plan    1.Persistent diarrhea -may be contributing to her weakness, given her poor oral intake over the last month. - per [facility]  2. Newly recognized atrial fib RVR -patient is generally unaware of this but it is also likely contributing to her weakness. CHADSVASC 8 for HTN, age, stroke, cerebrovascular disease, and female. With her history of significant GI bleeding and anemia further complicated by MGUS, it is not clear that she is ideal candidate for anticoagulation.  - 2D echo with normal LVF.  - tolerated Lovenox so now on Xarelto 15mg  daily and tolerating.  - rate better controlled now on Metoprolol  100mg  BID.  Placed on IV Cardizem yesterday with improvement in HR.  - she had a few 2 seconds pauses on tele but asymptomatic.  Continue current meds.  - Change IV Cardizem to Cardizem PO 60mg  q6hrs.  Continue BB.  3. Flucutating anemia- per primary team, likely multifactorial picture.   4.  Hypokalemia -IM managing, improved and 3.6 today. Mag 2.1.   5. HTN - BP elevated today - continue lopressor 100mg  BID and change to Cardizem 60mg  q6 hrs and adjust as needed for better BP control    For questions or updates, please contact [place] Please consult [email or web address] for contact info under Cardiology/STEMI.      Signed, Fransico Him, MD  01/07/2017, 11:11 AM

## 2017-01-07 NOTE — Progress Notes (Signed)
Kendra Maldonado 3:00 PM  Subjective: Patient doing okay without any new complaints and her hospital computer chart and our office computer chart was reviewed and case discussed with my partner Dr. Oletta Lamas and we reviewed her barium swallow with Dr. Jeannine Kitten  Objective: Vital signs stable afebrile no acute distress abdomen is soft nontender barium swallow without significant stricture or worrisome finding despite poor study  Assessment: Multiple medical problems including multiple GI complaints  Plan: Please call us with this hospital stay if we could be of any further assistance otherwise I'm happy to see her back in the office if swallowing problems continue or if diarrhea increases despite Questran which has helped her in the past  [facility]  Pager [phone] After 5PM or if no answer call [phone]

## 2017-01-07 NOTE — Progress Notes (Signed)
VI found out by NT during rounding.  IV team notified urgent to replace.

## 2017-01-07 NOTE — Progress Notes (Signed)
IV placed by IV team.  Tubing changed and Cardizem restarted at previous rate.

## 2017-01-07 NOTE — Care Management Important Message (Signed)
Important Message  Patient Details  Name: Kendra Maldonado MRN: [id number] Date of Birth: 1933-07-03   Medicare Important Message Given:  Yes    Kerin Salen 01/07/2017, 12:00 [place] Message  Patient Details  Name: Kendra Maldonado MRN: [id number] Date of Birth: 1933-06-06   Medicare Important Message Given:  Yes    Kerin Salen 01/07/2017, 11:57 AMImportant Message  Patient Details  Name: Kendra Maldonado MRN: [id number] Date of Birth: Feb 12, 1933   Medicare Important Message Given:  Yes    Kerin Salen 01/07/2017, 11:57 AM

## 2017-01-07 NOTE — Progress Notes (Signed)
PROGRESS NOTE    Kendra Maldonado  OHY:[id number] DOB: 01/07/1934 DOA: 12/31/2016 PCP: Lavone Orn, MD    Brief Narrative:  83 year old female history of prior CVA, diabetes, hypertension, history of hernia, chronic diarrhea brought to the ED by family members due to increased weakness.  Patient noted to be in A. fib with RVR which is new onset was given IV Lopressor and subsequently ended up being placed on a Cardizem drip.  Cardiology consulted.  Patient also with complaints of dysphagia.  GI consulted.   Assessment & Plan:  New onset A. fib with RVR/ 2-2.5 sec pauses  -started on high doses of metoprolol and now on cardizem gtt -HR improving -Cards following and titrating meds -chads2vasc score is 8, started on Xarelto now -appears slightly volume overloaded, IV lasix 20mg  x1 now  Chronic persistent diarrhea Patient states he has had persistent diarrhea since cholecystectomy around when she was age 53.  Patient states she is followed up with GI in the outpatient setting however cannot remember what medications she was being given with her meals to help with her stools.  Patient states her stools are both loose and soft.  Consistency of stools improved with decreasing frequency on Questran.  C. difficile PCR negative.  GI pathogen panel negative. Continue supportive care.  Outpatient follow-up with GI.   Dysphagia Patient with complaints of dysphasia states she does have troubling swallowing food and feels the food gets stuck.  Patient with history of hiatal hernia.  Continue PPI -appreciate GI input, esophagram with presbyesophagus and hiatal hernia -supportive care  Iron deficiency anemia Hemoglobin currently stable at 10.2.  No signs of bleeding.  Anemia panel consistent with iron deficiency anemia. s/p IV Feraheme x1.  Likely benefit from oral iron supplementation on discharge. Continue Nu-Iron 150 mg daily.  Hypertension Blood pressure currently stable.  Metoprolol dose has  been increased to 100 mg twice daily  -.  Continue Cozaar.  Cardizem per Cards  Diabetes mellitus type 2 -Hemoglobin A1c 5.6 on 01/03/2017.  CBGs have ranged from 102-134.  Continue sliding scale insulin.  DVT prophylaxis: xarelto Code Status: DNR Family Communication: updated patient at bedside. Disposition Plan: Skilled nursing facility when medically stable and heart rate controlled   Consultants:   Cardiology: Dr. Meda Coffee 01/01/2017 Gastroenterology pending  Procedures:   Acute abdominal series 12/31/2016  2D echo 12/31/2016  Antimicrobials:   None   Subjective: -feels ok, reports chronic diarrhea, stools more formed today Objective: Vitals:   01/06/17 2109 01/07/17 0621 01/07/17 0830 01/07/17 1147  BP: (!) 144/97 (!) 150/93 (!) 126/119 123/84  Pulse: 68 69 (!) 118 67  Resp: 20 20 (!) 30 20  Temp: 98.7 F (37.1 C) 98 F (36.7 C) 98 F (36.7 C) 97.8 F (36.6 C)  TempSrc: Oral Oral Oral Oral  SpO2: 97% 97% 97% 97%  Weight:      Height:        Intake/Output Summary (Last 24 hours) at 01/07/2017 1438 Last data filed at 01/07/2017 1425 Gross per 24 hour  Intake 338.67 ml  Output 1852 ml  Net -1513.33 ml   Filed Weights   12/31/16 1408  Weight: 72.6 kg (160 lb)    Examination:  Gen: Awake, Alert, Oriented X 3,  HEENT: PERRLA, Neck supple, no JVD Lungs: decreased BS at bases CVS: S1S2/Irregular Abd: soft, Non tender, non distended, BS present Extremities: 1 plus edema Skin: no new rashes Psychiatry: Judgement and insight appear normal. Mood & affect appropriate.  Data Reviewed: I have personally reviewed following labs and imaging studies  CBC: Recent Labs  Lab 01/03/17 0516 01/04/17 0530 01/05/17 0511 01/06/17 0441 01/07/17 0432  WBC 5.0 5.9 6.4 6.6 5.5  HGB 10.2* 10.1* 10.4* 10.2* 10.4*  HCT 33.8* 33.5* 35.6* 34.5* 34.3*  MCV 102.4* 103.4* 104.1* 103.3* 102.4*  PLT 158 179 197 188 053   Basic Metabolic Panel: Recent Labs  Lab  01/01/17 0501 01/02/17 0538 01/03/17 0516 01/04/17 0530 01/05/17 0511 01/06/17 0441 01/07/17 0432  NA 144 139 141 141 143 141 142  K 3.8 3.4* 3.6 3.5 3.4* 3.8 3.6  CL 116* 111 116* 113* 117* 115* 116*  CO2 20* 19* 19* 18* 19* 18* 19*  GLUCOSE 127* 131* 120* 106* 106* 132* 108*  BUN 14 14 14 19 18  22* 17  CREATININE 1.03* 1.18* 1.25* 1.11* 1.06* 1.22* 1.07*  CALCIUM 8.6* 8.4* 8.8* 8.4* 8.8* 8.8* 8.8*  MG 1.7 2.4 2.1  --   --   --   --    GFR: Estimated Creatinine Clearance: 40.9 mL/min (A) (by C-G formula based on SCr of 1.07 mg/dL (H)). Liver Function Tests: Recent Labs  Lab 01/01/17 0501  AST 20  ALT 45  ALKPHOS 135*  BILITOT 0.5  PROT 6.0*  ALBUMIN 3.2*   No results for input(s): LIPASE, AMYLASE in the last 168 hours. No results for input(s): AMMONIA in the last 168 hours. Coagulation Profile: No results for input(s): INR, PROTIME in the last 168 hours. Cardiac Enzymes: No results for input(s): CKTOTAL, CKMB, CKMBINDEX, TROPONINI in the last 168 hours. BNP (last 3 results) No results for input(s): PROBNP in the last 8760 hours. HbA1C: No results for input(s): HGBA1C in the last 72 hours. CBG: Recent Labs  Lab 01/06/17 1132 01/06/17 1712 01/06/17 2106 01/07/17 0749 01/07/17 1150  GLUCAP 134* 102* 141* 154* 109*   Lipid Profile: No results for input(s): CHOL, HDL, LDLCALC, TRIG, CHOLHDL, LDLDIRECT in the last 72 hours. Thyroid Function Tests: No results for input(s): TSH, T4TOTAL, FREET4, T3FREE, THYROIDAB in the last 72 hours. Anemia Panel: No results for input(s): VITAMINB12, FOLATE, FERRITIN, TIBC, IRON, RETICCTPCT in the last 72 hours. Sepsis Labs: No results for input(s): PROCALCITON, LATICACIDVEN in the last 168 hours.  Recent Results (from the past 240 hour(s))  C difficile quick scan w PCR reflex     Status: None   Collection Time: 01/02/17 12:51 PM  Result Value Ref Range Status   C Diff antigen NEGATIVE NEGATIVE Final   C Diff toxin NEGATIVE  NEGATIVE Final   C Diff interpretation No C. difficile detected.  Final  Gastrointestinal Panel by PCR , Stool     Status: None   Collection Time: 01/02/17 12:51 PM  Result Value Ref Range Status   Campylobacter species NOT DETECTED NOT DETECTED Final   Plesimonas shigelloides NOT DETECTED NOT DETECTED Final   Salmonella species NOT DETECTED NOT DETECTED Final   Yersinia enterocolitica NOT DETECTED NOT DETECTED Final   Vibrio species NOT DETECTED NOT DETECTED Final   Vibrio cholerae NOT DETECTED NOT DETECTED Final   Enteroaggregative E coli (EAEC) NOT DETECTED NOT DETECTED Final   Enteropathogenic E coli (EPEC) NOT DETECTED NOT DETECTED Final   Enterotoxigenic E coli (ETEC) NOT DETECTED NOT DETECTED Final   Shiga like toxin producing E coli (STEC) NOT DETECTED NOT DETECTED Final   Shigella/Enteroinvasive E coli (EIEC) NOT DETECTED NOT DETECTED Final   Cryptosporidium NOT DETECTED NOT DETECTED Final   Cyclospora cayetanensis NOT DETECTED  NOT DETECTED Final   Entamoeba histolytica NOT DETECTED NOT DETECTED Final   Giardia lamblia NOT DETECTED NOT DETECTED Final   Adenovirus F40/41 NOT DETECTED NOT DETECTED Final   Astrovirus NOT DETECTED NOT DETECTED Final   Norovirus GI/GII NOT DETECTED NOT DETECTED Final   Rotavirus A NOT DETECTED NOT DETECTED Final   Sapovirus (I, II, IV, and V) NOT DETECTED NOT DETECTED Final         Radiology Studies: Dg Esophagus  Result Date: 01/06/2017 CLINICAL DATA:  83 year old female with dysphagia. Initial encounter. EXAM: ESOPHOGRAM/BARIUM SWALLOW TECHNIQUE: Single contrast examination was performed using thin barium. FLUOROSCOPY TIME:  Fluoroscopy Time:  48 seconds Radiation Exposure Index:  6 mGy COMPARISON:  None. FINDINGS: Present examination is limited by the fact patient was not able to change position, ingest any significant amount of barium or ingest a barium tablet. Marked presbyesophagus. Moderate-size hiatal hernia. No significant obstructing  lesion detected on this limited exam. No aspiration noted during exam. IMPRESSION: Limited exam. Marked presbyesophagus. Moderate-size hiatal hernia. Electronically Signed   By: Genia Del M.D.   On: 01/06/2017 16:39        Scheduled Meds: . chlorhexidine  15 mL Mouth Rinse BID  . cholestyramine  4 g Oral TID  . dextromethorphan-guaiFENesin  1 tablet Oral BID  . diltiazem  60 mg Oral Q6H  . insulin aspart  0-15 Units Subcutaneous TID WC  . insulin aspart  0-5 Units Subcutaneous QHS  . iron polysaccharides  150 mg Oral Daily  . loratadine  10 mg Oral Daily  . losartan  50 mg Oral Daily  . mouth rinse  15 mL Mouth Rinse q12n4p  . metoprolol tartrate  100 mg Oral BID  . pantoprazole  40 mg Oral BID AC  . rivaroxaban   Does not apply Once  . rivaroxaban  15 mg Oral Q supper   Continuous Infusions:    LOS: 7 days    Time spent: 35 mins    Domenic Polite, MD [facility] Page via [email or web address] password [email or web address]  If 7PM-7AM, please contact night-coverage [email or web address] Password [email or web address] 01/07/2017, 2:38 PM

## 2017-01-08 LAB — GLUCOSE, CAPILLARY
Glucose-Capillary: 114 mg/dL — ABNORMAL HIGH (ref 65–99)
Glucose-Capillary: 156 mg/dL — ABNORMAL HIGH (ref 65–99)
Glucose-Capillary: 96 mg/dL (ref 65–99)
Glucose-Capillary: 139 mg/dL — ABNORMAL HIGH (ref 65–99)

## 2017-01-08 MED ORDER — DIPHENOXYLATE-ATROPINE 2.5-0.025 MG PO TABS
2.0000 | ORAL_TABLET | Freq: Four times a day (QID) | ORAL | Status: DC | PRN
  Administered 2017-01-09: 2 via ORAL
  Filled 2017-01-08: qty 2

## 2017-01-08 MED ORDER — DILTIAZEM HCL ER COATED BEADS 240 MG PO CP24
240.0000 mg | ORAL_CAPSULE | Freq: Every day | ORAL | Status: DC
  Administered 2017-01-08 – 2017-01-09 (×2): 240 mg via ORAL
  Filled 2017-01-08 (×2): qty 1

## 2017-01-08 MED ORDER — CHOLESTYRAMINE 4 G PO PACK
4.0000 g | PACK | Freq: Two times a day (BID) | ORAL | Status: DC
  Administered 2017-01-08: 4 g via ORAL
  Filled 2017-01-08 (×5): qty 1

## 2017-01-08 NOTE — Progress Notes (Signed)
Progress Note  Patient Name: Kendra Maldonado Date of Encounter: 01/08/2017  Primary Cardiologist: Dr. Ena Dawley  Subjective   Pt is still feeling weak and gets dizzy when up moving around. No chest pain or dyspnea. She is still not able to eat much and had an episode of vomiting last night. She thinks that all of the new med changes are causing her problems. She also says that she needs her PPI before she eats.   Inpatient Medications    Scheduled Meds: . chlorhexidine  15 mL Mouth Rinse BID  . cholestyramine  4 g Oral TID  . dextromethorphan-guaiFENesin  1 tablet Oral BID  . diltiazem  60 mg Oral Q6H  . insulin aspart  0-15 Units Subcutaneous TID WC  . insulin aspart  0-5 Units Subcutaneous QHS  . iron polysaccharides  150 mg Oral Daily  . loratadine  10 mg Oral Daily  . losartan  50 mg Oral Daily  . mouth rinse  15 mL Mouth Rinse q12n4p  . metoprolol tartrate  100 mg Oral BID  . pantoprazole  40 mg Oral BID AC  . rivaroxaban   Does not apply Once  . rivaroxaban  15 mg Oral Q supper   Continuous Infusions:  PRN Meds: diphenoxylate-atropine, labetalol, LORazepam, menthol-cetylpyridinium, ondansetron (ZOFRAN) IV   Vital Signs    Vitals:   01/07/17 1147 01/07/17 1754 01/07/17 2100 01/08/17 0452  BP: 123/84 132/80 107/86 105/65  Pulse: 67 83 65 89  Resp: 20 19 18 18   Temp: 97.8 F (36.6 C) 98.4 F (36.9 C) 98.3 F (36.8 C) 98 F (36.7 C)  TempSrc: Oral Oral Oral Oral  SpO2: 97% 97% 96% 95%  Weight:      Height:        Intake/Output Summary (Last 24 hours) at 01/08/2017 0919 Last data filed at 01/08/2017 0600 Gross per 24 hour  Intake 269.08 ml  Output 1703 ml  Net -1433.92 ml   Filed Weights   12/31/16 1408  Weight: 160 lb (72.6 kg)    Telemetry    Atrial fibrillation in the 80's overnight, 90's-low 100's this am - Personally Reviewed  ECG    No new tracings - Personally Reviewed  Physical Exam   GEN: No acute distress.   Neck: No  JVD Cardiac: Irregular irregular rhythm, no murmurs, rubs, or gallops.  Respiratory: Clear to auscultation bilaterally with few crackles in the bases.  GI: Soft, nontender, non-distended  MS: Trace pretibial edema in left leg, no edema in right leg; No deformity. Neuro:  Nonfocal  Psych: Normal affect   Labs    Chemistry Recent Labs  Lab 01/05/17 0511 01/06/17 0441 01/07/17 0432  NA 143 141 142  K 3.4* 3.8 3.6  CL 117* 115* 116*  CO2 19* 18* 19*  GLUCOSE 106* 132* 108*  BUN 18 22* 17  CREATININE 1.06* 1.22* 1.07*  CALCIUM 8.8* 8.8* 8.8*  GFRNONAA 48* 40* 47*  GFRAA 56* 47* 55*  ANIONGAP 7 8 7      Hematology Recent Labs  Lab 01/05/17 0511 01/06/17 0441 01/07/17 0432  WBC 6.4 6.6 5.5  RBC 3.42* 3.34* 3.35*  HGB 10.4* 10.2* 10.4*  HCT 35.6* 34.5* 34.3*  MCV 104.1* 103.3* 102.4*  MCH 30.4 30.5 31.0  MCHC 29.2* 29.6* 30.3  RDW 13.8 13.8 13.7  PLT 197 188 195    Cardiac EnzymesNo results for input(s): TROPONINI in the last 168 hours. No results for input(s): TROPIPOC in the last 168 hours.  BNPNo results for input(s): BNP, PROBNP in the last 168 hours.   DDimer No results for input(s): DDIMER in the last 168 hours.   Radiology    Dg Esophagus  Result Date: 01/06/2017 CLINICAL DATA:  83 year old female with dysphagia. Initial encounter. EXAM: ESOPHOGRAM/BARIUM SWALLOW TECHNIQUE: Single contrast examination was performed using thin barium. FLUOROSCOPY TIME:  Fluoroscopy Time:  48 seconds Radiation Exposure Index:  6 mGy COMPARISON:  None. FINDINGS: Present examination is limited by the fact patient was not able to change position, ingest any significant amount of barium or ingest a barium tablet. Marked presbyesophagus. Moderate-size hiatal hernia. No significant obstructing lesion detected on this limited exam. No aspiration noted during exam. IMPRESSION: Limited exam. Marked presbyesophagus. Moderate-size hiatal hernia. Electronically Signed   By: Genia Del  M.D.   On: 01/06/2017 16:39    Cardiac Studies   2D Echo 12/31/16 Study Conclusions - Left ventricle: The cavity size was normal. Wall thickness was normal. Systolic function was normal. The estimated ejection fraction was in the range of 55% to 60%. Wall motion was normal; there were no regional wall motion abnormalities. - Mitral valve: Calcified annulus. - Pulmonary arteries: Systolic pressure was moderately increased. PA peak pressure: 53 mm Hg (S). - Pericardium, extracardiac: A trivial pericardial effusion was identified. Impressions: - Normal LV function; mild TR; moderate pulmonary hypertension.  Patient Profile     83 y.o. female withCVAin setting ofcerebrovascular disease2017, DM, HTN, hiatal hernia, chronic diarrhea, breast CA, spinal stenosis, chronic Paget's disease of the pelvis, upper GIB with symptomatic anemia 11/2013,worsening anemia 01/2016 withsuspected MGUS, lung nodule, probable CKD II-IIIwhowas admitted with increased weakness and inability to care for self x 1 month, also persistent daily diarrhea and decreased PO intake. She was found to have new onset atrial fib RVR.  Assessment & Plan    1. Persistent diarrhea -hx cholecystectomy -may be contributing to her weakness, given poor oral intake over the last month. -Management per [facility]. GI input suggests that there were no worrisome findings on previous studies. They feel that Questran should help as it has helped in the past.   2. Newly recognized atrial fib with RVR -Pt has been generally unaware of the this and it may have been contributing to her weakness.  -CHADSVASC 8 for HTN, age, stroke, cerebrovascular disease, and female. With her history of significant GI bleeding and anemia further complicated by MGUS, it is not clear that she is ideal candidate for anticoagulation. Has been started on Xarelto 15 mg daily. -Echo showed normal LV function and normal LA size -Rate is better  controlled on lopressor 100 mg bid and cardizem 60 mg q6h. Stilll in afib with rates in the 80's overnight, in the 90's to low 100's today. BP is soft but stable, 105/65 at 5am. Continue to monitor. Can go down on ARB to make room for rhythm control meds if needed.  -Continue current medications and can consolidate cardizem   3. Fluctuating anemia- Management per primary team, likeley multifactorial etiology. Hgb stable at 10.4  4. Hypertension -BP a little soft early this am, 105/65. Continue to monitor while awake.  -If needed can reduce Cozaar to allow for room in BP for BB and CCB  5. Hypokalemia -management per IM. K+ 3.6 today.   For questions or updates, please contact [place] Please consult [email or web address] for contact info under Cardiology/STEMI.      Signed, Daune Perch, NP  01/08/2017, 9:19 AM

## 2017-01-08 NOTE — Progress Notes (Signed)
PROGRESS NOTE    Kendra Maldonado  NKN:[id number] DOB: [birth year] DOA: 12/31/2016 PCP: Lavone Orn, MD    Brief Narrative:  83 year old female history of prior CVA, diabetes, hypertension, history of hernia, chronic diarrhea brought to the ED by family members due to increased weakness.  Patient noted to be in A. fib with RVR which is new onset was given IV Lopressor and subsequently ended up being placed on a Cardizem drip.  Cardiology consulted.  Patient also with complaints of dysphagia.  GI consulted.   Assessment & Plan:  New onset A. fib with RVR/ 2-2.5 sec pauses  -started on high doses of metoprolol and was on cardizem gtt -HR improving, now on cardizem Po, change to cardizem CD -Cards following and titrating meds -chads2vasc score is 8, started on Xarelto now  Chronic persistent diarrhea Patient states he has had persistent diarrhea since cholecystectomy around when she was age 28.  Patient states she is followed up with GI in the outpatient setting  - Patient states her stools are both loose and soft.  Consistency of stools improved with decreasing frequency on Questran.  C. difficile PCR negative.  GI pathogen panel negative. Continue supportive care.  -continues to be a big concern -advised lomotil atleast once or BID PRN - Outpatient follow-up with GI.   Dysphagia Patient with complaints of dysphasia states she does have troubling swallowing food and feels the food gets stuck.  Patient with history of hiatal hernia.  Continue PPI -appreciate GI input, esophagram with presbyesophagus and hiatal hernia -supportive care  Iron deficiency anemia Hemoglobin currently stable at 10.2.  No signs of bleeding.  Anemia panel consistent with iron deficiency anemia. s/p IV Feraheme x1.  Likely benefit from oral iron supplementation on discharge.  -hold iron now with GI symptoms  Hypertension Blood pressure soft, continue metop and cardizem, hold ARB  Diabetes mellitus type  2 -Hemoglobin A1c 5.6 on 01/03/2017.  CBGs have ranged from 102-134.  Continue sliding scale insulin.  DVT prophylaxis: xarelto Code Status: DNR Family Communication: updated patient at bedside. Disposition Plan: Skilled nursing facility when medically stable and heart rate controlled, ? 1-2days   Consultants:   Cardiology: Dr. Meda Coffee 01/01/2017 Gastroenterology pending  Procedures:   Acute abdominal series 12/31/2016  2D echo 12/31/2016  Antimicrobials:   None   Subjective: -feels ok, reports chronic diarrhea, stools more formed today Objective: Vitals:   01/07/17 1147 01/07/17 1754 01/07/17 2100 01/08/17 0452  BP: 123/84 132/80 107/86 105/65  Pulse: 67 83 65 89  Resp: 20 19 18 18   Temp: 97.8 F (36.6 C) 98.4 F (36.9 C) 98.3 F (36.8 C) 98 F (36.7 C)  TempSrc: Oral Oral Oral Oral  SpO2: 97% 97% 96% 95%  Weight:      Height:        Intake/Output Summary (Last 24 hours) at 01/08/2017 1316 Last data filed at 01/08/2017 0600 Gross per 24 hour  Intake 0 ml  Output 803 ml  Net -803 ml   Filed Weights   12/31/16 1408  Weight: 72.6 kg (160 lb)    Examination:  Gen: Awake, Alert, Oriented X 3,  HEENT: PERRLA, Neck supple, no JVD Lungs: Good air movement bilaterally, CTAB CVS: RRR,No Gallops,Rubs or new Murmurs Abd: soft, Non tender, non distended, BS present Extremities: trace edema Skin: no new rashes Psychiatry: Judgement and insight appear normal. Mood & affect appropriate.     Data Reviewed: I have personally reviewed following labs and imaging studies  CBC: Recent  Labs  Lab 01/03/17 0516 01/04/17 0530 01/05/17 0511 01/06/17 0441 01/07/17 0432  WBC 5.0 5.9 6.4 6.6 5.5  HGB 10.2* 10.1* 10.4* 10.2* 10.4*  HCT 33.8* 33.5* 35.6* 34.5* 34.3*  MCV 102.4* 103.4* 104.1* 103.3* 102.4*  PLT 158 179 197 188 235   Basic Metabolic Panel: Recent Labs  Lab 01/02/17 0538 01/03/17 0516 01/04/17 0530 01/05/17 0511 01/06/17 0441 01/07/17 0432   NA 139 141 141 143 141 142  K 3.4* 3.6 3.5 3.4* 3.8 3.6  CL 111 116* 113* 117* 115* 116*  CO2 19* 19* 18* 19* 18* 19*  GLUCOSE 131* 120* 106* 106* 132* 108*  BUN 14 14 19 18  22* 17  CREATININE 1.18* 1.25* 1.11* 1.06* 1.22* 1.07*  CALCIUM 8.4* 8.8* 8.4* 8.8* 8.8* 8.8*  MG 2.4 2.1  --   --   --   --    GFR: Estimated Creatinine Clearance: 40.9 mL/min (A) (by C-G formula based on SCr of 1.07 mg/dL (H)). Liver Function Tests: No results for input(s): AST, ALT, ALKPHOS, BILITOT, PROT, ALBUMIN in the last 168 hours. No results for input(s): LIPASE, AMYLASE in the last 168 hours. No results for input(s): AMMONIA in the last 168 hours. Coagulation Profile: No results for input(s): INR, PROTIME in the last 168 hours. Cardiac Enzymes: No results for input(s): CKTOTAL, CKMB, CKMBINDEX, TROPONINI in the last 168 hours. BNP (last 3 results) No results for input(s): PROBNP in the last 8760 hours. HbA1C: No results for input(s): HGBA1C in the last 72 hours. CBG: Recent Labs  Lab 01/07/17 1150 01/07/17 1629 01/07/17 2147 01/08/17 0716 01/08/17 1209  GLUCAP 109* 101* 140* 114* 156*   Lipid Profile: No results for input(s): CHOL, HDL, LDLCALC, TRIG, CHOLHDL, LDLDIRECT in the last 72 hours. Thyroid Function Tests: No results for input(s): TSH, T4TOTAL, FREET4, T3FREE, THYROIDAB in the last 72 hours. Anemia Panel: No results for input(s): VITAMINB12, FOLATE, FERRITIN, TIBC, IRON, RETICCTPCT in the last 72 hours. Sepsis Labs: No results for input(s): PROCALCITON, LATICACIDVEN in the last 168 hours.  Recent Results (from the past 240 hour(s))  C difficile quick scan w PCR reflex     Status: None   Collection Time: 01/02/17 12:51 PM  Result Value Ref Range Status   C Diff antigen NEGATIVE NEGATIVE Final   C Diff toxin NEGATIVE NEGATIVE Final   C Diff interpretation No C. difficile detected.  Final  Gastrointestinal Panel by PCR , Stool     Status: None   Collection Time: 01/02/17 12:51  PM  Result Value Ref Range Status   Campylobacter species NOT DETECTED NOT DETECTED Final   Plesimonas shigelloides NOT DETECTED NOT DETECTED Final   Salmonella species NOT DETECTED NOT DETECTED Final   Yersinia enterocolitica NOT DETECTED NOT DETECTED Final   Vibrio species NOT DETECTED NOT DETECTED Final   Vibrio cholerae NOT DETECTED NOT DETECTED Final   Enteroaggregative E coli (EAEC) NOT DETECTED NOT DETECTED Final   Enteropathogenic E coli (EPEC) NOT DETECTED NOT DETECTED Final   Enterotoxigenic E coli (ETEC) NOT DETECTED NOT DETECTED Final   Shiga like toxin producing E coli (STEC) NOT DETECTED NOT DETECTED Final   Shigella/Enteroinvasive E coli (EIEC) NOT DETECTED NOT DETECTED Final   Cryptosporidium NOT DETECTED NOT DETECTED Final   Cyclospora cayetanensis NOT DETECTED NOT DETECTED Final   Entamoeba histolytica NOT DETECTED NOT DETECTED Final   Giardia lamblia NOT DETECTED NOT DETECTED Final   Adenovirus F40/41 NOT DETECTED NOT DETECTED Final   Astrovirus NOT DETECTED NOT  DETECTED Final   Norovirus GI/GII NOT DETECTED NOT DETECTED Final   Rotavirus A NOT DETECTED NOT DETECTED Final   Sapovirus (I, II, IV, and V) NOT DETECTED NOT DETECTED Final         Radiology Studies: Dg Esophagus  Result Date: 01/06/2017 CLINICAL DATA:  83 year old female with dysphagia. Initial encounter. EXAM: ESOPHOGRAM/BARIUM SWALLOW TECHNIQUE: Single contrast examination was performed using thin barium. FLUOROSCOPY TIME:  Fluoroscopy Time:  48 seconds Radiation Exposure Index:  6 mGy COMPARISON:  None. FINDINGS: Present examination is limited by the fact patient was not able to change position, ingest any significant amount of barium or ingest a barium tablet. Marked presbyesophagus. Moderate-size hiatal hernia. No significant obstructing lesion detected on this limited exam. No aspiration noted during exam. IMPRESSION: Limited exam. Marked presbyesophagus. Moderate-size hiatal hernia. Electronically  Signed   By: Genia Del M.D.   On: 01/06/2017 16:39        Scheduled Meds: . chlorhexidine  15 mL Mouth Rinse BID  . cholestyramine  4 g Oral BID  . dextromethorphan-guaiFENesin  1 tablet Oral BID  . diltiazem  60 mg Oral Q6H  . insulin aspart  0-15 Units Subcutaneous TID WC  . insulin aspart  0-5 Units Subcutaneous QHS  . loratadine  10 mg Oral Daily  . losartan  50 mg Oral Daily  . mouth rinse  15 mL Mouth Rinse q12n4p  . metoprolol tartrate  100 mg Oral BID  . pantoprazole  40 mg Oral BID AC  . rivaroxaban   Does not apply Once  . rivaroxaban  15 mg Oral Q supper   Continuous Infusions:    LOS: 8 days    Time spent: 35 mins    Domenic Polite, MD [facility] Page via [email or web address] password [email or web address]  If 7PM-7AM, please contact night-coverage [email or web address] Password [email or web address] 01/08/2017, 1:16 PM

## 2017-01-08 NOTE — Progress Notes (Signed)
Physical Therapy Treatment Patient Details Name: Kendra Maldonado MRN: [id number] DOB: 07-18-1933 Today's Date: 01/08/2017    History of Present Illness 83 y.o. 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    PT Comments    Pt OOB in recliner.  Assisted to [facility] to void then amb in hallway using rollator.  Good steady  Gait.  Avg HR with activity 109.  Dyspnea 1/4     Tolerated well.   Follow Up Recommendations  SNF;Supervision for mobility/OOB     Equipment Recommendations  None recommended by PT    Recommendations for Other Services       Precautions / Restrictions Precautions Precautions: Fall Precaution Comments: monitor HR Restrictions Weight Bearing Restrictions: No    Mobility  Bed Mobility               General bed mobility comments: OOB in recliner   Transfers Overall transfer level: Needs assistance Equipment used: Rolling walker (2 wheeled) Transfers: Sit to/from Stand Sit to Stand: Supervision         General transfer comment: good use of hands to staedy self and good turn completion  Ambulation/Gait Ambulation/Gait assistance: Supervision Ambulation Distance (Feet): 110 Feet Assistive device: 4-wheeled walker Gait Pattern/deviations: Step-through pattern;Decreased stride length     General Gait Details: amb with rollator this session.  No LOB   functional distance and speed    avg HR 109   Stairs            Wheelchair Mobility    Modified Rankin (Stroke Patients Only)       Balance                                            Cognition Arousal/Alertness: Awake/alert Behavior During Therapy:  WFL for tasks assessed/performed Overall Cognitive Status: Within Functional Limits for tasks assessed                                 General Comments: pleasant      Exercises      General Comments        Pertinent Vitals/Pain Pain Assessment: No/denies pain    Home Living                      Prior Function            PT Goals (current goals can now be found in the care plan section) Progress towards PT goals: Progressing toward goals    Frequency    Min 3X/week      PT Plan Current plan remains appropriate    Co-evaluation              AM-PAC PT "6 Clicks" Daily Activity  Outcome Measure  Difficulty turning over in bed (including adjusting bedclothes, sheets and blankets)?: None Difficulty moving from lying on back to sitting on the side of the bed? : A Little Difficulty sitting down on and standing up from a chair with arms (e.g., wheelchair, bedside commode,  etc,.)?: A Little Help needed moving to and from a bed to chair (including a wheelchair)?: A Little Help needed walking in hospital room?: A Little Help needed climbing 3-5 steps with a railing? : A Lot 6 Click Score: 18    End of Session Equipment Utilized During Treatment: Gait belt Activity Tolerance: Patient tolerated treatment well Patient left: in chair;with call bell/phone within reach Nurse Communication: Mobility status PT Visit Diagnosis: Difficulty in walking, not elsewhere classified (R26.2)     Time: 9611-6435 PT Time Calculation (min) (ACUTE ONLY): 26 min  Charges:  $Gait Training: 8-22 mins $Therapeutic Activity: 8-22 mins                    G Codes:       Rica Koyanagi  PTA [facility]  Acute  Rehab Pager      [phone]

## 2017-01-08 NOTE — Progress Notes (Signed)
Occupational Therapy Treatment Patient Details Name: Kendra Maldonado MRN: [id number] DOB: November 12, 1933 Today's Date: 01/08/2017    History of present illness 83 y.o. 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   OT comments  Pt making progress toward goals  Follow Up Recommendations  SNF    Equipment Recommendations  None recommended by OT       Precautions / Restrictions Precautions Precautions: Fall Precaution Comments: monitor HR Restrictions Weight Bearing Restrictions: No       Mobility Bed Mobility               General bed mobility comments: OOB in recliner   Transfers Overall transfer level: Needs assistance Equipment used: Rolling walker (2 wheeled) Transfers: Sit to/from Stand Sit to Stand: Supervision;Min guard Stand pivot transfers: Min guard       General transfer comment: good use of hands to staedy self and good turn completion    Balance Overall balance assessment: Needs assistance         Standing balance support: Bilateral upper extremity supported Standing balance-Leahy Scale: Poor Standing balance comment: requires UE support, denies recent falls                           ADL either performed or assessed with clinical judgement   ADL Overall ADL's : Needs assistance/impaired Eating/Feeding: Set up;Sitting   Grooming: Sitting;Set up                   Toilet Transfer: Min guard;RW;BSC   Toileting- Clothing Manipulation and Hygiene: Minimal assistance;Sit to/from stand;Cueing for safety;Cueing for sequencing         General ADL Comments: pt agreeable to rehab      Vision Patient Visual  Report: No change from baseline            Cognition Arousal/Alertness: Awake/alert Behavior During Therapy: WFL for tasks assessed/performed Overall Cognitive Status: Within Functional Limits for tasks assessed                                 General Comments: pleasant                   Pertinent Vitals/ Pain       Pain Assessment: No/denies pain Pain Score: 0-No pain         Frequency           Progress Toward Goals  OT Goals(current goals can now be found in the care plan section)  Progress towards OT goals: Progressing toward goals         Co-evaluation                 AM-PAC PT "6 Clicks" Daily Activity     Outcome Measure   Help from another person eating meals?: None Help from another person taking care of personal grooming?: A Little Help from another person toileting, which includes using toliet, bedpan, or urinal?: A Little Help from another person bathing (including washing, rinsing, drying)?: A Little Help from another person to put on and taking off regular upper body clothing?:  A Little Help from another person to put on and taking off regular lower body clothing?: A Little 6 Click Score: 19    End of Session Equipment Utilized During Treatment: Rolling walker  OT Visit Diagnosis: Unsteadiness on feet (R26.81);Muscle weakness (generalized) (M62.81)   Activity Tolerance Patient limited by fatigue   Patient Left in chair;with call bell/phone within reach;with family/visitor present   Nurse Communication Mobility status        Time: 1353-1405 OT Time Calculation (min): 12 min  Charges: OT General Charges $OT Visit: 1 Visit OT Treatments $Self Care/Home Management : 8-22 mins  Kari Baars, [place]   Payton Mccallum D 01/08/2017, 2:08 PM

## 2017-01-08 NOTE — Progress Notes (Signed)
CSW following patient for discharge to SNF. Patient not medically stable, CSW provided update to patient's selected SNF. CSW will continue to follow for assistance with discharge planning.   Kendra Maldonado, [place] Social Worker [facility] Cell#: [phone]

## 2017-01-09 ENCOUNTER — Inpatient Hospital Stay (HOSPITAL_COMMUNITY): Payer: Medicare Other

## 2017-01-09 DIAGNOSIS — M7989 Other specified soft tissue disorders: Secondary | ICD-10-CM

## 2017-01-09 LAB — BASIC METABOLIC PANEL
Anion gap: 7 (ref 5–15)
BUN: 15 mg/dL (ref 6–20)
CALCIUM: 8.6 mg/dL — AB (ref 8.9–10.3)
CO2: 19 mmol/L — ABNORMAL LOW (ref 22–32)
CREATININE: 1.41 mg/dL — AB (ref 0.44–1.00)
Chloride: 113 mmol/L — ABNORMAL HIGH (ref 101–111)
GFR calc non Af Amer: 34 mL/min — ABNORMAL LOW (ref >60)
GFR, EST AFRICAN AMERICAN: 39 mL/min — AB (ref >60)
Glucose, Bld: 131 mg/dL — ABNORMAL HIGH (ref 65–99)
Potassium: 4.4 mmol/L (ref 3.5–5.1)
SODIUM: 139 mmol/L (ref 135–145)

## 2017-01-09 LAB — CBC
HCT: 34.8 % — ABNORMAL LOW (ref 36.0–46.0)
Hemoglobin: 10.7 g/dL — ABNORMAL LOW (ref 12.0–15.0)
MCH: 31.6 pg (ref 26.0–34.0)
MCHC: 30.7 g/dL (ref 30.0–36.0)
MCV: 102.7 fL — ABNORMAL HIGH (ref 78.0–100.0)
PLATELETS: 198 10*3/uL (ref 150–400)
RBC: 3.39 MIL/uL — ABNORMAL LOW (ref 3.87–5.11)
RDW: 14.1 % (ref 11.5–15.5)
WBC: 5.8 10*3/uL (ref 4.0–10.5)

## 2017-01-09 LAB — GLUCOSE, CAPILLARY: GLUCOSE-CAPILLARY: 130 mg/dL — AB (ref 65–99)

## 2017-01-09 MED ORDER — LORAZEPAM 0.5 MG PO TABS: 0.5000 mg | ORAL_TABLET | Freq: Two times a day (BID) | ORAL | 0 refills | Status: DC | PRN

## 2017-01-09 MED ORDER — METOPROLOL TARTRATE 100 MG PO TABS: 100.0000 mg | ORAL_TABLET | Freq: Two times a day (BID) | ORAL | 0 refills | Status: DC

## 2017-01-09 MED ORDER — OMEPRAZOLE 20 MG PO CPDR: 20.0000 mg | DELAYED_RELEASE_CAPSULE | Freq: Two times a day (BID) | ORAL | Status: DC

## 2017-01-09 MED ORDER — CHOLESTYRAMINE 4 G PO PACK: 4.0000 g | PACK | Freq: Two times a day (BID) | ORAL | 0 refills | Status: DC

## 2017-01-09 MED ORDER — CHOLESTYRAMINE 4 G PO PACK
4.0000 g | PACK | Freq: Two times a day (BID) | ORAL | Status: DC
  Administered 2017-01-09: 4 g via ORAL
  Filled 2017-01-09 (×2): qty 1

## 2017-01-09 MED ORDER — DILTIAZEM HCL ER COATED BEADS 240 MG PO CP24: 240.0000 mg | ORAL_CAPSULE | Freq: Every day | ORAL | 0 refills | Status: DC

## 2017-01-09 MED ORDER — DIPHENOXYLATE-ATROPINE 2.5-0.025 MG PO TABS: 2.0000 | ORAL_TABLET | Freq: Every morning | ORAL | 0 refills | Status: DC

## 2017-01-09 MED ORDER — RIVAROXABAN 15 MG PO TABS: 15.0000 mg | ORAL_TABLET | Freq: Every day | ORAL | 0 refills | Status: DC

## 2017-01-09 MED ORDER — TRAMADOL HCL 50 MG PO TABS: 50.0000 mg | ORAL_TABLET | Freq: Two times a day (BID) | ORAL | 0 refills | Status: DC | PRN

## 2017-01-09 NOTE — Progress Notes (Signed)
Report called to [place] at [facility].  All questions answered.  VSS.  Paperwork given to daughter-in-law to give to RN at [facility].  PT transported via family.

## 2017-01-09 NOTE — Discharge Summary (Signed)
Physician Discharge Summary  Kendra Maldonado DXA:[id number] DOB: 11-Sep-1933 DOA: 12/31/2016  PCP: Lavone Orn, MD  Admit date: 12/31/2016 Discharge date: 01/09/2017  Time spent: 45 minutes  Recommendations for Outpatient Follow-up:  1. PCP in 1 week, please check Bmet at FU 2. Cardiology AFib clinic 1/8 at 10am with Dayna Dunn 3. Caution with med timing since started on Questran    Discharge Diagnoses:  Principal Problem:   Atrial fibrillation with RVR (HCC) Active Problems:   Chronic diarrhea   Stroke ([place])   Hypertension   Type 2 diabetes mellitus with vascular disease (HCC)   HLD (hyperlipidemia)   Cerebrovascular accident (CVA) due to occlusion of right cerebellar artery (HCC)   Diarrhea   Rapid atrial fibrillation (HCC)   Iron deficiency anemia   Dysphagia   Discharge Condition: stable  Diet recommendation: low sodium heart healthy  Filed Weights   12/31/16 1408  Weight: 72.6 kg (160 lb)    History of present illness:  83 year old female history of prior CVA, diabetes, hypertension, history of hernia, chronic diarrhea brought to the ED by family members due to increased weakness.  Patient noted to be in A. fib with RVR   Hospital Course:  New onset A. fib with RVR/ 2-2.5 sec pauses  -started on high doses of metoprolol and was on cardizem gtt this and\mission -HR improving, now on cardizem Po, changed to cardizem CD -followed by cardiology this admission -chads2vasc score is 8, started on Xarelto this admission -FU with EP in Afib clinic  Chronic persistent diarrhea Patient states he has had persistent diarrhea since cholecystectomy around when she was age 85.  Patient states she is followed up with GI in the outpatient setting  - Patient states her stools are both loose and soft. Started on lomotil and questran this admission, some improvement noted, infectious workup negative -advised lomotil atleast once or BID PRN, hold if she becomes contiapted -  Outpatient follow-up with GI. -improved on above regimen   Dysphagia Patient with complaints of dysphagia states she does have troubling swallowing food and feels the food gets stuck occasionally, history of hiatal hernia.  Continue PPI -seen by Howie Ill, workup noted, esophagram with presbyesophagus and hiatal hernia -supportive care recommended, continue PPI  Iron deficiency anemia Hemoglobin currently stable at 10.2.  No signs of bleeding.  Anemia panel consistent with iron deficiency anemia. s/p IV Feraheme x1.  Likely benefit from oral iron supplementation on discharge.  -hold iron now with GI symptoms  Hypertension -stable on metop and cardizem, stopped ARB  Diabetes mellitus type 2 -Hemoglobin A1c 5.6  -stopped metformin due to chronic diarrhea     Consultations:  Cardiology  [facility] [facility]  Discharge Exam: Vitals:   01/08/17 2029 01/09/17 0535  BP: 124/76 133/84  Pulse: 98 85  Resp: 20 20  Temp: 98.2 F (36.8 C) 97.7 F (36.5 C)  SpO2: 97% 98%    General: AAOx3 Cardiovascular: S1S2/RRR Respiratory: CTAB  Discharge Instructions   Discharge Instructions    Amb referral to AFIB Clinic   Complete by:  As directed    Diet - low sodium heart healthy   Complete by:  As directed    Diet Carb Modified   Complete by:  As directed    Increase activity slowly   Complete by:  As directed      Allergies as of 01/09/2017      Reactions   Tramadol    DIZZY, "WENT OUT"   Codeine Rash  Demerol [meperidine] Rash      Medication List    STOP taking these medications   aspirin 81 MG EC tablet   cephALEXin 500 MG capsule Commonly known as:  KEFLEX   clopidogrel 75 MG tablet Commonly known as:  PLAVIX   ferrous sulfate 325 (65 FE) MG tablet   losartan 100 MG tablet Commonly known as:  COZAAR   metFORMIN 500 MG tablet Commonly known as:  GLUCOPHAGE     TAKE these medications   acetaminophen 500 MG tablet Commonly known as:  TYLENOL Take 1,000 mg  by mouth every 6 (six) hours as needed for mild pain.   atorvastatin 40 MG tablet Commonly known as:  LIPITOR Take 1 tablet (40 mg total) by mouth daily at 6 PM.   cholestyramine 4 g packet Commonly known as:  QUESTRAN Take 1 packet (4 g total) by mouth 2 (two) times daily. Take other meds 1 hour before or 4-6 hours after cholestyramine   D 2000 2000 units Tabs Generic drug:  Cholecalciferol Take 2,000 Units by mouth daily.   diclofenac sodium 1 % Gel Commonly known as:  VOLTAREN Apply 1 application topically 3 (three) times daily as needed for pain.   diltiazem 240 MG 24 hr capsule Commonly known as:  CARDIZEM CD Take 1 capsule (240 mg total) by mouth daily.   diphenoxylate-atropine 2.5-0.025 MG tablet Commonly known as:  LOMOTIL Take 2 tablets by mouth every morning. Take an additional dose as needed for chronic diarrhea, if constipated please hold dose What changed:    when to take this  reasons to take this  additional instructions   LORazepam 0.5 MG tablet Commonly known as:  ATIVAN Take 1 tablet (0.5 mg total) by mouth 2 (two) times daily as needed for anxiety.   metoprolol tartrate 100 MG tablet Commonly known as:  LOPRESSOR Take 1 tablet (100 mg total) by mouth 2 (two) times daily. What changed:    medication strength  how much to take   omeprazole 20 MG capsule Commonly known as:  PRILOSEC Take 1 capsule (20 mg total) by mouth 2 (two) times daily before a meal. What changed:  when to take this   Rivaroxaban 15 MG Tabs tablet Commonly known as:  XARELTO Take 1 tablet (15 mg total) by mouth daily with supper.   traMADol 50 MG tablet Commonly known as:  ULTRAM Take 1 tablet (50 mg total) by mouth every 12 (twelve) hours as needed for moderate pain. What changed:    when to take this  reasons to take this      Allergies  Allergen Reactions  . Tramadol     DIZZY, "WENT OUT"  . Codeine Rash  . Demerol [Meperidine] Rash    Contact information  for follow-up providers    Dunn, Dayna N, PA-C Follow up.   Specialties:  Cardiology, Radiology Why:  On January 8th at 10:00 for cardiology hospital follow up.  Contact information: [address] [place] [address] [phone]        Lavone Orn, MD. Schedule an appointment as soon as possible for a visit in 1 week(s).   Specialty:  Internal Medicine Contact information: [address] [place] [address] [phone]            Contact information for after-discharge care    Destination    [facility] Follow up.   Service:  Skilled Nursing Contact information: [place] [phone]                   The results of significant diagnostics from this hospitalization (including imaging, microbiology, ancillary and laboratory) are listed below for reference.    Significant Diagnostic Studies: Dg Chest 2 View  Result Date: 01/03/2017 CLINICAL DATA:  Atrial fibrillation.  History of breast cancer. EXAM: CHEST  2 VIEW COMPARISON:  12/31/2016 FINDINGS: Increased densities at both lung bases and costophrenic angles. Findings are suggestive for pleural effusions. There is a very large hiatal hernia containing gas. Heart size is upper limits of normal but unchanged. Surgical clips in the left axilla. Upper lungs are clear without pulmonary edema. IMPRESSION: Basilar chest densities are suggestive for small bilateral pleural effusions. Large hiatal hernia. Electronically Signed   By: Markus Daft M.D.   On: 01/03/2017 16:35   Dg Esophagus  Result Date: 01/06/2017 CLINICAL DATA:  83 year old female with dysphagia. Initial encounter. EXAM: ESOPHOGRAM/BARIUM SWALLOW TECHNIQUE: Single contrast examination was performed using thin barium. FLUOROSCOPY TIME:  Fluoroscopy Time:  48 seconds Radiation Exposure Index:  6 mGy COMPARISON:  None. FINDINGS: Present examination is  limited by the fact patient was not able to change position, ingest any significant amount of barium or ingest a barium tablet. Marked presbyesophagus. Moderate-size hiatal hernia. No significant obstructing lesion detected on this limited exam. No aspiration noted during exam. IMPRESSION: Limited exam. Marked presbyesophagus. Moderate-size hiatal hernia. Electronically Signed   By: Genia Del M.D.   On: 01/06/2017 16:39   Dg Abd Acute W/chest  Result Date: 12/31/2016 CLINICAL DATA:  Weakness and abdominal pain EXAM: DG ABDOMEN ACUTE W/ 1V CHEST COMPARISON:  Chest x-ray from 12/09/2013, pelvis MRI from 02/23/2016. FINDINGS: Cardiac shadow is mildly enlarged. Postsurgical changes are noted in the left breast. The lungs are well aerated without focal infiltrate or sizable effusion. No free air is noted. Scattered large and small bowel gas is seen. No obstructive changes are noted. Bony changes are noted in the left hemipelvis consistent with Paget's disease stable from the prior exams. No acute soft tissue abnormality is noted. IMPRESSION: Chronic changes without acute abnormality. Electronically Signed   By: Inez Catalina M.D.   On: 12/31/2016 12:33   Xr C-arm No Report  Result Date: 12/23/2016 Please see Notes or Procedures tab for imaging impression.   Microbiology: Recent Results (from the past 240 hour(s))  C difficile quick scan w PCR reflex     Status: None   Collection Time: 01/02/17 12:51 PM  Result Value Ref Range Status   C Diff antigen NEGATIVE NEGATIVE Final   C Diff toxin NEGATIVE NEGATIVE Final   C Diff interpretation No C. difficile detected.  Final  Gastrointestinal Panel by PCR , Stool     Status: None   Collection Time: 01/02/17 12:51 PM  Result Value Ref Range Status   Campylobacter species NOT DETECTED NOT DETECTED Final   Plesimonas shigelloides NOT DETECTED NOT DETECTED Final   Salmonella species NOT DETECTED NOT DETECTED Final   Yersinia enterocolitica NOT DETECTED  NOT DETECTED Final   Vibrio species NOT DETECTED NOT DETECTED Final   Vibrio cholerae NOT DETECTED NOT DETECTED Final   Enteroaggregative E coli (EAEC) NOT DETECTED NOT DETECTED Final   Enteropathogenic E coli (EPEC) NOT DETECTED NOT DETECTED Final   Enterotoxigenic E coli (ETEC) NOT DETECTED NOT DETECTED Final   Shiga like toxin producing E coli (STEC) NOT DETECTED NOT DETECTED Final   Shigella/Enteroinvasive E coli (  EIEC) NOT DETECTED NOT DETECTED Final   Cryptosporidium NOT DETECTED NOT DETECTED Final   Cyclospora cayetanensis NOT DETECTED NOT DETECTED Final   Entamoeba histolytica NOT DETECTED NOT DETECTED Final   Giardia lamblia NOT DETECTED NOT DETECTED Final   Adenovirus F40/41 NOT DETECTED NOT DETECTED Final   Astrovirus NOT DETECTED NOT DETECTED Final   Norovirus GI/GII NOT DETECTED NOT DETECTED Final   Rotavirus A NOT DETECTED NOT DETECTED Final   Sapovirus (I, II, IV, and V) NOT DETECTED NOT DETECTED Final     Labs: Basic Metabolic Panel: Recent Labs  Lab 01/03/17 0516 01/04/17 0530 01/05/17 0511 01/06/17 0441 01/07/17 0432 01/09/17 0510  NA 141 141 143 141 142 139  K 3.6 3.5 3.4* 3.8 3.6 4.4  CL 116* 113* 117* 115* 116* 113*  CO2 19* 18* 19* 18* 19* 19*  GLUCOSE 120* 106* 106* 132* 108* 131*  BUN 14 19 18  22* 17 15  CREATININE 1.25* 1.11* 1.06* 1.22* 1.07* 1.41*  CALCIUM 8.8* 8.4* 8.8* 8.8* 8.8* 8.6*  MG 2.1  --   --   --   --   --    Liver Function Tests: No results for input(s): AST, ALT, ALKPHOS, BILITOT, PROT, ALBUMIN in the last 168 hours. No results for input(s): LIPASE, AMYLASE in the last 168 hours. No results for input(s): AMMONIA in the last 168 hours. CBC: Recent Labs  Lab 01/04/17 0530 01/05/17 0511 01/06/17 0441 01/07/17 0432 01/09/17 0510  WBC 5.9 6.4 6.6 5.5 5.8  HGB 10.1* 10.4* 10.2* 10.4* 10.7*  HCT 33.5* 35.6* 34.5* 34.3* 34.8*  MCV 103.4* 104.1* 103.3* 102.4* 102.7*  PLT 179 197 188 195 198   Cardiac Enzymes: No results for  input(s): CKTOTAL, CKMB, CKMBINDEX, TROPONINI in the last 168 hours. BNP: BNP (last 3 results) No results for input(s): BNP in the last 8760 hours.  ProBNP (last 3 results) No results for input(s): PROBNP in the last 8760 hours.  CBG: Recent Labs  Lab 01/08/17 0716 01/08/17 1209 01/08/17 1622 01/08/17 2038 01/09/17 0729  GLUCAP 114* 156* 96 139* 130*       Signed:  Domenic Polite MD.  [facility] 01/09/2017, 10:57 AM

## 2017-01-09 NOTE — Progress Notes (Signed)
Pt had 2.29 sec pause at 0027 and a 2.75 pause at 0147. Pt asymptomatic at this time. Lamar Blinks NP notified. No new orders at this time. Will continue to monitor.

## 2017-01-09 NOTE — Clinical Social Work Placement (Signed)
Patient received and accepted bed offer at [facility]. Facility aware of patient's discharge and confirmed patient's bed offer. Patient's family transporting patient to SNF. Patient's RN can call report to [phone] Room 103B, packet complete. CSW signing off, no other needs identified.  CLINICAL SOCIAL WORK PLACEMENT  NOTE  Date:  01/09/2017  Patient Details  Name: Kendra Maldonado MRN: [id number] Date of Birth: March 01, 1933  Clinical Social Work is seeking post-discharge placement for this patient at the [place] level of care (*CSW will initial, date and re-position this form in  chart as items are completed):  Yes   Patient/family provided with [employer] Work Department's list of facilities offering this level of care within the geographic area requested by the patient (or if unable, by the patient's family).  Yes   Patient/family informed of their freedom to choose among providers that offer the needed level of care, that participate in Medicare, Medicaid or managed care program needed by the patient, have an available bed and are willing to accept the patient.  Yes   Patient/family informed of [facility]'s ownership interest in [facility] and [facility], as well as of the fact that they are under no obligation to receive care at these facilities.  PASRR submitted to EDS on 01/02/17     PASRR number received on 01/02/17     Existing PASRR number confirmed on       FL2 transmitted to all facilities in geographic area requested by pt/family on 01/02/17     FL2 transmitted to all facilities within larger geographic area on       Patient informed that his/her managed care company has contracts with or will negotiate with certain facilities, including the following:        Yes   Patient/family informed of bed offers received.  Patient chooses bed at [place], [place]     Physician recommends and patient chooses bed at        Patient to be transferred to [facility] on 01/09/17.  Patient to be transferred to facility by Family     Patient family notified on 01/09/17 of transfer.  Name of family member notified:  Verne Carrow     PHYSICIAN       Additional Comment:    _______________________________________________ Burnis Medin, LCSW 01/09/2017, 12:16 PM

## 2017-01-09 NOTE — Progress Notes (Signed)
Left lower extremity venous duplex has been completed. Negative for DVT.  01/09/17 10:24 AM Kendra Maldonado RVT

## 2017-01-10 DIAGNOSIS — IMO0002 Reserved for concepts with insufficient information to code with codable children: Secondary | ICD-10-CM | POA: Insufficient documentation

## 2017-01-10 DIAGNOSIS — I10 Essential (primary) hypertension: Secondary | ICD-10-CM | POA: Insufficient documentation

## 2017-01-10 DIAGNOSIS — E1165 Type 2 diabetes mellitus with hyperglycemia: Secondary | ICD-10-CM | POA: Insufficient documentation

## 2017-01-10 DIAGNOSIS — M889 Osteitis deformans of unspecified bone: Secondary | ICD-10-CM | POA: Insufficient documentation

## 2017-01-10 DIAGNOSIS — M4800 Spinal stenosis, site unspecified: Secondary | ICD-10-CM | POA: Insufficient documentation

## 2017-01-10 DIAGNOSIS — R911 Solitary pulmonary nodule: Secondary | ICD-10-CM | POA: Insufficient documentation

## 2017-01-10 DIAGNOSIS — I679 Cerebrovascular disease, unspecified: Secondary | ICD-10-CM | POA: Insufficient documentation

## 2017-01-10 DIAGNOSIS — K449 Diaphragmatic hernia without obstruction or gangrene: Secondary | ICD-10-CM | POA: Insufficient documentation

## 2017-01-10 DIAGNOSIS — N182 Chronic kidney disease, stage 2 (mild): Secondary | ICD-10-CM | POA: Insufficient documentation

## 2017-01-27 ENCOUNTER — Encounter: Payer: Self-pay | Admitting: Physician Assistant

## 2017-01-27 DIAGNOSIS — I4819 Other persistent atrial fibrillation: Secondary | ICD-10-CM | POA: Insufficient documentation

## 2017-01-27 NOTE — Progress Notes (Deleted)
Cardiology Office Note    Date:  01/27/2017  ID:  Thomes Cake, DOB Dec 14, 1933, MRN [id number] PCP:  Lavone Orn, MD  Cardiologist:  Dr. Meda Coffee   Chief Complaint: f/u atrial fib  History of Present Illness:  Kendra Maldonado is a 84 y.o. 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  Prior to recent hospitalization, Kendra Maldonado has no prior cardiac history. I met her daughter-in-law Kendra Maldonado in clinic earlier this year for eval of atypical chest pain. The patient does have a history of significant GIB/anemia with Hgb 5 in 11/2013 suspected to be due to upper GI bleeding. 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 There was no obvious source of bleeding discovered. The CT also showed a lung nodule. In 03/2015 she was admitted with acute cerebellar stroke with high grade proximal right posterior inferior cerebellar artery stenosis seen on MRA corresponding with distribution. Neurology recommended aspirin and Plavix for 3 months followed by Plavix alone given h/o GIB. She also had an episode of SVT reportedly during that admission with event monitor recommended but do not see this occurred. Earlier this year in 01/2016 she was evaluated by ortho for concern for myeloma given recurrent anemia with Hgb 7.4; later notes indicate this was felt to be chronic Paget's disease of the pelvis. She saw heme/onc and was felt to have MGUS. BMB not pursued given her elderly age thus surveillance was recommended.  She was admitted 12/2016 to [facility] for concern for increased weakness and possible inability to care for self living home  alone. She has been struggling with worsening back pain and knee pain as well as persistently chronic diarrhea. She had been avoiding food recently because it exacerbated her diarrhea. She was incidentally noted to be in atrial fib RVR with neg troponin and normal TSH. 2D echo this admission showed EF 55-60%, no RWMA, moderate pulm HTN PAS 40mHg, mild TR - prior echo in 03/2015 showed grade 2 DD, severe LAE, PASP 34. CHADSVASC 8. With her GI hx and MGUS it was not clear she would be a good longterm candidate for anticoagulation but was trialed on Lovenox and tolerated this without issue, thus was transitioned to Xarelto at discharge. She was seen by GI and stated on lomotil and questran for her diarrhea; infectious w/u was negative. She was also evaluated for dysphagia with esophagram with presbyesophagus and hiatal hernia for which supportive care was recommended. Last Cr 1.41, K 4.4, Hgb 10.7.  Bmet, cbc Plan for afib   Persistent atrial fib Essential HTN Chronic anemia CKD stage III   Past Medical History:  Diagnosis Date  . Back pain   . Breast cancer, left breast ([place]   lumpectomy  . Cerebrovascular disease    a. 03/2015 - high grade proximal right posterior inferior cerebellar artery stenosis seen on MRA corresponding with distribution of stroke at that time.  . Chronic diarrhea   . CKD (chronic kidney disease), stage III ([facility]   . Diabetes mellitus type 2, uncontrolled ([place]   . Essential hypertension   . Hiatal hernia    Stenosis of the spine  . Iron deficiency anemia 01/02/2017  . Lung  nodule   . MGUS (monoclonal gammopathy of unknown significance)   . Paget's disease of bone   . Persistent atrial fibrillation ([place])    a. dx 12/2016.  Marland Kitchen Spinal stenosis   . Stroke ([place]) 03/2015  . Symptomatic anemia   . Upper GI bleed 11/2013    Past Surgical History:  Procedure Laterality Date  . BREAST SURGERY    . CHOLECYSTECTOMY    . ESOPHAGOGASTRODUODENOSCOPY (EGD) WITH  PROPOFOL N/A 12/10/2013   Procedure: ESOPHAGOGASTRODUODENOSCOPY (EGD) WITH PROPOFOL;  Surgeon: Jeryl Columbia, MD;  Location: [facility] ENDOSCOPY;  Service: Endoscopy;  Laterality: N/A;  . HEMORRHOID SURGERY    . PARTIAL HYSTERECTOMY      Current Medications: No outpatient medications have been marked as taking for the 01/28/17 encounter (Appointment) with Charlie Pitter, PA-C.   ***   Allergies:   Tramadol; Codeine; and Demerol [meperidine]   Social History   Socioeconomic History  . Marital status: Married    Spouse name: Not on file  . Number of children: 2  . Years of education: 59  . Highest education level: Not on file  Social Needs  . Financial resource strain: Not on file  . Food insecurity - worry: Not on file  . Food insecurity - inability: Not on file  . Transportation needs - medical: Not on file  . Transportation needs - non-medical: Not on file  Occupational History  . Occupation: Retired    Comment: [employer]  Tobacco Use  . Smoking status: Never Smoker  . Smokeless tobacco: Never Used  Substance and Sexual Activity  . Alcohol use: No  . Drug use: No  . Sexual activity: Not on file  Other Topics Concern  . Not on file  Social History Narrative   Widowed, lives home alone   Has 2 sons and 4 grandchildren   Right-handed   Rarely drinks caffeine     Family History:  Family History  Problem Relation Age of Onset  . Hypertension Mother   . Dementia Father   . Cancer Brother        pancreas   ***  ROS:   Please see the history of present illness. Otherwise, review of systems is positive for ***.  All other systems are reviewed and otherwise negative.    PHYSICAL EXAM:   VS:  There were no vitals taken for this visit.  BMI: There is no height or weight on file to calculate BMI. GEN: Well nourished, well developed, in no acute distress  HEENT: normocephalic, atraumatic Neck: no JVD, carotid bruits, or masses Cardiac: ***RRR; no murmurs, rubs, or gallops,  no edema  Respiratory:  clear to auscultation bilaterally, normal work of breathing GI: soft, nontender, nondistended, + BS MS: no deformity or atrophy  Skin: warm and dry, no rash Neuro:  Alert and Oriented x 3, Strength and sensation are intact, follows commands Psych: euthymic mood, full affect  Wt Readings from Last 3 Encounters:  12/31/16 160 lb (72.6 kg)  11/05/16 170 lb (77.1 kg)  05/15/16 168 lb 1.6 oz (76.2 kg)      Studies/Labs Reviewed:   EKG:  EKG was ordered today and personally reviewed by me and demonstrates *** EKG was not ordered today.***  Recent Labs: 12/31/2016: TSH 1.543 01/01/2017: ALT 45 01/03/2017: Magnesium 2.1 01/09/2017: BUN 15; Creatinine, Ser 1.41; Hemoglobin 10.7; Platelets 198; Potassium 4.4; Sodium 139   Lipid Panel    Component Value Date/Time   CHOL 216 (H) 04/04/2015 7035  TRIG 75 04/04/2015 0721   HDL 65 04/04/2015 0721   CHOLHDL 3.3 04/04/2015 0721   VLDL 15 04/04/2015 0721   LDLCALC 136 (H) 04/04/2015 0721    Additional studies/ records that were reviewed today include: Summarized above.***    ASSESSMENT & PLAN:   1. ***  Disposition: F/u with ***   Medication Adjustments/Labs and Tests Ordered: Current medicines are reviewed at length with the patient today.  Concerns regarding medicines are outlined above. Medication changes, Labs and Tests ordered today are summarized above and listed in the Patient Instructions accessible in Encounters.   Signed, Charlie Pitter, PA-C  01/27/2017 1:19 PM    [facility] [address], [place], [place]  [address] Phone: [phone]; Fax: [phone]

## 2017-01-28 ENCOUNTER — Ambulatory Visit: Payer: Medicare Other | Admitting: Physician Assistant

## 2017-01-29 ENCOUNTER — Ambulatory Visit (INDEPENDENT_AMBULATORY_CARE_PROVIDER_SITE_OTHER): Payer: Medicare Other | Admitting: Orthopedic Surgery

## 2017-01-31 ENCOUNTER — Encounter: Payer: Self-pay | Admitting: Physician Assistant

## 2017-02-04 ENCOUNTER — Encounter: Payer: Self-pay | Admitting: Physician Assistant

## 2017-02-14 ENCOUNTER — Inpatient Hospital Stay (HOSPITAL_COMMUNITY)
Admission: EM | Admit: 2017-02-14 | Discharge: 2017-02-19 | DRG: 064 | Disposition: A | Discharge status: Skilled Nursing Facility | Payer: Medicare Other | Attending: Internal Medicine | Admitting: Internal Medicine

## 2017-02-14 ENCOUNTER — Emergency Department (HOSPITAL_COMMUNITY): Payer: Medicare Other

## 2017-02-14 ENCOUNTER — Encounter (HOSPITAL_COMMUNITY): Payer: Self-pay | Admitting: Emergency Medicine

## 2017-02-14 DIAGNOSIS — I4891 Unspecified atrial fibrillation: Secondary | ICD-10-CM

## 2017-02-14 DIAGNOSIS — I272 Pulmonary hypertension, unspecified: Secondary | ICD-10-CM | POA: Diagnosis present

## 2017-02-14 DIAGNOSIS — Z91128 Patient's intentional underdosing of medication regimen for other reason: Secondary | ICD-10-CM | POA: Diagnosis not present

## 2017-02-14 DIAGNOSIS — Z6823 Body mass index (BMI) 23.0-23.9, adult: Secondary | ICD-10-CM

## 2017-02-14 DIAGNOSIS — R402352 Coma scale, best motor response, localizes pain, at arrival to emergency department: Secondary | ICD-10-CM | POA: Diagnosis present

## 2017-02-14 DIAGNOSIS — D472 Monoclonal gammopathy: Secondary | ICD-10-CM | POA: Diagnosis present

## 2017-02-14 DIAGNOSIS — N183 Chronic kidney disease, stage 3 unspecified: Secondary | ICD-10-CM

## 2017-02-14 DIAGNOSIS — F329 Major depressive disorder, single episode, unspecified: Secondary | ICD-10-CM | POA: Diagnosis present

## 2017-02-14 DIAGNOSIS — K449 Diaphragmatic hernia without obstruction or gangrene: Secondary | ICD-10-CM | POA: Diagnosis present

## 2017-02-14 DIAGNOSIS — I159 Secondary hypertension, unspecified: Secondary | ICD-10-CM

## 2017-02-14 DIAGNOSIS — D751 Secondary polycythemia: Secondary | ICD-10-CM | POA: Diagnosis not present

## 2017-02-14 DIAGNOSIS — Z9119 Patient's noncompliance with other medical treatment and regimen: Secondary | ICD-10-CM

## 2017-02-14 DIAGNOSIS — H9190 Unspecified hearing loss, unspecified ear: Secondary | ICD-10-CM | POA: Diagnosis not present

## 2017-02-14 DIAGNOSIS — R4701 Aphasia: Secondary | ICD-10-CM | POA: Diagnosis present

## 2017-02-14 DIAGNOSIS — R402142 Coma scale, eyes open, spontaneous, at arrival to emergency department: Secondary | ICD-10-CM | POA: Diagnosis present

## 2017-02-14 DIAGNOSIS — I6310 Cerebral infarction due to embolism of unspecified precerebral artery: Secondary | ICD-10-CM | POA: Diagnosis not present

## 2017-02-14 DIAGNOSIS — R29810 Facial weakness: Secondary | ICD-10-CM | POA: Diagnosis present

## 2017-02-14 DIAGNOSIS — E876 Hypokalemia: Secondary | ICD-10-CM | POA: Diagnosis present

## 2017-02-14 DIAGNOSIS — D696 Thrombocytopenia, unspecified: Secondary | ICD-10-CM | POA: Diagnosis present

## 2017-02-14 DIAGNOSIS — R4189 Other symptoms and signs involving cognitive functions and awareness: Secondary | ICD-10-CM | POA: Diagnosis present

## 2017-02-14 DIAGNOSIS — Z8673 Personal history of transient ischemic attack (TIA), and cerebral infarction without residual deficits: Secondary | ICD-10-CM

## 2017-02-14 DIAGNOSIS — I340 Nonrheumatic mitral (valve) insufficiency: Secondary | ICD-10-CM | POA: Diagnosis not present

## 2017-02-14 DIAGNOSIS — Z810 Family history of intellectual disabilities: Secondary | ICD-10-CM | POA: Diagnosis not present

## 2017-02-14 DIAGNOSIS — G92 Toxic encephalopathy: Secondary | ICD-10-CM | POA: Diagnosis present

## 2017-02-14 DIAGNOSIS — R29714 NIHSS score 14: Secondary | ICD-10-CM | POA: Diagnosis present

## 2017-02-14 DIAGNOSIS — I6340 Cerebral infarction due to embolism of unspecified cerebral artery: Secondary | ICD-10-CM | POA: Diagnosis not present

## 2017-02-14 DIAGNOSIS — E1122 Type 2 diabetes mellitus with diabetic chronic kidney disease: Secondary | ICD-10-CM | POA: Diagnosis present

## 2017-02-14 DIAGNOSIS — I480 Paroxysmal atrial fibrillation: Secondary | ICD-10-CM | POA: Diagnosis present

## 2017-02-14 DIAGNOSIS — E1151 Type 2 diabetes mellitus with diabetic peripheral angiopathy without gangrene: Secondary | ICD-10-CM | POA: Diagnosis present

## 2017-02-14 DIAGNOSIS — Z91199 Patient's noncompliance with other medical treatment and regimen due to unspecified reason: Secondary | ICD-10-CM

## 2017-02-14 DIAGNOSIS — I63412 Cerebral infarction due to embolism of left middle cerebral artery: Secondary | ICD-10-CM | POA: Diagnosis not present

## 2017-02-14 DIAGNOSIS — R471 Dysarthria and anarthria: Secondary | ICD-10-CM | POA: Diagnosis present

## 2017-02-14 DIAGNOSIS — I639 Cerebral infarction, unspecified: Secondary | ICD-10-CM | POA: Diagnosis present

## 2017-02-14 DIAGNOSIS — I10 Essential (primary) hypertension: Secondary | ICD-10-CM

## 2017-02-14 DIAGNOSIS — F331 Major depressive disorder, recurrent, moderate: Secondary | ICD-10-CM | POA: Diagnosis not present

## 2017-02-14 DIAGNOSIS — E785 Hyperlipidemia, unspecified: Secondary | ICD-10-CM | POA: Diagnosis present

## 2017-02-14 DIAGNOSIS — E119 Type 2 diabetes mellitus without complications: Secondary | ICD-10-CM

## 2017-02-14 DIAGNOSIS — K529 Noninfective gastroenteritis and colitis, unspecified: Secondary | ICD-10-CM | POA: Diagnosis present

## 2017-02-14 DIAGNOSIS — I071 Rheumatic tricuspid insufficiency: Secondary | ICD-10-CM

## 2017-02-14 DIAGNOSIS — M889 Osteitis deformans of unspecified bone: Secondary | ICD-10-CM | POA: Diagnosis present

## 2017-02-14 DIAGNOSIS — T45516A Underdosing of anticoagulants, initial encounter: Secondary | ICD-10-CM | POA: Diagnosis present

## 2017-02-14 DIAGNOSIS — Z885 Allergy status to narcotic agent status: Secondary | ICD-10-CM

## 2017-02-14 DIAGNOSIS — I481 Persistent atrial fibrillation: Secondary | ICD-10-CM | POA: Diagnosis present

## 2017-02-14 DIAGNOSIS — Z8249 Family history of ischemic heart disease and other diseases of the circulatory system: Secondary | ICD-10-CM

## 2017-02-14 DIAGNOSIS — R630 Anorexia: Secondary | ICD-10-CM | POA: Diagnosis present

## 2017-02-14 DIAGNOSIS — F32A Depression, unspecified: Secondary | ICD-10-CM

## 2017-02-14 DIAGNOSIS — D45 Polycythemia vera: Secondary | ICD-10-CM

## 2017-02-14 DIAGNOSIS — R269 Unspecified abnormalities of gait and mobility: Secondary | ICD-10-CM | POA: Diagnosis present

## 2017-02-14 DIAGNOSIS — R627 Adult failure to thrive: Secondary | ICD-10-CM | POA: Diagnosis present

## 2017-02-14 DIAGNOSIS — I129 Hypertensive chronic kidney disease with stage 1 through stage 4 chronic kidney disease, or unspecified chronic kidney disease: Secondary | ICD-10-CM | POA: Diagnosis present

## 2017-02-14 DIAGNOSIS — Z853 Personal history of malignant neoplasm of breast: Secondary | ICD-10-CM | POA: Diagnosis not present

## 2017-02-14 DIAGNOSIS — F333 Major depressive disorder, recurrent, severe with psychotic symptoms: Secondary | ICD-10-CM | POA: Diagnosis not present

## 2017-02-14 DIAGNOSIS — R402242 Coma scale, best verbal response, confused conversation, at arrival to emergency department: Secondary | ICD-10-CM | POA: Diagnosis present

## 2017-02-14 LAB — I-STAT CHEM 8, ED
BUN: 26 mg/dL — ABNORMAL HIGH (ref 6–20)
CALCIUM ION: 1.03 mmol/L — AB (ref 1.15–1.40)
Chloride: 98 mmol/L — ABNORMAL LOW (ref 101–111)
Creatinine, Ser: 1.1 mg/dL — ABNORMAL HIGH (ref 0.44–1.00)
GLUCOSE: 91 mg/dL (ref 65–99)
HCT: 50 % — ABNORMAL HIGH (ref 36.0–46.0)
HEMOGLOBIN: 17 g/dL — AB (ref 12.0–15.0)
POTASSIUM: 3.2 mmol/L — AB (ref 3.5–5.1)
Sodium: 144 mmol/L (ref 135–145)
TCO2: 28 mmol/L (ref 22–32)

## 2017-02-14 LAB — CBC
HEMATOCRIT: 49.9 % — AB (ref 36.0–46.0)
Hemoglobin: 15.4 g/dL — ABNORMAL HIGH (ref 12.0–15.0)
MCH: 30.3 pg (ref 26.0–34.0)
MCHC: 30.9 g/dL (ref 30.0–36.0)
MCV: 98.2 fL (ref 78.0–100.0)
Platelets: 136 10*3/uL — ABNORMAL LOW (ref 150–400)
RBC: 5.08 MIL/uL (ref 3.87–5.11)
RDW: 13.5 % (ref 11.5–15.5)
WBC: 7.7 10*3/uL (ref 4.0–10.5)

## 2017-02-14 LAB — DIFFERENTIAL
BASOS PCT: 0 %
Basophils Absolute: 0 10*3/uL (ref 0.0–0.1)
Eosinophils Absolute: 0.1 10*3/uL (ref 0.0–0.7)
Eosinophils Relative: 1 %
Lymphocytes Relative: 20 %
Lymphs Abs: 1.5 10*3/uL (ref 0.7–4.0)
MONO ABS: 0.6 10*3/uL (ref 0.1–1.0)
MONOS PCT: 8 %
NEUTROS ABS: 5.5 10*3/uL (ref 1.7–7.7)
Neutrophils Relative %: 71 %

## 2017-02-14 LAB — ANTITHROMBIN III: ANTITHROMB III FUNC: 91 % (ref 75–120)

## 2017-02-14 LAB — COMPREHENSIVE METABOLIC PANEL
ALT: 12 U/L — AB (ref 14–54)
AST: 19 U/L (ref 15–41)
Albumin: 4 g/dL (ref 3.5–5.0)
Alkaline Phosphatase: 129 U/L — ABNORMAL HIGH (ref 38–126)
Anion gap: 24 — ABNORMAL HIGH (ref 5–15)
BILIRUBIN TOTAL: 1.6 mg/dL — AB (ref 0.3–1.2)
BUN: 22 mg/dL — ABNORMAL HIGH (ref 6–20)
CALCIUM: 9.2 mg/dL (ref 8.9–10.3)
CHLORIDE: 99 mmol/L — AB (ref 101–111)
CO2: 22 mmol/L (ref 22–32)
CREATININE: 1.45 mg/dL — AB (ref 0.44–1.00)
GFR, EST AFRICAN AMERICAN: 38 mL/min — AB (ref >60)
GFR, EST NON AFRICAN AMERICAN: 33 mL/min — AB (ref >60)
Glucose, Bld: 92 mg/dL (ref 65–99)
Potassium: 3.3 mmol/L — ABNORMAL LOW (ref 3.5–5.1)
Sodium: 145 mmol/L (ref 135–145)
TOTAL PROTEIN: 7.2 g/dL (ref 6.5–8.1)

## 2017-02-14 LAB — MAGNESIUM: MAGNESIUM: 2 mg/dL (ref 1.7–2.4)

## 2017-02-14 LAB — I-STAT TROPONIN, ED: TROPONIN I, POC: 0.02 ng/mL (ref 0.00–0.08)

## 2017-02-14 LAB — TROPONIN I: Troponin I: <0.03 ng/mL (ref <0.03)

## 2017-02-14 LAB — PROTIME-INR
INR: 1.15
Prothrombin Time: 14.6 seconds (ref 11.4–15.2)

## 2017-02-14 LAB — APTT: aPTT: 30 seconds (ref 24–36)

## 2017-02-14 MED ORDER — SODIUM CHLORIDE 0.9 % IV BOLUS (SEPSIS)
1000.0000 mL | Freq: Once | INTRAVENOUS | Status: AC
  Administered 2017-02-14: 1000 mL via INTRAVENOUS

## 2017-02-14 MED ORDER — ASPIRIN 81 MG PO CHEW
324.0000 mg | CHEWABLE_TABLET | Freq: Once | ORAL | Status: AC
  Administered 2017-02-14: 324 mg via ORAL
  Filled 2017-02-14: qty 4

## 2017-02-14 MED ORDER — DILTIAZEM HCL-DEXTROSE 100-5 MG/100ML-% IV SOLN (PREMIX)
5.0000 mg/h | INTRAVENOUS | Status: DC
  Administered 2017-02-14: 15 mg/h via INTRAVENOUS
  Administered 2017-02-14: 5 mg/h via INTRAVENOUS
  Administered 2017-02-15 – 2017-02-16 (×3): 10 mg/h via INTRAVENOUS
  Filled 2017-02-14 (×6): qty 100

## 2017-02-14 MED ORDER — IOPAMIDOL (ISOVUE-370) INJECTION 76%
INTRAVENOUS | Status: AC
  Administered 2017-02-14: 90 mL
  Filled 2017-02-14: qty 100

## 2017-02-14 MED ORDER — DILTIAZEM LOAD VIA INFUSION
15.0000 mg | Freq: Once | INTRAVENOUS | Status: AC
  Administered 2017-02-14: 15 mg via INTRAVENOUS
  Filled 2017-02-14: qty 15

## 2017-02-14 MED ORDER — POTASSIUM CHLORIDE 10 MEQ/100ML IV SOLN
10.0000 meq | INTRAVENOUS | Status: AC
  Administered 2017-02-14 (×3): 10 meq via INTRAVENOUS
  Filled 2017-02-14 (×3): qty 100

## 2017-02-14 MED ORDER — MAGNESIUM SULFATE 2 GM/50ML IV SOLN
2.0000 g | Freq: Once | INTRAVENOUS | Status: AC
Start: 2017-02-14 — End: 2017-02-14
  Administered 2017-02-14: 2 g via INTRAVENOUS
  Filled 2017-02-14: qty 50

## 2017-02-14 NOTE — H&P (Signed)
TRH H&P   Patient Demographics:    Kendra Maldonado, is a 84 y.o. female  MRN: [id number]   DOB - July 24, 1933  Admit Date - 02/14/2017  Outpatient Primary MD for the patient is Kendra Orn, MD  Referring MD/NP/PA: Dorise Maldonado  Outpatient Specialists:    Kendra Maldonado (neurology)  Patient coming from:   home  Chief Complaint  Patient presents with  . Code Stroke  . Aphasia  . Facial Droop    Right      HPI:    Kendra Maldonado  is a 84 y.o. female, w h/o Breast cancer, Pagets disease, Mgus, Dm2 with CKD stage 3, Pafib, CVA apparently presents with c/o difficulty with her speech, perseverating and possible some receptive aphasia as well as gait instability beginning around 1 pm, when she went to see her pcp.  Code stroke was activated.  Neurology didn't use TPA due to question of when her last xarelto dose was.  Pt's family thinks that she has not taken her xarelto in the past 3 days.    In ED,  CT  brain  IMPRESSION: 1. Left posterior inferior cerebellar nonhemorrhagic infarct is new since the prior study and may be acute. 2. Remote right cerebellar infarct. 3. No acute supratentorial infarct visible. 4. Advanced atrophy and white matter disease is stable. 5. ASPECTS is 10/10  Wbc 7.7, Hgb 15.4, Plt 136 Na 128mJ 3,337mun 22, Creatinine 1.45, Ast 19, Alt 12, Alk phos 129, T bili 1.6 Magnesium 2.0 Trop 0.02  Pt will be admitted for Afib with RVR, and new L posterior inferior cerebellar nonhemorrhagic infarct.        Review of systems:    In addition to the HPI above,  No Fever-chills, No Headache, No changes with Vision or hearing, No problems swallowing food or Liquids, No Chest pain, Cough or Shortness of Breath, No Abdominal pain, No Nausea or Vommitting, Bowel movements are regular, No Blood in stool or Urine, No dysuria, No new skin rashes or  bruises, No new joints pains-aches,  No new weakness, tingling, numbness in any extremity, No recent weight gain or loss, No polyuria, polydypsia or polyphagia, No significant Mental Stressors.  A full 10 point Review of Systems was done, except as stated above, all other Review of Systems were negative.   With Past History of the following :    Past Medical History:  Diagnosis Date  . Back pain   . Breast cancer, left breast ([facility]   lumpectomy  . Cerebrovascular disease    a. 03/2015 - high grade proximal right posterior inferior cerebellar artery stenosis seen on MRA corresponding with distribution of stroke at that time.  . Chronic diarrhea   . CKD (chronic kidney disease), stage III ([facility]  . Diabetes mellitus type 2, uncontrolled ([facility]  . Essential hypertension   . Hiatal hernia  Stenosis of the spine  . Iron deficiency anemia 01/02/2017  . Lung nodule   . MGUS (monoclonal gammopathy of unknown significance)   . Paget's disease of bone   . Persistent atrial fibrillation ([place])    a. dx 12/2016.  Marland Kitchen Spinal stenosis   . Stroke ([place]) 03/2015  . Symptomatic anemia   . Upper GI bleed 11/2013      Past Surgical History:  Procedure Laterality Date  . BREAST SURGERY    . CHOLECYSTECTOMY    . ESOPHAGOGASTRODUODENOSCOPY (EGD) WITH PROPOFOL N/A 12/10/2013   Procedure: ESOPHAGOGASTRODUODENOSCOPY (EGD) WITH PROPOFOL;  Surgeon: Jeryl Columbia, MD;  Location: [facility] ENDOSCOPY;  Service: Endoscopy;  Laterality: N/A;  . HEMORRHOID SURGERY    . PARTIAL HYSTERECTOMY        Social History:     Social History   Tobacco Use  . Smoking status: Never Smoker  . Smokeless tobacco: Never Used  Substance Use Topics  . Alcohol use: No     Lives - at home  Mobility - walks w assistance   Family History :     Family History  Problem Relation Age of Onset  . Hypertension Mother   . Dementia Father   . Cancer Brother        pancreas      Home Medications:   Prior to  Admission medications   Medication Sig Start Date End Date Taking? Authorizing Provider  Cholecalciferol (D 2000) 2000 UNITS TABS Take 2,000 Units by mouth daily.   Yes [provider]  diclofenac sodium (VOLTAREN) 1 % GEL Apply 1 application topically 3 (three) times daily as needed for pain. 12/25/16  Yes [provider]  diltiazem (CARDIZEM CD) 180 MG 24 hr capsule Take 180 mg by mouth daily.   Yes [provider]  diphenoxylate-atropine (LOMOTIL) 2.5-0.025 MG tablet Take 2 tablets by mouth every morning. Take an additional dose as needed for chronic diarrhea, if constipated please hold dose Patient taking differently: Take 2 tablets by mouth See admin instructions. Take 1 tablet (2 mg) by mouth every morning, take an additional dose as needed for chronic diarrhea.  If constipated please hold dose 01/09/17  Yes Domenic Polite, MD  escitalopram (LEXAPRO) 5 MG tablet Take 5 mg by mouth daily.   Yes [provider]  metoprolol tartrate (LOPRESSOR) 100 MG tablet Take 1 tablet (100 mg total) by mouth 2 (two) times daily. 01/09/17  Yes Domenic Polite, MD  omeprazole (PRILOSEC) 20 MG capsule Take 1 capsule (20 mg total) by mouth 2 (two) times daily before a meal. Patient taking differently: Take 20 mg by mouth 2 (two) times daily as needed (acid reflux/indigestion).  01/09/17  Yes Domenic Polite, MD  Rivaroxaban (XARELTO) 15 MG TABS tablet Take 1 tablet (15 mg total) by mouth daily with supper. 01/09/17  Yes Domenic Polite, MD  atorvastatin (LIPITOR) 40 MG tablet Take 1 tablet (40 mg total) by mouth daily at 6 PM. Patient not taking: Reported on 12/31/2016 04/06/15   Hosie Poisson, MD  cholestyramine (QUESTRAN) 4 g packet Take 1 packet (4 g total) by mouth 2 (two) times daily. Take other meds 1 hour before or 4-6 hours after cholestyramine Patient not taking: Reported on 02/14/2017 01/09/17   Domenic Polite, MD  diltiazem (CARDIZEM CD) 240 MG 24 hr capsule Take 1  capsule (240 mg total) by mouth daily. Patient not taking: Reported on 02/14/2017 01/09/17   Domenic Polite, MD  LORazepam (ATIVAN) 0.5 MG tablet Take 1  tablet (0.5 mg total) by mouth 2 (two) times daily as needed for anxiety. Patient not taking: Reported on 02/14/2017 01/09/17   Domenic Polite, MD  traMADol (ULTRAM) 50 MG tablet Take 1 tablet (50 mg total) by mouth every 12 (twelve) hours as needed for moderate pain. Patient not taking: Reported on 02/14/2017 01/09/17   Domenic Polite, MD     Allergies:     Allergies  Allergen Reactions  . Tramadol Other (See Comments)    dizziness, "WENT OUT"  . Codeine Rash  . Demerol [Meperidine] Rash     Physical Exam:   Vitals  Blood pressure (!) 145/94, pulse (!) 101, temperature (!) 97.3 F (36.3 C), resp. rate 17, SpO2 98 %.   1. General  lying in bed in NAD,   2. Normal affect and insight, Not Suicidal or Homicidal, Awake Alert, Oriented X 3. Pt repeats her self, perseverating, may have component of receptive aphasia.  Motor 5/5 in all 4 ext,  Can't do finger to nose.  Reflexes 2+ symmetric, diffuse with downgoing toe on the left and upgoing toe on the right Seems to ignore left side.   4. Ears and Eyes appear Normal, Conjunctivae clear, PERRLA. Moist Oral Mucosa.  5. Supple Neck, No JVD, No cervical lymphadenopathy appriciated, No Carotid Bruits.  6. Symmetrical Chest wall movement, Good air movement bilaterally, CTAB.  7.irr irr, s1, s2, no m/g/r  8. Positive Bowel Sounds, Abdomen Soft, No tenderness, No organomegaly appriciated,No rebound -guarding or rigidity.  9.  No Cyanosis, Normal Skin Turgor, No Skin Rash or Bruise.  10. Good muscle tone,  joints appear normal , no effusions, Normal ROM.  11. No Palpable Lymph Nodes in Neck or Axillae   Data Review:    CBC Recent Labs  Lab 02/14/17 1500 02/14/17 1507  WBC 7.7  --   HGB 15.4* 17.0*  HCT 49.9* 50.0*  PLT 136*  --   MCV 98.2  --   MCH 30.3  --   MCHC 30.9   --   RDW 13.5  --   LYMPHSABS 1.5  --   MONOABS 0.6  --   EOSABS 0.1  --   BASOSABS 0.0  --    ------------------------------------------------------------------------------------------------------------------  Chemistries  Recent Labs  Lab 02/14/17 1500 02/14/17 1507  NA 145 144  K 3.3* 3.2*  CL 99* 98*  CO2 22  --   GLUCOSE 92 91  BUN 22* 26*  CREATININE 1.45* 1.10*  CALCIUM 9.2  --   MG 2.0  --   AST 19  --   ALT 12*  --   ALKPHOS 129*  --   BILITOT 1.6*  --    ------------------------------------------------------------------------------------------------------------------ CrCl cannot be calculated (Unknown ideal weight.). ------------------------------------------------------------------------------------------------------------------ No results for input(s): TSH, T4TOTAL, T3FREE, THYROIDAB in the last 72 hours.  Invalid input(s): FREET3  Coagulation profile Recent Labs  Lab 02/14/17 1500  INR 1.15   ------------------------------------------------------------------------------------------------------------------- No results for input(s): DDIMER in the last 72 hours. -------------------------------------------------------------------------------------------------------------------  Cardiac Enzymes No results for input(s): CKMB, TROPONINI, MYOGLOBIN in the last 168 hours.  Invalid input(s): CK ------------------------------------------------------------------------------------------------------------------ No results found for: BNP   ---------------------------------------------------------------------------------------------------------------  Urinalysis    Component Value Date/Time   COLORURINE YELLOW 12/31/2016 [place] 12/31/2016 1215   LABSPEC 1.015 12/31/2016 1215   PHURINE 5.0 12/31/2016 1215   GLUCOSEU NEGATIVE 12/31/2016 1215   HGBUR NEGATIVE 12/31/2016 1215   BILIRUBINUR NEGATIVE 12/31/2016 1215   KETONESUR 5 (A)  12/31/2016 1215  PROTEINUR NEGATIVE 12/31/2016 1215   UROBILINOGEN 0.2 03/12/2007 1413   NITRITE NEGATIVE 12/31/2016 1215   LEUKOCYTESUR NEGATIVE 12/31/2016 1215    ----------------------------------------------------------------------------------------------------------------   Imaging Results:    Ct Angio Head W Or Wo Contrast  Result Date: 02/14/2017 CLINICAL DATA:  Aphasia. EXAM: CT ANGIOGRAPHY HEAD AND NECK CT PERFUSION BRAIN TECHNIQUE: Multidetector CT imaging of the head and neck was performed using the standard protocol during bolus administration of intravenous contrast. Multiplanar CT image reconstructions and MIPs were obtained to evaluate the vascular anatomy. Carotid stenosis measurements (when applicable) are obtained utilizing NASCET criteria, using the distal internal carotid diameter as the denominator. Multiphase CT imaging of the brain was performed following IV bolus contrast injection. Subsequent parametric perfusion maps were calculated using RAPID software. CONTRAST:  778m ISOVUE-370 IOPAMIDOL (ISOVUE-370) INJECTION 76% COMPARISON:  Head CT from earlier today. Brain MRI and MRA 04/04/2015 FINDINGS: CTA NECK FINDINGS Aortic arch: Atherosclerotic plaque. No acute finding. Three vessel branching. Right carotid system: Mild atheromatous changes at the ICA bulb. No stenosis, ulceration, or dissection. Left carotid system: Mild atheromatous changes at the common carotid bifurcation. No stenosis, ulceration, or dissection. Vertebral arteries: There is no proximal subclavian flow limiting stenosis. Mild right vertebral artery dominance. Both vertebral arteries are smooth and widely patent to the dura. Skeleton: No acute or aggressive finding. Other neck: No evidence of mass or inflammation. Upper chest: Negative Review of the MIP images confirms the above findings CTA HEAD FINDINGS Anterior circulation: Atherosclerotic calcification on the carotid siphons. No stenosis or branch  occlusion. Negative for aneurysm. Posterior circulation: Right vertebral artery dominance. The vertebrobasilar arteries are smooth and widely patent. Robust and symmetric bilateral PCA flow. Bilateral diminutive picas, especially proximally, a notable change since 2017 MRA. Venous sinuses: Patent Anatomic variants: None significant Delayed phase: Not performed in the emergent setting Review of the MIP images confirms the above findings CT Brain Perfusion Findings: CBF (<30%) Volume: 083mPerfusion (Tmax>6.0s) volume: 78m478mead T-max threshold of greater than 4 seconds there is an area of delayed flow in the lateral left frontal region. IMPRESSION: 1. No emergent large vessel occlusion.  No infarct by CT perfusion. 2. Overall mild atherosclerosis in the head neck. No correctable flow limiting stenosis. 3. Small area of delayed perfusion in the lateral left frontal lobe, possibly reflecting a small embolism not resolved by CTA. 4. Bilateral inferior cerebellar infarcts with a chronic appearance. The left-sided infarct and bilateral poor pica flow has developed since 04/04/2015. Electronically Signed   By: JonMonte FantasiaD.   On: 02/14/2017 16:00   Dg Chest 2 View  Result Date: 02/14/2017 CLINICAL DATA:  Atrial fibrillation and diabetes. Evaluate for pneumonia. Nonsmoker. EXAM: CHEST  2 VIEW COMPARISON:  01/03/2017 FINDINGS: Cardiomegaly with aortic atherosclerosis. Mild pulmonary hyperinflation. No pulmonary consolidations nor overt pulmonary edema. There is minimal atelectasis at the left lung base. Clearing of previous pleural effusions. Axillary clips are seen on the left. No acute nor suspicious osseous abnormalities. IMPRESSION: 1. The lungs are mildly hyperinflated. No alveolar consolidations. Left basilar atelectasis is noted. 2. Aortic atherosclerosis. 3. Stable cardiomegaly. Electronically Signed   By: DavAshley RoyaltyD.   On: 02/14/2017 16:44   Ct Angio Neck W Or Wo Contrast  Result Date:  02/14/2017 CLINICAL DATA:  Aphasia. EXAM: CT ANGIOGRAPHY HEAD AND NECK CT PERFUSION BRAIN TECHNIQUE: Multidetector CT imaging of the head and neck was performed using the standard protocol during bolus administration of intravenous contrast. Multiplanar CT image reconstructions and MIPs were  obtained to evaluate the vascular anatomy. Carotid stenosis measurements (when applicable) are obtained utilizing NASCET criteria, using the distal internal carotid diameter as the denominator. Multiphase CT imaging of the brain was performed following IV bolus contrast injection. Subsequent parametric perfusion maps were calculated using RAPID software. CONTRAST:  74m ISOVUE-370 IOPAMIDOL (ISOVUE-370) INJECTION 76% COMPARISON:  Head CT from earlier today. Brain MRI and MRA 04/04/2015 FINDINGS: CTA NECK FINDINGS Aortic arch: Atherosclerotic plaque. No acute finding. Three vessel branching. Right carotid system: Mild atheromatous changes at the ICA bulb. No stenosis, ulceration, or dissection. Left carotid system: Mild atheromatous changes at the common carotid bifurcation. No stenosis, ulceration, or dissection. Vertebral arteries: There is no proximal subclavian flow limiting stenosis. Mild right vertebral artery dominance. Both vertebral arteries are smooth and widely patent to the dura. Skeleton: No acute or aggressive finding. Other neck: No evidence of mass or inflammation. Upper chest: Negative Review of the MIP images confirms the above findings CTA HEAD FINDINGS Anterior circulation: Atherosclerotic calcification on the carotid siphons. No stenosis or branch occlusion. Negative for aneurysm. Posterior circulation: Right vertebral artery dominance. The vertebrobasilar arteries are smooth and widely patent. Robust and symmetric bilateral PCA flow. Bilateral diminutive picas, especially proximally, a notable change since 2017 MRA. Venous sinuses: Patent Anatomic variants: None significant Delayed phase: Not performed in  the emergent setting Review of the MIP images confirms the above findings CT Brain Perfusion Findings: CBF (<30%) Volume: 018mPerfusion (Tmax>6.0s) volume: 1m24mead T-max threshold of greater than 4 seconds there is an area of delayed flow in the lateral left frontal region. IMPRESSION: 1. No emergent large vessel occlusion.  No infarct by CT perfusion. 2. Overall mild atherosclerosis in the head neck. No correctable flow limiting stenosis. 3. Small area of delayed perfusion in the lateral left frontal lobe, possibly reflecting a small embolism not resolved by CTA. 4. Bilateral inferior cerebellar infarcts with a chronic appearance. The left-sided infarct and bilateral poor pica flow has developed since 04/04/2015. Electronically Signed   By: JonMonte FantasiaD.   On: 02/14/2017 16:00   Ct Cerebral Perfusion W Contrast  Result Date: 02/14/2017 CLINICAL DATA:  Aphasia. EXAM: CT ANGIOGRAPHY HEAD AND NECK CT PERFUSION BRAIN TECHNIQUE: Multidetector CT imaging of the head and neck was performed using the standard protocol during bolus administration of intravenous contrast. Multiplanar CT image reconstructions and MIPs were obtained to evaluate the vascular anatomy. Carotid stenosis measurements (when applicable) are obtained utilizing NASCET criteria, using the distal internal carotid diameter as the denominator. Multiphase CT imaging of the brain was performed following IV bolus contrast injection. Subsequent parametric perfusion maps were calculated using RAPID software. CONTRAST:  91m41mOVUE-370 IOPAMIDOL (ISOVUE-370) INJECTION 76% COMPARISON:  Head CT from earlier today. Brain MRI and MRA 04/04/2015 FINDINGS: CTA NECK FINDINGS Aortic arch: Atherosclerotic plaque. No acute finding. Three vessel branching. Right carotid system: Mild atheromatous changes at the ICA bulb. No stenosis, ulceration, or dissection. Left carotid system: Mild atheromatous changes at the common carotid bifurcation. No stenosis,  ulceration, or dissection. Vertebral arteries: There is no proximal subclavian flow limiting stenosis. Mild right vertebral artery dominance. Both vertebral arteries are smooth and widely patent to the dura. Skeleton: No acute or aggressive finding. Other neck: No evidence of mass or inflammation. Upper chest: Negative Review of the MIP images confirms the above findings CTA HEAD FINDINGS Anterior circulation: Atherosclerotic calcification on the carotid siphons. No stenosis or branch occlusion. Negative for aneurysm. Posterior circulation: Right vertebral artery dominance. The vertebrobasilar arteries are  smooth and widely patent. Robust and symmetric bilateral PCA flow. Bilateral diminutive picas, especially proximally, a notable change since 2017 MRA. Venous sinuses: Patent Anatomic variants: None significant Delayed phase: Not performed in the emergent setting Review of the MIP images confirms the above findings CT Brain Perfusion Findings: CBF (<30%) Volume: 59m Perfusion (Tmax>6.0s) volume: 081mHead T-max threshold of greater than 4 seconds there is an area of delayed flow in the lateral left frontal region. IMPRESSION: 1. No emergent large vessel occlusion.  No infarct by CT perfusion. 2. Overall mild atherosclerosis in the head neck. No correctable flow limiting stenosis. 3. Small area of delayed perfusion in the lateral left frontal lobe, possibly reflecting a small embolism not resolved by CTA. 4. Bilateral inferior cerebellar infarcts with a chronic appearance. The left-sided infarct and bilateral poor pica flow has developed since 04/04/2015. Electronically Signed   By: JoMonte Fantasia.D.   On: 02/14/2017 16:00   Ct Head Code Stroke Wo Contrast  Result Date: 02/14/2017 CLINICAL DATA:  Code stroke.  Right facial droop. EXAM: CT HEAD WITHOUT CONTRAST TECHNIQUE: Contiguous axial images were obtained from the base of the skull through the vertex without intravenous contrast. COMPARISON:  MRI brain  04/03/2016 FINDINGS: Brain: Extensive periventricular and subcortical white matter changes are similar to the prior exam. Posterior inferior left cerebellar hypoattenuation is new since the prior exam. No other acute cortical infarct is evident. A remote right posterior inferior cerebellar infarct is stable. The insular ribbon is intact. Basal ganglia are stable. The ventricles are proportionate to the degree of atrophy. There is no significant extra-axial fluid collection. Vascular: There is mildly increased attenuation throughout the vascular system. No asymmetric hyperdense vessel is present. Vascular calcifications are present within the cavernous internal carotid arteries bilaterally. Skull: Calvarium is intact. No focal lytic or blastic lesions are present. Sinuses/Orbits: Polyp or mucous retention cyst is noted anteriorly in the left maxillary sinus. The remaining paranasal sinuses and mastoid air cells are clear. A right lens replacement is present. Globes and orbits are otherwise within normal limits. ASPECTS (ASt.  Hospitaltroke Program Early CT Score) - Ganglionic level infarction (caudate, lentiform nuclei, internal capsule, insula, M1-M3 cortex): 7/7 - Supraganglionic infarction (M4-M6 cortex): 3/3 Total score (0-10 with 10 being normal): 10/10 IMPRESSION: 1. Left posterior inferior cerebellar nonhemorrhagic infarct is new since the prior study and may be acute. 2. Remote right cerebellar infarct. 3. No acute supratentorial infarct visible. 4. Advanced atrophy and white matter disease is stable. 5. ASPECTS is 10/10 Electronically Signed   By: ChSan Morelle.D.   On: 02/14/2017 15:22       Assessment & Plan:    Principal Problem:   [facility]:   Atrial fibrillation with RVR (HCC)   Polycythemia   Hypokalemia    CVA  w receptive aphasia, dysarthria Tele  MRI/ MRA  Brain Carotid ultrasound Cardiac echo Check hga1c, lipid, hypercoag panel, b12, folate, esr, ana, rpr,  tsh Start Aspirin 32559mo qday Start Lipitor 20m44m qhs Speech therapy, PT/ OT consulted, appreciate input Neurology consulted, appreciate input.   Afib with RVR STOP Xarelto Aspirin cardizem Gtt Cardiology consultation  Polycythemia Repeat cbc in am  Hypokalemia Replete Check cmp in am Check magnesium in am    DVT Prophylaxis   SCDs  AM Labs Ordered, also please review Full Orders  Family Communication: Admission, patients condition and plan of care including tests being ordered have been discussed with the patient  who indicate understanding and  agree with the plan and Code Status.  Code Status FULL CODE  Likely DC to  home  Condition GUARDED    Consults called: neurology , cardiology   Admission status: inpatient  Time spent in minutes : 45  Jani Gravel M.D on 02/14/2017 at 6:20 PM  Between 7am to 7pm - Pager - [phone]  . After 7pm go to [email or web address] - password [facility]  [facility] - Office  [phone]

## 2017-02-14 NOTE — ED Triage Notes (Signed)
Pt presents from [employer] with [facility], daughter in law states patient began to have speech changes in route to her appt approx 1pm today with a slight R sided facial droop; pt has remained A+O throughout transport to hospital per EMS; family also states she has been in rehab the last 2 weeks and reportedly noncompliant with medication regimen, blood thinners included; pt noted to be in Afib on the monitor, hx of same

## 2017-02-14 NOTE — ED Provider Notes (Signed)
Emergency Department Provider Note   I have reviewed the triage vital signs and the nursing notes.   HISTORY  Chief Complaint Code Stroke; Aphasia; and Facial Droop (Right)   HPI Kendra Maldonado is a 84 y.o. female who presents emergency department as a code stroke for a aphasia.  Her daughter supplies a history of the patient is a phasic.  She states that she has had some depression recently and has been out of control.  She has not been on any of her medications.  She states that she normally is alert and speaking however today around 1:00 she noticed that she was not able to speak anymore.  She taken her primary office where EMS was called and code stroke was activated and brought here. No other associated or modifying symptoms.   LEVEL V CAVEAT SECONDARY TO APHASIA FROM STROKE  Past Medical History:  Diagnosis Date  . Back pain   . Breast cancer, left breast ([place]) 2010   lumpectomy  . Cerebrovascular disease    a. 03/2015 - high grade proximal right posterior inferior cerebellar artery stenosis seen on MRA corresponding with distribution of stroke at that time.  . Chronic diarrhea   . CKD (chronic kidney disease), stage III ([place])   . Diabetes mellitus type 2, uncontrolled ([place])   . Essential hypertension   . Hiatal hernia    Stenosis of the spine  . Iron deficiency anemia 01/02/2017  . Lung nodule   . MGUS (monoclonal gammopathy of unknown significance)   . Paget's disease of bone   . Persistent atrial fibrillation ([place])    a. dx 12/2016.  Marland Kitchen Spinal stenosis   . Stroke ([place]) 03/2015  . Symptomatic anemia   . Upper GI bleed 11/2013    Patient Active Problem List   Diagnosis Date Noted  . Polycythemia 02/14/2017  . Hypokalemia 02/14/2017  . Persistent atrial fibrillation ([place]) 01/27/2017  . Spinal stenosis   . Paget's disease of bone   . Lung nodule   . Essential hypertension   . Hiatal hernia   . Diabetes mellitus type 2, uncontrolled ([place])   . CKD (chronic  kidney disease), stage II   . Cerebrovascular disease   . Dysphagia 01/06/2017  . Iron deficiency anemia 01/02/2017  . Atrial fibrillation with rapid ventricular response ([place])   . Diarrhea 12/31/2016  . Rapid atrial fibrillation ([place]) 12/31/2016  . MGUS (monoclonal gammopathy of unknown significance) 05/15/2016  . HLD (hyperlipidemia) 04/05/2015  . Cerebrovascular accident (CVA) due to occlusion of right cerebellar artery ([place])   . Stroke ([place]) 04/04/2015  . Cerebellar stroke, acute ([place]) 04/04/2015  . Hypertension 04/04/2015  . Stroke (cerebrum) ([place]) 04/04/2015  . Type 2 diabetes mellitus with vascular disease ([place]) 04/04/2015  . Anemia due to GI blood loss   . Symptomatic anemia   . Acute posthemorrhagic anemia 12/09/2013  . Upper GI bleed 12/09/2013  . Back pain 12/09/2013  . Chronic diarrhea 12/09/2013  . Breast cancer, left breast ([place]) 01/22/2008    Past Surgical History:  Procedure Laterality Date  . BREAST SURGERY    . CHOLECYSTECTOMY    . ESOPHAGOGASTRODUODENOSCOPY (EGD) WITH PROPOFOL N/A 12/10/2013   Procedure: ESOPHAGOGASTRODUODENOSCOPY (EGD) WITH PROPOFOL;  Surgeon: Jeryl Columbia, MD;  Location: [facility] ENDOSCOPY;  Service: Endoscopy;  Laterality: N/A;  . HEMORRHOID SURGERY    . PARTIAL HYSTERECTOMY      Current Outpatient Rx  . Order #: [id number] Class: Historical Med  . Order #: [id number] Class: Historical Med  .  Order #: [id number] Class: Historical Med  . Order #: [id number] Class: Print  . Order #: [id number] Class: Historical Med  . Order #: [id number] Class: Print  . Order #: [id number] Class: No Print  . Order #: [id number] Class: Print  . Order #: [id number] Class: Print  . Order #: [id number] Class: Print  . Order #: [id number] Class: Print  . Order #: [id number] Class: Print  . Order #: [id number] Class: Print    Allergies Tramadol; Codeine; and Demerol [meperidine]  Family History  Problem Relation Age of Onset  . Hypertension Mother   . Dementia Father   . Cancer  Brother        pancreas    Social History Social History   Tobacco Use  . Smoking status: Never Smoker  . Smokeless tobacco: Never Used  Substance Use Topics  . Alcohol use: No  . Drug use: No    Review of Systems  LEVEL V CAVEAT SECONDARY TO APHASIA FROM STROKE ____________________________________________   PHYSICAL EXAM:  VITAL SIGNS: Blood pressure (!) 162/117, pulse (!) 142, temperature (!) 97.3 F (36.3 C), resp. rate (!) 24, SpO2 95 %.    Constitutional: Alert. Well appearing and in no acute distress. Eyes: Conjunctivae are normal. PERRL. EOMI. Head: Atraumatic. Nose: No congestion/rhinnorhea. Mouth/Throat: Mucous membranes are moist.  Oropharynx non-erythematous. Neck: No stridor.  No meningeal signs.   Cardiovascular: TACHYCARDIC AND IRREGULAR rate, regular rhythm. Good peripheral circulation. Grossly normal heart sounds.   Respiratory: TACHYPNEIC respiratory effort.  No retractions. Lungs CTAB. Gastrointestinal: Soft and nontender. No distention.  Musculoskeletal: No lower extremity tenderness nor edema. No gross deformities of extremities. Neurologic:  Normal speech and language. No gross focal neurologic deficits are appreciated. APHASIC AND RIGHT FACIAL DROOP Skin:  Skin is warm, dry and intact. No rash noted.   ____________________________________________   LABS (all labs ordered are listed, but only abnormal results are displayed)  Labs Reviewed  CBC - Abnormal; Notable for the following components:      Result Value   Hemoglobin 15.4 (*)    HCT 49.9 (*)    Platelets 136 (*)    All other components within normal limits  COMPREHENSIVE METABOLIC PANEL - Abnormal; Notable for the following components:   Potassium 3.3 (*)    Chloride 99 (*)    BUN 22 (*)    Creatinine, Ser 1.45 (*)    ALT 12 (*)    Alkaline Phosphatase 129 (*)    Total Bilirubin 1.6 (*)    GFR calc non Af Amer 33 (*)    GFR calc Af Amer 38 (*)    Anion gap 24 (*)    All  other components within normal limits  I-STAT CHEM 8, ED - Abnormal; Notable for the following components:   Potassium 3.2 (*)    Chloride 98 (*)    BUN 26 (*)    Creatinine, Ser 1.10 (*)    Calcium, Ion 1.03 (*)    Hemoglobin 17.0 (*)    HCT 50.0 (*)    All other components within normal limits  PROTIME-INR  APTT  DIFFERENTIAL  MAGNESIUM  TROPONIN I  ANTITHROMBIN III  URINALYSIS, ROUTINE W REFLEX MICROSCOPIC  TROPONIN I  TROPONIN I  PROTEIN C ACTIVITY  PROTEIN C, TOTAL  PROTEIN S ACTIVITY  PROTEIN S, TOTAL  LUPUS ANTICOAGULANT PANEL  BETA-2-GLYCOPROTEIN I ABS, IGG/M/A  HOMOCYSTEINE  FACTOR 5 LEIDEN  PROTHROMBIN GENE MUTATION  CARDIOLIPIN ANTIBODIES, IGG, IGM, IGA  VITAMIN B12  FOLATE RBC  TSH  RPR  MAGNESIUM  I-STAT TROPONIN,  ED  CBG MONITORING, ED   ____________________________________________  EKG   EKG Interpretation  Date/Time:  Friday February 14 2017 15:45:42 EST Ventricular Rate:  155 PR Interval:    QRS Duration: 97 QT Interval:  288 QTC Calculation: 463 R Axis:   120 Text Interpretation:  Atrial fibrillation with rapid V-rate Probable lateral infarct, age indeterminate No significant change since last tracing in December 2018 Confirmed by Merrily Pew [id number]) on 02/14/2017 11:29:47 PM       ____________________________________________  RADIOLOGY  Ct Angio Head W Or Wo Contrast  Result Date: 02/14/2017 CLINICAL DATA:  Aphasia. EXAM: CT ANGIOGRAPHY HEAD AND NECK CT PERFUSION BRAIN TECHNIQUE: Multidetector CT imaging of the head and neck was performed using the standard protocol during bolus administration of intravenous contrast. Multiplanar CT image reconstructions and MIPs were obtained to evaluate the vascular anatomy. Carotid stenosis measurements (when applicable) are obtained utilizing NASCET criteria, using the distal internal carotid diameter as the denominator. Multiphase CT imaging of the brain was performed following IV bolus contrast  injection. Subsequent parametric perfusion maps were calculated using RAPID software. CONTRAST:  31mL ISOVUE-370 IOPAMIDOL (ISOVUE-370) INJECTION 76% COMPARISON:  Head CT from earlier today. Brain MRI and MRA 04/04/2015 FINDINGS: CTA NECK FINDINGS Aortic arch: Atherosclerotic plaque. No acute finding. Three vessel branching. Right carotid system: Mild atheromatous changes at the ICA bulb. No stenosis, ulceration, or dissection. Left carotid system: Mild atheromatous changes at the common carotid bifurcation. No stenosis, ulceration, or dissection. Vertebral arteries: There is no proximal subclavian flow limiting stenosis. Mild right vertebral artery dominance. Both vertebral arteries are smooth and widely patent to the dura. Skeleton: No acute or aggressive finding. Other neck: No evidence of mass or inflammation. Upper chest: Negative Review of the MIP images confirms the above findings CTA HEAD FINDINGS Anterior circulation: Atherosclerotic calcification on the carotid siphons. No stenosis or branch occlusion. Negative for aneurysm. Posterior circulation: Right vertebral artery dominance. The vertebrobasilar arteries are smooth and widely patent. Robust and symmetric bilateral PCA flow. Bilateral diminutive picas, especially proximally, a notable change since 2017 MRA. Venous sinuses: Patent Anatomic variants: None significant Delayed phase: Not performed in the emergent setting Review of the MIP images confirms the above findings CT Brain Perfusion Findings: CBF (<30%) Volume: 26mL Perfusion (Tmax>6.0s) volume: 1mL Head T-max threshold of greater than 4 seconds there is an area of delayed flow in the lateral left frontal region. IMPRESSION: 1. No emergent large vessel occlusion.  No infarct by CT perfusion. 2. Overall mild atherosclerosis in the head neck. No correctable flow limiting stenosis. 3. Small area of delayed perfusion in the lateral left frontal lobe, possibly reflecting a small embolism not resolved by  CTA. 4. Bilateral inferior cerebellar infarcts with a chronic appearance. The left-sided infarct and bilateral poor pica flow has developed since 04/04/2015. Electronically Signed   By: Monte Fantasia M.D.   On: 02/14/2017 16:00   Dg Chest 2 View  Result Date: 02/14/2017 CLINICAL DATA:  Atrial fibrillation and diabetes. Evaluate for pneumonia. Nonsmoker. EXAM: CHEST  2 VIEW COMPARISON:  01/03/2017 FINDINGS: Cardiomegaly with aortic atherosclerosis. Mild pulmonary hyperinflation. No pulmonary consolidations nor overt pulmonary edema. There is minimal atelectasis at the left lung base. Clearing of previous pleural effusions. Axillary clips are seen on the left. No acute nor suspicious osseous abnormalities. IMPRESSION: 1. The lungs are mildly hyperinflated. No alveolar consolidations. Left basilar atelectasis is noted. 2. Aortic atherosclerosis. 3. Stable cardiomegaly. Electronically Signed   By: Ashley Royalty M.D.   On: 02/14/2017  16:44   Ct Angio Neck W Or Wo Contrast  Result Date: 02/14/2017 CLINICAL DATA:  Aphasia. EXAM: CT ANGIOGRAPHY HEAD AND NECK CT PERFUSION BRAIN TECHNIQUE: Multidetector CT imaging of the head and neck was performed using the standard protocol during bolus administration of intravenous contrast. Multiplanar CT image reconstructions and MIPs were obtained to evaluate the vascular anatomy. Carotid stenosis measurements (when applicable) are obtained utilizing NASCET criteria, using the distal internal carotid diameter as the denominator. Multiphase CT imaging of the brain was performed following IV bolus contrast injection. Subsequent parametric perfusion maps were calculated using RAPID software. CONTRAST:  36mL ISOVUE-370 IOPAMIDOL (ISOVUE-370) INJECTION 76% COMPARISON:  Head CT from earlier today. Brain MRI and MRA 04/04/2015 FINDINGS: CTA NECK FINDINGS Aortic arch: Atherosclerotic plaque. No acute finding. Three vessel branching. Right carotid system: Mild atheromatous changes at the  ICA bulb. No stenosis, ulceration, or dissection. Left carotid system: Mild atheromatous changes at the common carotid bifurcation. No stenosis, ulceration, or dissection. Vertebral arteries: There is no proximal subclavian flow limiting stenosis. Mild right vertebral artery dominance. Both vertebral arteries are smooth and widely patent to the dura. Skeleton: No acute or aggressive finding. Other neck: No evidence of mass or inflammation. Upper chest: Negative Review of the MIP images confirms the above findings CTA HEAD FINDINGS Anterior circulation: Atherosclerotic calcification on the carotid siphons. No stenosis or branch occlusion. Negative for aneurysm. Posterior circulation: Right vertebral artery dominance. The vertebrobasilar arteries are smooth and widely patent. Robust and symmetric bilateral PCA flow. Bilateral diminutive picas, especially proximally, a notable change since 2017 MRA. Venous sinuses: Patent Anatomic variants: None significant Delayed phase: Not performed in the emergent setting Review of the MIP images confirms the above findings CT Brain Perfusion Findings: CBF (<30%) Volume: 63mL Perfusion (Tmax>6.0s) volume: 31mL Head T-max threshold of greater than 4 seconds there is an area of delayed flow in the lateral left frontal region. IMPRESSION: 1. No emergent large vessel occlusion.  No infarct by CT perfusion. 2. Overall mild atherosclerosis in the head neck. No correctable flow limiting stenosis. 3. Small area of delayed perfusion in the lateral left frontal lobe, possibly reflecting a small embolism not resolved by CTA. 4. Bilateral inferior cerebellar infarcts with a chronic appearance. The left-sided infarct and bilateral poor pica flow has developed since 04/04/2015. Electronically Signed   By: Monte Fantasia M.D.   On: 02/14/2017 16:00   Ct Cerebral Perfusion W Contrast  Result Date: 02/14/2017 CLINICAL DATA:  Aphasia. EXAM: CT ANGIOGRAPHY HEAD AND NECK CT PERFUSION BRAIN  TECHNIQUE: Multidetector CT imaging of the head and neck was performed using the standard protocol during bolus administration of intravenous contrast. Multiplanar CT image reconstructions and MIPs were obtained to evaluate the vascular anatomy. Carotid stenosis measurements (when applicable) are obtained utilizing NASCET criteria, using the distal internal carotid diameter as the denominator. Multiphase CT imaging of the brain was performed following IV bolus contrast injection. Subsequent parametric perfusion maps were calculated using RAPID software. CONTRAST:  18mL ISOVUE-370 IOPAMIDOL (ISOVUE-370) INJECTION 76% COMPARISON:  Head CT from earlier today. Brain MRI and MRA 04/04/2015 FINDINGS: CTA NECK FINDINGS Aortic arch: Atherosclerotic plaque. No acute finding. Three vessel branching. Right carotid system: Mild atheromatous changes at the ICA bulb. No stenosis, ulceration, or dissection. Left carotid system: Mild atheromatous changes at the common carotid bifurcation. No stenosis, ulceration, or dissection. Vertebral arteries: There is no proximal subclavian flow limiting stenosis. Mild right vertebral artery dominance. Both vertebral arteries are smooth and widely patent to the dura.  Skeleton: No acute or aggressive finding. Other neck: No evidence of mass or inflammation. Upper chest: Negative Review of the MIP images confirms the above findings CTA HEAD FINDINGS Anterior circulation: Atherosclerotic calcification on the carotid siphons. No stenosis or branch occlusion. Negative for aneurysm. Posterior circulation: Right vertebral artery dominance. The vertebrobasilar arteries are smooth and widely patent. Robust and symmetric bilateral PCA flow. Bilateral diminutive picas, especially proximally, a notable change since 2017 MRA. Venous sinuses: Patent Anatomic variants: None significant Delayed phase: Not performed in the emergent setting Review of the MIP images confirms the above findings CT Brain Perfusion  Findings: CBF (<30%) Volume: 48mL Perfusion (Tmax>6.0s) volume: 57mL Head T-max threshold of greater than 4 seconds there is an area of delayed flow in the lateral left frontal region. IMPRESSION: 1. No emergent large vessel occlusion.  No infarct by CT perfusion. 2. Overall mild atherosclerosis in the head neck. No correctable flow limiting stenosis. 3. Small area of delayed perfusion in the lateral left frontal lobe, possibly reflecting a small embolism not resolved by CTA. 4. Bilateral inferior cerebellar infarcts with a chronic appearance. The left-sided infarct and bilateral poor pica flow has developed since 04/04/2015. Electronically Signed   By: Monte Fantasia M.D.   On: 02/14/2017 16:00   Ct Head Code Stroke Wo Contrast  Result Date: 02/14/2017 CLINICAL DATA:  Code stroke.  Right facial droop. EXAM: CT HEAD WITHOUT CONTRAST TECHNIQUE: Contiguous axial images were obtained from the base of the skull through the vertex without intravenous contrast. COMPARISON:  MRI brain 04/03/2016 FINDINGS: Brain: Extensive periventricular and subcortical white matter changes are similar to the prior exam. Posterior inferior left cerebellar hypoattenuation is new since the prior exam. No other acute cortical infarct is evident. A remote right posterior inferior cerebellar infarct is stable. The insular ribbon is intact. Basal ganglia are stable. The ventricles are proportionate to the degree of atrophy. There is no significant extra-axial fluid collection. Vascular: There is mildly increased attenuation throughout the vascular system. No asymmetric hyperdense vessel is present. Vascular calcifications are present within the cavernous internal carotid arteries bilaterally. Skull: Calvarium is intact. No focal lytic or blastic lesions are present. Sinuses/Orbits: Polyp or mucous retention cyst is noted anteriorly in the left maxillary sinus. The remaining paranasal sinuses and mastoid air cells are clear. A right lens  replacement is present. Globes and orbits are otherwise within normal limits. ASPECTS [facility] Stroke Program Early CT Score) - Ganglionic level infarction (caudate, lentiform nuclei, internal capsule, insula, M1-M3 cortex): 7/7 - Supraganglionic infarction (M4-M6 cortex): 3/3 Total score (0-10 with 10 being normal): 10/10 IMPRESSION: 1. Left posterior inferior cerebellar nonhemorrhagic infarct is new since the prior study and may be acute. 2. Remote right cerebellar infarct. 3. No acute supratentorial infarct visible. 4. Advanced atrophy and white matter disease is stable. 5. ASPECTS is 10/10 Electronically Signed   By: San Morelle M.D.   On: 02/14/2017 15:22    ____________________________________________   PROCEDURES  Procedure(s) performed:   Procedures  CRITICAL CARE Performed by: Merrily Pew Total critical care time: 35 minutes Critical care time was exclusive of separately billable procedures and treating other patients. Critical care was necessary to treat or prevent imminent or life-threatening deterioration. Critical care was time spent personally by me on the following activities: development of treatment plan with patient and/or surrogate as well as nursing, discussions with consultants, evaluation of patient's response to treatment, examination of patient, obtaining history from patient or surrogate, ordering and performing treatments and interventions, ordering  and review of laboratory studies, ordering and review of radiographic studies, pulse oximetry and re-evaluation of patient's condition.  ____________________________________________   INITIAL IMPRESSION / ASSESSMENT AND PLAN / ED COURSE  CT with evidence of a acute ischemic stroke however not TPA candidate per neurology.  Will admit to medicine for stroke workup otherwise. Was also found to be in Afib RVR so started on diltiazem.  Pertinent labs & imaging results that were available during my care of the  patient were reviewed by me and considered in my medical decision making (see chart for details).  ____________________________________________  FINAL CLINICAL IMPRESSION(S) / ED DIAGNOSES  Final diagnoses:  Atrial fibrillation with rapid ventricular response ([place])  Cerebrovascular accident (CVA), unspecified mechanism ([place])  Secondary hypertension    MEDICATIONS GIVEN DURING THIS VISIT:  Medications  diltiazem (CARDIZEM) 1 mg/mL load via infusion 15 mg (15 mg Intravenous Bolus from Bag 02/14/17 1716)    And  diltiazem (CARDIZEM) 100 mg in dextrose 5% 128mL (1 mg/mL) infusion (15 mg/hr Intravenous New Bag/Given 02/14/17 2257)  iopamidol (ISOVUE-370) 76 % injection (90 mLs  Contrast Given 02/14/17 1515)  aspirin chewable tablet 324 mg (324 mg Oral Given 02/14/17 1703)  sodium chloride 0.9 % bolus 1,000 mL (0 mLs Intravenous Stopped 02/14/17 1832)  potassium chloride 10 mEq in 100 mL IVPB (0 mEq Intravenous Stopped 02/14/17 2251)  magnesium sulfate IVPB 2 g 50 mL (0 g Intravenous Stopped 02/14/17 1831)    NEW OUTPATIENT MEDICATIONS STARTED DURING THIS VISIT:  New Prescriptions   No medications on file    Note:  This note was prepared with assistance of Dragon voice recognition software. Occasional wrong-word or sound-a-like substitutions may have occurred due to the inherent limitations of voice recognition software.   Keiona Jenison, Corene Cornea, MD 02/15/17 [phone]

## 2017-02-14 NOTE — Code Documentation (Signed)
84yo female arriving to [facility] via [place] at 81.  Patient was riding in the car with her family member when she developed sudden difficulty speaking. She proceeded to her doctor's appointment where EMS was called.  Code stroke activated for aphasia and right facial droop. Stroke team at the bedside on patient arrival.  Labs drawn and patient cleared for CT by Dr. Dayna Barker. Patient to CT with team.  CT completed.  NIHSS 14, see documentation for details and code stroke times.  Patient with global aphasia and right facial droop on exam.  CTA and CTP completed with delay d/t difficulty placing appropriate PIV requiring ultrasound.  Of note, patient with diagnosis of new onset atrial fibrillation in December on Xarelto. Patient is noncompliant with medication per family, however, patient is not a tPA candidate d/t inability to determine last dose of Xarelto. Patient is not an endovascular intervention candidate.  Patient to be admitted for workup.  Bedside handoff with ED RN Clarise Cruz.

## 2017-02-14 NOTE — ED Notes (Signed)
Stroke Swallow Screen done in ED prior to transfer to floor

## 2017-02-14 NOTE — Consult Note (Addendum)
Requesting Physician: Dr. Dayna Barker    Chief Complaint: aphasia  History obtained from: EMS  HPI:                                                                                                                                         Kendra Maldonado is an 84 y.o. female who was on her way to the PCP when daughter in law noted she was aphasic. On arrival to the PCP office they called AMS to bring to hospital as code stroke to rule out stroke. She was not a tPA candidate due to no t knowing when she last took [place]. On Exam she has a mild right facial droop and is unable to follow commands and/or express herself. She is moving all extremities and responds to pain. CTA head and neck along with perfusion was obtained.   Date last known well: Date: 02/14/2017 Time last known well: Time: 13:00 tPA Given: No: unknown when last took Xeralto NIHSS 7 Modified Rankin: Rankin Score=1   Past Medical History:  Diagnosis Date  . Back pain   . Breast cancer, left breast ([place]) 2010   lumpectomy  . Cerebrovascular disease    a. 03/2015 - high grade proximal right posterior inferior cerebellar artery stenosis seen on MRA corresponding with distribution of stroke at that time.  . Chronic diarrhea   . CKD (chronic kidney disease), stage III ([place])   . Diabetes mellitus type 2, uncontrolled ([place])   . Essential hypertension   . Hiatal hernia    Stenosis of the spine  . Iron deficiency anemia 01/02/2017  . Lung nodule   . MGUS (monoclonal gammopathy of unknown significance)   . Paget's disease of bone   . Persistent atrial fibrillation ([place])    a. dx 12/2016.  Marland Kitchen Spinal stenosis   . Stroke ([place]) 03/2015  . Symptomatic anemia   . Upper GI bleed 11/2013    Past Surgical History:  Procedure Laterality Date  . BREAST SURGERY    . CHOLECYSTECTOMY    . ESOPHAGOGASTRODUODENOSCOPY (EGD) WITH PROPOFOL N/A 12/10/2013   Procedure: ESOPHAGOGASTRODUODENOSCOPY (EGD) WITH PROPOFOL;  Surgeon: Jeryl Columbia, MD;   Location: [facility] ENDOSCOPY;  Service: Endoscopy;  Laterality: N/A;  . HEMORRHOID SURGERY    . PARTIAL HYSTERECTOMY      Family History  Problem Relation Age of Onset  . Hypertension Mother   . Dementia Father   . Cancer Brother        pancreas   Social History:  reports that  has never smoked. she has never used smokeless tobacco. She reports that she does not drink alcohol or use drugs.  Allergies:  Allergies  Allergen Reactions  . Tramadol     DIZZY, "WENT OUT"  . Codeine Rash  . Demerol [Meperidine] Rash    Medications:  Current Facility-Administered Medications  Medication Dose Route Frequency Provider Last Rate Last Dose  . iopamidol (ISOVUE-370) 76 % injection            Current Outpatient Medications  Medication Sig Dispense Refill  . acetaminophen (TYLENOL) 500 MG tablet Take 1,000 mg by mouth every 6 (six) hours as needed for mild pain.    Marland Kitchen atorvastatin (LIPITOR) 40 MG tablet Take 1 tablet (40 mg total) by mouth daily at 6 PM. (Patient not taking: Reported on 12/31/2016) 30 tablet 1  . Cholecalciferol (D 2000) 2000 UNITS TABS Take 2,000 Units by mouth daily.    . cholestyramine (QUESTRAN) 4 g packet Take 1 packet (4 g total) by mouth 2 (two) times daily. Take other meds 1 hour before or 4-6 hours after cholestyramine 60 each 0  . diclofenac sodium (VOLTAREN) 1 % GEL Apply 1 application topically 3 (three) times daily as needed for pain.    Marland Kitchen diltiazem (CARDIZEM CD) 240 MG 24 hr capsule Take 1 capsule (240 mg total) by mouth daily. 30 capsule 0  . diphenoxylate-atropine (LOMOTIL) 2.5-0.025 MG tablet Take 2 tablets by mouth every morning. Take an additional dose as needed for chronic diarrhea, if constipated please hold dose 30 tablet 0  . LORazepam (ATIVAN) 0.5 MG tablet Take 1 tablet (0.5 mg total) by mouth 2 (two) times daily as needed for anxiety. 10  tablet 0  . metoprolol tartrate (LOPRESSOR) 100 MG tablet Take 1 tablet (100 mg total) by mouth 2 (two) times daily. 60 tablet 0  . omeprazole (PRILOSEC) 20 MG capsule Take 1 capsule (20 mg total) by mouth 2 (two) times daily before a meal.    . Rivaroxaban (XARELTO) 15 MG TABS tablet Take 1 tablet (15 mg total) by mouth daily with supper. 42 tablet 0  . traMADol (ULTRAM) 50 MG tablet Take 1 tablet (50 mg total) by mouth every 12 (twelve) hours as needed for moderate pain. 12 tablet 0    ROS:                                                                                                                                       History obtained from unobtainable from patient due to language barrier   General Examination:                                                                                                      There were no vitals taken for this visit.  HEENT-  Normocephalic, no lesions, without obvious abnormality.  Normal external eye and conjunctiva.   Cardiovascular- S1-S2 audible, pulses palpable throughout   Lungs-no rhonchi or wheezing noted, no excessive working breathing.  Saturations within normal limits Abdomen- All 4 quadrants palpated and nontender Extremities- Warm, dry and intact Musculoskeletal-no joint tenderness, deformity or swelling Skin-warm and dry, no hyperpigmentation, vitiligo, or suspicious lesions  Neurological Examination Mental Status: Aphasic and not following commands.  Cranial Nerves: II: blinks to threat bilaterally III,IV, VI: ptosis not present, extra-ocular motions intact bilaterally, pupils equal, round, reactive to light and accommodation V,VII: mild right facial droop at rest. , facial light touch sensation normal bilaterally VIII: hearing normal bilaterally  Motor: Right : Upper extremity   5/5    Left:     Upper extremity   5/5  Lower extremity   5/5     Lower extremity   5/5 Tone and bulk:normal tone throughout; no atrophy  noted Sensory: withdraws all 4 extremities to pan.  Deep Tendon Reflexes: 2+ and symmetric throughout Plantars: Right: downgoing   Left: downgoing Cerebellar: Unable toobtain Gait: not tested  1a Level of Conscious.: 0 1b LOC Questions: 2 1c LOC Commands: 2 2 Best Gaze: 0 3 Visual: 0 4 Facial Palsy: 1 5a Motor Arm - left: 0 5b Motor Arm - Right: 0 6a Motor Leg - Left: 0 6b Motor Leg - Right: 0 7 Limb Ataxia: 0 8 Sensory: 0 9 Best Language: 2 10 Dysarthria: 0 11 Extinct. and Inatten.:0  TOTAL: 7 (Initial eval NIHSS was higher - 14 due to drift in all extremities)   Lab Results: Basic Metabolic Panel: Recent Labs  Lab 02/14/17 1507  NA 144  K 3.2*  CL 98*  GLUCOSE 91  BUN 26*  CREATININE 1.10*   CBC: Recent Labs  Lab 02/14/17 1500 02/14/17 1507  WBC 7.7  --   NEUTROABS 5.5  --   HGB 15.4* 17.0*  HCT 49.9* 50.0*  MCV 98.2  --   PLT 136*  --    Imaging: IMPRESSION: 1. Left posterior inferior cerebellar nonhemorrhagic infarct is new since the prior study and may be acute. 2. Remote right cerebellar infarct. 3. No acute supratentorial infarct visible. 4. Advanced atrophy and white matter disease is stable. 5. ASPECTS is 10/10  Assessment and plan discussed with with attending physician and they are in agreement.    Etta Quill PA-C [facility] [phone]  02/14/2017, 3:21 PM   Attending Neurohospitalist Addendum Patient seen and examined with APP/Resident. Agree with the history and physical as documented above. I have reviewed the images obtained. Noncontrast CT of the head does not show any acute changes with the exception of possible left PICA stroke.  Otherwise all chronic changes.  CT angiogram of the head did not reveal any large vessel occlusion that is amenable to endovascular treatment.  Assessment: 84 y.o. female with a past medical history of atrial fibrillation, unclear when she last took her dose of Xarelto, who has been  very depressed for the past 1 month or so and has not been eating or drinking much, was being taken to her doctor's office when en route she had sudden onset of inability to speak and mild right facial droop. On my examination in the emergency room she was aphasic, with unclear time of last Xarelto dose. I did not see focal weakness. I discussed with the family in detail the risks of using IV TPA while somebody is on Xarelto and they also agreed that  we should not be pursuing IV TPA. CTA of the head and neck and CT perfusion studies were done that did not reveal any large vessel occlusion that is amenable for endovascular intervention. Currently I think she has a stroke due to distal embolization in 1 of the distal branches of the left MCA. Other differentials to consider are toxic metabolic encephalopathy. Given her risk factors, I would workup for stroke.  Impression -Possible LMCA and left PICA stroke -Toxic metabolic encephalopathy -failure to thrive  Stroke Risk Factors - diabetes mellitus and hypertension  Recommend 1. HgbA1c, fasting lipid panel 2. MRI of the brain without contrast 3. PT consult, OT consult, Speech consult 4. Echocardiogram 5. 80 mg of Atorvistatin 6. Prophylactic therapy-Antiplatelet med: ASA 325 mg daily 7. Risk factor modification 8. Telemetry monitoring 9. Frequent neuro checks 10 NPO until passes stroke swallow screen 11 permissive hypertension for 24-48 hours.  As for the management of atrial fibrillation, It is okay to use beta-blockers to obtain rate control.  Try avoiding lowering the blood pressures too much.  Ideally, I would not treat systolic blood pressures unless there are above 220. 12. Address FTT per primary team 13. Management of Afib with RVR per ER/IM   please page stroke NP  Or  PA  Or MD from 8am -4 pm  as this patient from this time will be  followed by the stroke.   You can look them up on [email or web address]  Password [email or web address]  -- Amie Portland,  MD [facility] Pager: [phone] If 7pm to 7am, please call on call as listed on AMION.   CRITICAL CARE ATTESTATION This patient is critically ill and at significant risk of neurological worsening, death and care requires constant monitoring of vital signs, hemodynamics,respiratory and cardiac monitoring. I spent 55  minutes of neurocritical care time performing neurological assessment, discussion with family, other specialists and medical decision making of high complexityin the care of  this patient.

## 2017-02-15 ENCOUNTER — Encounter (HOSPITAL_COMMUNITY): Payer: Medicare Other

## 2017-02-15 ENCOUNTER — Inpatient Hospital Stay (HOSPITAL_COMMUNITY): Payer: Medicare Other

## 2017-02-15 ENCOUNTER — Other Ambulatory Visit: Payer: Self-pay

## 2017-02-15 DIAGNOSIS — F32A Depression, unspecified: Secondary | ICD-10-CM

## 2017-02-15 DIAGNOSIS — I340 Nonrheumatic mitral (valve) insufficiency: Secondary | ICD-10-CM

## 2017-02-15 DIAGNOSIS — Z8673 Personal history of transient ischemic attack (TIA), and cerebral infarction without residual deficits: Secondary | ICD-10-CM

## 2017-02-15 DIAGNOSIS — F329 Major depressive disorder, single episode, unspecified: Secondary | ICD-10-CM

## 2017-02-15 DIAGNOSIS — I639 Cerebral infarction, unspecified: Secondary | ICD-10-CM

## 2017-02-15 LAB — LIPID PANEL
CHOLESTEROL: 157 mg/dL (ref 0–200)
HDL: 45 mg/dL (ref >40)
LDL Cholesterol: 91 mg/dL (ref 0–99)
Total CHOL/HDL Ratio: 3.5 RATIO
Triglycerides: 103 mg/dL (ref <150)
VLDL: 21 mg/dL (ref 0–40)

## 2017-02-15 LAB — COMPREHENSIVE METABOLIC PANEL
ALT: 11 U/L — ABNORMAL LOW (ref 14–54)
AST: 15 U/L (ref 15–41)
Albumin: 3.5 g/dL (ref 3.5–5.0)
Alkaline Phosphatase: 106 U/L (ref 38–126)
Anion gap: 22 — ABNORMAL HIGH (ref 5–15)
BUN: 13 mg/dL (ref 6–20)
CHLORIDE: 99 mmol/L — AB (ref 101–111)
CO2: 20 mmol/L — AB (ref 22–32)
CREATININE: 1.18 mg/dL — AB (ref 0.44–1.00)
Calcium: 8.8 mg/dL — ABNORMAL LOW (ref 8.9–10.3)
GFR, EST AFRICAN AMERICAN: 49 mL/min — AB (ref >60)
GFR, EST NON AFRICAN AMERICAN: 42 mL/min — AB (ref >60)
Glucose, Bld: 86 mg/dL (ref 65–99)
POTASSIUM: 2.9 mmol/L — AB (ref 3.5–5.1)
SODIUM: 141 mmol/L (ref 135–145)
Total Bilirubin: 1.2 mg/dL (ref 0.3–1.2)
Total Protein: 6.4 g/dL — ABNORMAL LOW (ref 6.5–8.1)

## 2017-02-15 LAB — MRSA PCR SCREENING: MRSA by PCR: NEGATIVE

## 2017-02-15 LAB — CBC
HEMATOCRIT: 43.9 % (ref 36.0–46.0)
HEMOGLOBIN: 13.5 g/dL (ref 12.0–15.0)
MCH: 29.7 pg (ref 26.0–34.0)
MCHC: 30.8 g/dL (ref 30.0–36.0)
MCV: 96.7 fL (ref 78.0–100.0)
PLATELETS: 120 10*3/uL — AB (ref 150–400)
RBC: 4.54 MIL/uL (ref 3.87–5.11)
RDW: 13.3 % (ref 11.5–15.5)
WBC: 6.7 10*3/uL (ref 4.0–10.5)

## 2017-02-15 LAB — ECHOCARDIOGRAM COMPLETE
HEIGHTINCHES: 67 in
Weight: 2402.13 oz

## 2017-02-15 LAB — TSH: TSH: 1.836 u[IU]/mL (ref 0.350–4.500)

## 2017-02-15 LAB — VITAMIN B12: Vitamin B-12: 1347 pg/mL — ABNORMAL HIGH (ref 180–914)

## 2017-02-15 LAB — TROPONIN I
Troponin I: <0.03 ng/mL (ref <0.03)
Troponin I: <0.03 ng/mL (ref <0.03)

## 2017-02-15 LAB — RPR: RPR: NONREACTIVE

## 2017-02-15 LAB — HEMOGLOBIN A1C
HEMOGLOBIN A1C: 6.3 % — AB (ref 4.8–5.6)
MEAN PLASMA GLUCOSE: 134.11 mg/dL

## 2017-02-15 LAB — MAGNESIUM: MAGNESIUM: 2 mg/dL (ref 1.7–2.4)

## 2017-02-15 MED ORDER — ASPIRIN 325 MG PO TABS
325.0000 mg | ORAL_TABLET | Freq: Every day | ORAL | Status: DC
  Administered 2017-02-15 – 2017-02-17 (×3): 325 mg via ORAL
  Filled 2017-02-15 (×3): qty 1

## 2017-02-15 MED ORDER — ATORVASTATIN CALCIUM 40 MG PO TABS
40.0000 mg | ORAL_TABLET | Freq: Every day | ORAL | Status: DC
  Administered 2017-02-15 – 2017-02-19 (×5): 40 mg via ORAL
  Filled 2017-02-15 (×5): qty 1

## 2017-02-15 MED ORDER — POTASSIUM CHLORIDE CRYS ER 20 MEQ PO TBCR
40.0000 meq | EXTENDED_RELEASE_TABLET | ORAL | Status: AC
  Administered 2017-02-15 (×2): 40 meq via ORAL
  Filled 2017-02-15 (×2): qty 2

## 2017-02-15 MED ORDER — METOPROLOL TARTRATE 12.5 MG HALF TABLET
12.5000 mg | ORAL_TABLET | Freq: Two times a day (BID) | ORAL | Status: DC
  Administered 2017-02-15 – 2017-02-16 (×3): 12.5 mg via ORAL
  Filled 2017-02-15 (×3): qty 1

## 2017-02-15 MED ORDER — ENOXAPARIN SODIUM 40 MG/0.4ML ~~LOC~~ SOLN
40.0000 mg | SUBCUTANEOUS | Status: DC
  Administered 2017-02-15 – 2017-02-16 (×2): 40 mg via SUBCUTANEOUS
  Filled 2017-02-15: qty 0.4

## 2017-02-15 MED ORDER — ACETAMINOPHEN 650 MG RE SUPP: 650.0000 mg | RECTAL | Status: DC | PRN

## 2017-02-15 MED ORDER — ATORVASTATIN CALCIUM 80 MG PO TABS: 80.0000 mg | ORAL_TABLET | Freq: Every day | ORAL | Status: DC

## 2017-02-15 MED ORDER — ACETAMINOPHEN 325 MG PO TABS
650.0000 mg | ORAL_TABLET | ORAL | Status: DC | PRN
  Administered 2017-02-15: 650 mg via ORAL
  Filled 2017-02-15: qty 2

## 2017-02-15 MED ORDER — STROKE: EARLY STAGES OF RECOVERY BOOK
Freq: Once | Status: AC
  Administered 2017-02-15: 02:00:00
  Filled 2017-02-15: qty 1

## 2017-02-15 MED ORDER — ASPIRIN 300 MG RE SUPP: 300.0000 mg | Freq: Every day | RECTAL | Status: DC

## 2017-02-15 MED ORDER — ACETAMINOPHEN 160 MG/5ML PO SOLN: 650.0000 mg | ORAL | Status: DC | PRN

## 2017-02-15 NOTE — Progress Notes (Signed)
PROGRESS NOTE    Kendra MARCEL  Maldonado:[id number] DOB: 27-Oct-1933 DOA: 02/14/2017 PCP: Lavone Orn, MD  Outpatient Specialists:   Brief Narrative:   Patient is an 83 y.o. female, w h/o Breast cancer, Pagets disease, Mgus, Dm2 with CKD stage 3, Pafib, CVA.  Patient presented with aphasia, facial asymmetry, and gait instability.  Patient was seen by neurology on presentation, but there was no need for TPA. Pt's family thinks that she has not taken her xarelto in the past 3 days.  According to the patient's son, the patient has been depressed, therefore, has not been taking her medication.  If he just seems to be improving, but the patient remains significantly dysarthric.  Facial asymmetry also persists.  Patient is back to her Eliquis.  His speech therapy is currently evaluating patient's swallowing.  The case was discussed with the neurologist.  Patient remains in atrial fibrillation. Assessment & Plan:   Principal Problem:   Stroke [facility]) Active Problems:   Atrial fibrillation with rapid ventricular response (HCC)   Polycythemia   Hypokalemia   Stroke: We will continue Eliquis for now. Appreciate neurology impaired. Followed the results of the speech evaluation. Supportive management. Needs to comply with medication was discussed with the patient extensively.  Atrial fibrillation with rapid ventricular response: Continue Cardizem. Await cardiology input.  Cardiology team has already been consulted.  Depression: This seems to be recurrent. There will be need for psychiatric input either on inpatient basis or outpatient visits.   DVT prophylaxis: Eliquis Code Status: Full Family Communication: Son and daughter in law Disposition Plan: Will depend on hospital course   Consultants:   Neurology  Cardiology  Procedures:     Antimicrobials:       Subjective: Dysarthric  Objective: Vitals:   02/15/17 0403 02/15/17 0500 02/15/17 0600 02/15/17 0700  BP: 126/70  129/87 134/71   Pulse:  60 (!) 44   Resp:  (!) 21 17   Temp: 97.9 F (36.6 C)   97.7 F (36.5 C)  TempSrc: Oral   Oral  SpO2:  96% 94%   Weight:      Height:        Intake/Output Summary (Last 24 hours) at 02/15/2017 1708 Last data filed at 02/15/2017 1658 Gross per 24 hour  Intake 4069.83 ml  Output 1 ml  Net 4068.83 ml   Filed Weights   02/15/17 0052  Weight: 68.1 kg (150 lb 2.1 oz)    Examination:  General exam: Appears calm and comfortable. Facial asymmetry.  Respiratory system: Clear to auscultation.  Cardiovascular system: S1 & S2, irregular.   Gastrointestinal system: Abdomen is nondistended, soft and nontender. No organomegaly or masses felt. Normal bowel sounds heard. Central nervous system: Alert and oriented. Facial asymmetry. Dysarthric. Moves all limbs Extremities: No edema.  Data Reviewed: I have personally reviewed following labs and imaging studies  CBC: Recent Labs  Lab 02/14/17 1500 02/14/17 1507 02/15/17 0716  WBC 7.7  --  6.7  NEUTROABS 5.5  --   --   HGB 15.4* 17.0* 13.5  HCT 49.9* 50.0* 43.9  MCV 98.2  --  96.7  PLT 136*  --  751*   Basic Metabolic Panel: Recent Labs  Lab 02/14/17 1500 02/14/17 1507 02/15/17 0716  NA 145 144 141  K 3.3* 3.2* 2.9*  CL 99* 98* 99*  CO2 22  --  20*  GLUCOSE 92 91 86  BUN 22* 26* 13  CREATININE 1.45* 1.10* 1.18*  CALCIUM 9.2  --  8.8*  MG 2.0  --  2.0   GFR: Estimated Creatinine Clearance: 36.4 mL/min (A) (by C-G formula based on SCr of 1.18 mg/dL (H)). Liver Function Tests: Recent Labs  Lab 02/14/17 1500 02/15/17 0716  AST 19 15  ALT 12* 11*  ALKPHOS 129* 106  BILITOT 1.6* 1.2  PROT 7.2 6.4*  ALBUMIN 4.0 3.5   No results for input(s): LIPASE, AMYLASE in the last 168 hours. No results for input(s): AMMONIA in the last 168 hours. Coagulation Profile: Recent Labs  Lab 02/14/17 1500  INR 1.15   Cardiac Enzymes: Recent Labs  Lab 02/14/17 2108 02/15/17 0231 02/15/17 0716  TROPONINI  <0.03 <0.03 <0.03   BNP (last 3 results) No results for input(s): PROBNP in the last 8760 hours. HbA1C: Recent Labs    02/15/17 0231  HGBA1C 6.3*   CBG: No results for input(s): GLUCAP in the last 168 hours. Lipid Profile: Recent Labs    02/15/17 0231  CHOL 157  HDL 45  LDLCALC 91  TRIG 103  CHOLHDL 3.5   Thyroid Function Tests: Recent Labs    02/15/17 0231  TSH 1.836   Anemia Panel: Recent Labs    02/15/17 0716  VITAMINB12 1,347*   Urine analysis:    Component Value Date/Time   COLORURINE YELLOW 12/31/2016 1215   APPEARANCEUR CLEAR 12/31/2016 1215   LABSPEC 1.015 12/31/2016 1215   PHURINE 5.0 12/31/2016 1215   GLUCOSEU NEGATIVE 12/31/2016 1215   HGBUR NEGATIVE 12/31/2016 1215   BILIRUBINUR NEGATIVE 12/31/2016 1215   KETONESUR 5 (A) 12/31/2016 1215   PROTEINUR NEGATIVE 12/31/2016 1215   UROBILINOGEN 0.2 03/12/2007 1413   NITRITE NEGATIVE 12/31/2016 1215   LEUKOCYTESUR NEGATIVE 12/31/2016 1215   Sepsis Labs: @LABRCNTIP (procalcitonin:4,lacticidven:4)  ) Recent Results (from the past 240 hour(s))  MRSA PCR Screening     Status: None   Collection Time: 02/15/17  2:14 AM  Result Value Ref Range Status   MRSA by PCR NEGATIVE NEGATIVE Final    Comment:        The GeneXpert MRSA Assay (FDA approved for NASAL specimens only), is one component of a comprehensive MRSA colonization surveillance program. It is not intended to diagnose MRSA infection nor to guide or monitor treatment for MRSA infections.          Radiology Studies: Ct Angio Head W Or Wo Contrast  Result Date: 02/14/2017 CLINICAL DATA:  Aphasia. EXAM: CT ANGIOGRAPHY HEAD AND NECK CT PERFUSION BRAIN TECHNIQUE: Multidetector CT imaging of the head and neck was performed using the standard protocol during bolus administration of intravenous contrast. Multiplanar CT image reconstructions and MIPs were obtained to evaluate the vascular anatomy. Carotid stenosis measurements (when applicable)  are obtained utilizing NASCET criteria, using the distal internal carotid diameter as the denominator. Multiphase CT imaging of the brain was performed following IV bolus contrast injection. Subsequent parametric perfusion maps were calculated using RAPID software. CONTRAST:  55mL ISOVUE-370 IOPAMIDOL (ISOVUE-370) INJECTION 76% COMPARISON:  Head CT from earlier today. Brain MRI and MRA 04/04/2015 FINDINGS: CTA NECK FINDINGS Aortic arch: Atherosclerotic plaque. No acute finding. Three vessel branching. Right carotid system: Mild atheromatous changes at the ICA bulb. No stenosis, ulceration, or dissection. Left carotid system: Mild atheromatous changes at the common carotid bifurcation. No stenosis, ulceration, or dissection. Vertebral arteries: There is no proximal subclavian flow limiting stenosis. Mild right vertebral artery dominance. Both vertebral arteries are smooth and widely patent to the dura. Skeleton: No acute or aggressive finding. Other neck: No evidence of mass  or inflammation. Upper chest: Negative Review of the MIP images confirms the above findings CTA HEAD FINDINGS Anterior circulation: Atherosclerotic calcification on the carotid siphons. No stenosis or branch occlusion. Negative for aneurysm. Posterior circulation: Right vertebral artery dominance. The vertebrobasilar arteries are smooth and widely patent. Robust and symmetric bilateral PCA flow. Bilateral diminutive picas, especially proximally, a notable change since 2017 MRA. Venous sinuses: Patent Anatomic variants: None significant Delayed phase: Not performed in the emergent setting Review of the MIP images confirms the above findings CT Brain Perfusion Findings: CBF (<30%) Volume: 61mL Perfusion (Tmax>6.0s) volume: 80mL Head T-max threshold of greater than 4 seconds there is an area of delayed flow in the lateral left frontal region. IMPRESSION: 1. No emergent large vessel occlusion.  No infarct by CT perfusion. 2. Overall mild  atherosclerosis in the head neck. No correctable flow limiting stenosis. 3. Small area of delayed perfusion in the lateral left frontal lobe, possibly reflecting a small embolism not resolved by CTA. 4. Bilateral inferior cerebellar infarcts with a chronic appearance. The left-sided infarct and bilateral poor pica flow has developed since 04/04/2015. Electronically Signed   By: Monte Fantasia M.D.   On: 02/14/2017 16:00   Dg Chest 2 View  Result Date: 02/14/2017 CLINICAL DATA:  Atrial fibrillation and diabetes. Evaluate for pneumonia. Nonsmoker. EXAM: CHEST  2 VIEW COMPARISON:  01/03/2017 FINDINGS: Cardiomegaly with aortic atherosclerosis. Mild pulmonary hyperinflation. No pulmonary consolidations nor overt pulmonary edema. There is minimal atelectasis at the left lung base. Clearing of previous pleural effusions. Axillary clips are seen on the left. No acute nor suspicious osseous abnormalities. IMPRESSION: 1. The lungs are mildly hyperinflated. No alveolar consolidations. Left basilar atelectasis is noted. 2. Aortic atherosclerosis. 3. Stable cardiomegaly. Electronically Signed   By: Ashley Royalty M.D.   On: 02/14/2017 16:44   Ct Angio Neck W Or Wo Contrast  Result Date: 02/14/2017 CLINICAL DATA:  Aphasia. EXAM: CT ANGIOGRAPHY HEAD AND NECK CT PERFUSION BRAIN TECHNIQUE: Multidetector CT imaging of the head and neck was performed using the standard protocol during bolus administration of intravenous contrast. Multiplanar CT image reconstructions and MIPs were obtained to evaluate the vascular anatomy. Carotid stenosis measurements (when applicable) are obtained utilizing NASCET criteria, using the distal internal carotid diameter as the denominator. Multiphase CT imaging of the brain was performed following IV bolus contrast injection. Subsequent parametric perfusion maps were calculated using RAPID software. CONTRAST:  41mL ISOVUE-370 IOPAMIDOL (ISOVUE-370) INJECTION 76% COMPARISON:  Head CT from earlier  today. Brain MRI and MRA 04/04/2015 FINDINGS: CTA NECK FINDINGS Aortic arch: Atherosclerotic plaque. No acute finding. Three vessel branching. Right carotid system: Mild atheromatous changes at the ICA bulb. No stenosis, ulceration, or dissection. Left carotid system: Mild atheromatous changes at the common carotid bifurcation. No stenosis, ulceration, or dissection. Vertebral arteries: There is no proximal subclavian flow limiting stenosis. Mild right vertebral artery dominance. Both vertebral arteries are smooth and widely patent to the dura. Skeleton: No acute or aggressive finding. Other neck: No evidence of mass or inflammation. Upper chest: Negative Review of the MIP images confirms the above findings CTA HEAD FINDINGS Anterior circulation: Atherosclerotic calcification on the carotid siphons. No stenosis or branch occlusion. Negative for aneurysm. Posterior circulation: Right vertebral artery dominance. The vertebrobasilar arteries are smooth and widely patent. Robust and symmetric bilateral PCA flow. Bilateral diminutive picas, especially proximally, a notable change since 2017 MRA. Venous sinuses: Patent Anatomic variants: None significant Delayed phase: Not performed in the emergent setting Review of the MIP images confirms  the above findings CT Brain Perfusion Findings: CBF (<30%) Volume: 26mL Perfusion (Tmax>6.0s) volume: 39mL Head T-max threshold of greater than 4 seconds there is an area of delayed flow in the lateral left frontal region. IMPRESSION: 1. No emergent large vessel occlusion.  No infarct by CT perfusion. 2. Overall mild atherosclerosis in the head neck. No correctable flow limiting stenosis. 3. Small area of delayed perfusion in the lateral left frontal lobe, possibly reflecting a small embolism not resolved by CTA. 4. Bilateral inferior cerebellar infarcts with a chronic appearance. The left-sided infarct and bilateral poor pica flow has developed since 04/04/2015. Electronically Signed    By: Monte Fantasia M.D.   On: 02/14/2017 16:00   Mr Brain Wo Contrast  Result Date: 02/15/2017 CLINICAL DATA:  Altered level of consciousness.  Receptive aphasia. EXAM: MRI HEAD WITHOUT CONTRAST TECHNIQUE: Multiplanar, multiecho pulse sequences of the brain and surrounding structures were obtained without intravenous contrast. COMPARISON:  CTA and cerebral perfusion from yesterday FINDINGS: Brain: In the area of previously noted delayed perfusion is a lateral left frontal infarct measuring up to 2.6 cm, cortical and white matter. No acute infarct in alternate distribution. No hemorrhagic conversion. Extensive bilateral inferior cerebellar infarction that has progressed on the left when compared to 04/04/2015 brain MRI. There is T1 hyperintensity within the left cerebellar infarct distribution consistent with cortical laminar necrosis or nonacute blood products. Moderate chronic small vessel ischemic type change in the cerebral white matter. No hydrocephalus or masslike finding. Vascular: Major flow voids are preserved. Skull and upper cervical spine: Negative for marrow lesion Sinuses/Orbits: Right cataract resection. Small left maxillary mucous retention cysts. Mild bilateral mastoid opacification. IMPRESSION: 1. 2.6 cm acute infarct in the lateral left frontal lobe. This correlates with the area of delayed perfusion on CTP yesterday. 2. Large nonacute infarcts of the bilateral inferior cerebellum, progressed on the left since 2017 comparison. Electronically Signed   By: Monte Fantasia M.D.   On: 02/15/2017 13:05   Ct Cerebral Perfusion W Contrast  Result Date: 02/14/2017 CLINICAL DATA:  Aphasia. EXAM: CT ANGIOGRAPHY HEAD AND NECK CT PERFUSION BRAIN TECHNIQUE: Multidetector CT imaging of the head and neck was performed using the standard protocol during bolus administration of intravenous contrast. Multiplanar CT image reconstructions and MIPs were obtained to evaluate the vascular anatomy. Carotid  stenosis measurements (when applicable) are obtained utilizing NASCET criteria, using the distal internal carotid diameter as the denominator. Multiphase CT imaging of the brain was performed following IV bolus contrast injection. Subsequent parametric perfusion maps were calculated using RAPID software. CONTRAST:  40mL ISOVUE-370 IOPAMIDOL (ISOVUE-370) INJECTION 76% COMPARISON:  Head CT from earlier today. Brain MRI and MRA 04/04/2015 FINDINGS: CTA NECK FINDINGS Aortic arch: Atherosclerotic plaque. No acute finding. Three vessel branching. Right carotid system: Mild atheromatous changes at the ICA bulb. No stenosis, ulceration, or dissection. Left carotid system: Mild atheromatous changes at the common carotid bifurcation. No stenosis, ulceration, or dissection. Vertebral arteries: There is no proximal subclavian flow limiting stenosis. Mild right vertebral artery dominance. Both vertebral arteries are smooth and widely patent to the dura. Skeleton: No acute or aggressive finding. Other neck: No evidence of mass or inflammation. Upper chest: Negative Review of the MIP images confirms the above findings CTA HEAD FINDINGS Anterior circulation: Atherosclerotic calcification on the carotid siphons. No stenosis or branch occlusion. Negative for aneurysm. Posterior circulation: Right vertebral artery dominance. The vertebrobasilar arteries are smooth and widely patent. Robust and symmetric bilateral PCA flow. Bilateral diminutive picas, especially proximally, a notable  change since 2017 MRA. Venous sinuses: Patent Anatomic variants: None significant Delayed phase: Not performed in the emergent setting Review of the MIP images confirms the above findings CT Brain Perfusion Findings: CBF (<30%) Volume: 41mL Perfusion (Tmax>6.0s) volume: 52mL Head T-max threshold of greater than 4 seconds there is an area of delayed flow in the lateral left frontal region. IMPRESSION: 1. No emergent large vessel occlusion.  No infarct by CT  perfusion. 2. Overall mild atherosclerosis in the head neck. No correctable flow limiting stenosis. 3. Small area of delayed perfusion in the lateral left frontal lobe, possibly reflecting a small embolism not resolved by CTA. 4. Bilateral inferior cerebellar infarcts with a chronic appearance. The left-sided infarct and bilateral poor pica flow has developed since 04/04/2015. Electronically Signed   By: Monte Fantasia M.D.   On: 02/14/2017 16:00   Ct Head Code Stroke Wo Contrast  Result Date: 02/14/2017 CLINICAL DATA:  Code stroke.  Right facial droop. EXAM: CT HEAD WITHOUT CONTRAST TECHNIQUE: Contiguous axial images were obtained from the base of the skull through the vertex without intravenous contrast. COMPARISON:  MRI brain 04/03/2016 FINDINGS: Brain: Extensive periventricular and subcortical white matter changes are similar to the prior exam. Posterior inferior left cerebellar hypoattenuation is new since the prior exam. No other acute cortical infarct is evident. A remote right posterior inferior cerebellar infarct is stable. The insular ribbon is intact. Basal ganglia are stable. The ventricles are proportionate to the degree of atrophy. There is no significant extra-axial fluid collection. Vascular: There is mildly increased attenuation throughout the vascular system. No asymmetric hyperdense vessel is present. Vascular calcifications are present within the cavernous internal carotid arteries bilaterally. Skull: Calvarium is intact. No focal lytic or blastic lesions are present. Sinuses/Orbits: Polyp or mucous retention cyst is noted anteriorly in the left maxillary sinus. The remaining paranasal sinuses and mastoid air cells are clear. A right lens replacement is present. Globes and orbits are otherwise within normal limits. ASPECTS [facility] Stroke Program Early CT Score) - Ganglionic level infarction (caudate, lentiform nuclei, internal capsule, insula, M1-M3 cortex): 7/7 - Supraganglionic infarction  (M4-M6 cortex): 3/3 Total score (0-10 with 10 being normal): 10/10 IMPRESSION: 1. Left posterior inferior cerebellar nonhemorrhagic infarct is new since the prior study and may be acute. 2. Remote right cerebellar infarct. 3. No acute supratentorial infarct visible. 4. Advanced atrophy and white matter disease is stable. 5. ASPECTS is 10/10 Electronically Signed   By: San Morelle M.D.   On: 02/14/2017 15:22        Scheduled Meds: . aspirin  300 mg Rectal Daily   Or  . aspirin  325 mg Oral Daily  . atorvastatin  80 mg Oral q1800  . metoprolol tartrate  12.5 mg Oral BID   Continuous Infusions: . diltiazem (CARDIZEM) infusion 10 mg/hr (02/15/17 0234)     LOS: 1 day    Time spent: 40 Minutes.   Dana Allan, MD  [facility] Pager #: [phone] 7PM-7AM contact night coverage as above

## 2017-02-15 NOTE — Progress Notes (Addendum)
STROKE TEAM PROGRESS NOTE   SUBJECTIVE (INTERVAL HISTORY) Her sons and daughters are at the bedside.  They recounted HPI with me. Pt started to have clinical depression, behavior changes, paranoia, not eating, drinking, diarrhea. Recently hospitalized for those and found to have afib, prescribed Xarelto. However, after discharge, pt still has above symptoms and not taking any medication at home. This time admitted with aphasia. MRI showed left MCA broca area infarct.    OBJECTIVE Temp:  [97.3 F (36.3 C)-97.9 F (36.6 C)] 97.7 F (36.5 C) (01/26 0700) Pulse Rate:  [30-142] 44 (01/26 0600) Cardiac Rhythm: Atrial fibrillation (01/26 0451) Resp:  [16-28] 17 (01/26 0600) BP: (110-162)/(63-117) 134/71 (01/26 0600) SpO2:  [94 %-99 %] 94 % (01/26 0600) Weight:  [150 lb 2.1 oz (68.1 kg)] 150 lb 2.1 oz (68.1 kg) (01/26 0052)  CBC:  Recent Labs  Lab 02/14/17 1500 02/14/17 1507 02/15/17 0716  WBC 7.7  --  6.7  NEUTROABS 5.5  --   --   HGB 15.4* 17.0* 13.5  HCT 49.9* 50.0* 43.9  MCV 98.2  --  96.7  PLT 136*  --  120*    Basic Metabolic Panel:  Recent Labs  Lab 02/14/17 1500 02/14/17 1507  NA 145 144  K 3.3* 3.2*  CL 99* 98*  CO2 22  --   GLUCOSE 92 91  BUN 22* 26*  CREATININE 1.45* 1.10*  CALCIUM 9.2  --   MG 2.0  --     Lipid Panel:     Component Value Date/Time   CHOL 157 02/15/2017 0231   TRIG 103 02/15/2017 0231   HDL 45 02/15/2017 0231   CHOLHDL 3.5 02/15/2017 0231   VLDL 21 02/15/2017 0231   LDLCALC 91 02/15/2017 0231   HgbA1c:  Lab Results  Component Value Date   HGBA1C 6.3 (H) 02/15/2017   Urine Drug Screen:     Component Value Date/Time   LABOPIA NONE DETECTED 04/04/2015 0215   COCAINSCRNUR NONE DETECTED 04/04/2015 0215   LABBENZ NONE DETECTED 04/04/2015 0215   AMPHETMU NONE DETECTED 04/04/2015 0215   THCU NONE DETECTED 04/04/2015 0215   LABBARB NONE DETECTED 04/04/2015 0215    Alcohol Level     Component Value Date/Time   ETH <5 04/04/2015  0452    IMAGING I have personally reviewed the radiological images below and agree with the radiology interpretations.  Ct Angio Head W Or Wo Contrast Ct Angio Neck W Or Wo Contrast Ct Cerebral Perfusion W Contrast 02/14/2017  IMPRESSION:  1. No emergent large vessel occlusion.  No infarct by CT perfusion.  2. Overall mild atherosclerosis in the head neck. No correctable flow limiting stenosis.  3. Small area of delayed perfusion in the lateral left frontal lobe, possibly reflecting a small embolism not resolved by CTA.  4. Bilateral inferior cerebellar infarcts with a chronic appearance. The left-sided infarct and bilateral poor pica flow has developed since 04/04/2015.   Ct Head Code Stroke Wo Contrast 02/14/2017 IMPRESSION:  1. Left posterior inferior cerebellar nonhemorrhagic infarct is new since the prior study and may be acute.  2. Remote right cerebellar infarct.  3. No acute supratentorial infarct visible.  4. Advanced atrophy and white matter disease is stable.  5. ASPECTS is 10/10   Transthoracic Echocardiogram - Left ventricle: The cavity size was normal. Systolic function was   normal. The estimated ejection fraction was in the range of 55%   to 60%. Wall motion was normal; there were no regional wall  motion abnormalities. - Mitral valve: There was mild regurgitation. - Left atrium: The atrium was mildly dilated. - Atrial septum: There was increased thickness of the septum,   consistent with lipomatous hypertrophy. - Tricuspid valve: There was mild-moderate regurgitation. - Pulmonic valve: There was trivial regurgitation. - Pulmonary arteries: PA peak pressure: 44 mm Hg (S). - Pericardium, extracardiac: A trivial pericardial effusion was   identified posterior to the heart. Impressions: - The right ventricular systolic pressure was increased consistent   with moderate pulmonary hypertension.  MRI  1. 2.6 cm acute infarct in the lateral left frontal lobe.  This correlates with the area of delayed perfusion on CTP yesterday. 2. Large nonacute infarcts of the bilateral inferior cerebellum, progressed on the left since 2017 comparison.   PHYSICAL EXAM Vitals:   02/15/17 0403 02/15/17 0500 02/15/17 0600 02/15/17 0700  BP: 126/70 129/87 134/71   Pulse:  60 (!) 44   Resp:  (!) 21 17   Temp: 97.9 F (36.6 C)   97.7 F (36.5 C)  TempSrc: Oral   Oral  SpO2:  96% 94%   Weight:      Height:        Temp:  [97.3 F (36.3 C)-97.9 F (36.6 C)] 97.7 F (36.5 C) (01/26 0700) Pulse Rate:  [30-142] 44 (01/26 0600) Resp:  [16-28] 17 (01/26 0600) BP: (110-162)/(63-117) 134/71 (01/26 0600) SpO2:  [94 %-99 %] 94 % (01/26 0600) Weight:  [150 lb 2.1 oz (68.1 kg)] 150 lb 2.1 oz (68.1 kg) (01/26 0052)  General - Well nourished, well developed, in no apparent distress, depressed mood.  Ophthalmologic - Fundi not visualized due to noncooperation.  Cardiovascular - irregularly irregular heart rate and rhythm.  Mental Status -  Awake alert, orientated to self, but not able to answer other questions due to aphasia. Able to articulate some sounds but not make sense, followed limited simple commands, such as open mouth, stick out tongue, showing two fingers, however, not able to close eyes, show thumb or fist on command.   Cranial Nerves II - XII - II - Visual field intact OU, blinking to visual threat bilaterally. III, IV, VI - Extraocular movements intact. V - Facial sensation intact bilaterally. VII - Facial movement intact bilaterally. VIII - Hearing & vestibular intact bilaterally. X - Palate elevates symmetrically. XI - Chin turning & shoulder shrug intact bilaterally. XII - Tongue protrusion intact.  Motor Strength - The patient's strength was symmetrical in all extremities and pronator drift was absent.  Bulk was normal and fasciculations were absent.   Motor Tone - Muscle tone was assessed at the neck and appendages and was  normal.  Reflexes - The patient's reflexes were 1+ in all extremities and she had no pathological reflexes.  Sensation, coordination not cooperative, and gait not tested.    ASSESSMENT/PLAN [name] is a 84 y.o. female with history of atrial fibrillation on Xarelto but not compliant, breast cancer, chronic kidney disease, GI bleed history, diabetes mellitus, hypertension, and previous stroke, presenting with aphasia and right facial droop. She did not receive IV t-PA due to possible Xarelto use.  Stroke: Left MCA Broca area infarct likely embolic secondary to atrial fibrillation not on anticoagulation.  Resultant global aphasia  CT head - Lt PICA infarct and remote Rt cerebellar infarct.   MRI head -  2.6 cm acute infarct in the lateral left frontal lobe.  Chronic left and right PICA cerebellar infarcts.  CTA H&N - No emergent large vessel  occlusion.  No infarct by CT perfusion.   2D Echo -EF 55-60%  LDL - 91  HgbA1c - 6.3  VTE prophylaxis - Lovenox  Diet NPO time specified Fall precautions  Not taking Xarelto (rivaroxaban) prior to admission, now on aspirin 325 mg daily.  Plan to resume Xarelto in 2 days, after psychiatry consulted for severe depression  Patient counseled to be compliant with her antithrombotic medications  Ongoing aggressive stroke risk factor management  Therapy recommendations:  pending  Disposition:  Pending  Severe depression with behavior changes  Happened 2 months ago  Patient behavior changes at home, paranoid, hallucinations  Refused to take any medication at home  Has not been seen in psychiatry  Recommend psychiatry consultation  PAF  Diagnosed in 12/2016 - put on Xarelto  Patient not taking at home  Currently rate controlled  On Cardizem IV - transition to po after swallow evaluation  Cardiology on board  History of stroke  03/2015, right PICA infarct, MRA head and CT neck showed right PICA high-grade stenosis,  EF 60-65%, LDL 136 and A1c 6.7 -put on DAPT - recommend 30-day cardio event monitor which was not done  This admission, CT showed interval left PICA infarct  Hypertension  Stable  Permissive hypertension (OK if < 220/120) but gradually normalize in 5-7 days  Long-term BP goal normotensive  Hyperlipidemia  Home meds:  Lipitor 40 mg daily not taking at home  LDL 91, goal < 70  Now on Lipitor 40 mg daily  Continue statin at discharge  Other Stroke Risk Factors  Advanced age  Other Active Problems  Hypokalemia - 3.2 - supplemented  Mild thrombocytopenia - 120K  Hospital day # 1  I spent  35 minutes in total face-to-face time with the patient, more than 50% of which was spent in counseling and coordination of care, reviewing test results, images and medication, and discussing the diagnosis of stroke and A. fib as well as severe depression, treatment plan and potential prognosis. This patient's care requiresreview of multiple databases, neurological assessment, discussion with family, other specialists and medical decision making of high complexity. I had long discussion with sons, daughters and granddaughters at bedside, updated pt current condition, treatment plan and potential prognosis. They expressed understanding and appreciation.  I also discussed with Dr. Marthenia Rolling.    Rosalin Hawking, MD PhD Stroke Neurology 02/15/2017 5:28 PM    To contact Stroke Continuity provider, please refer to [email or web address] After hours, contact General Neurology

## 2017-02-15 NOTE — Progress Notes (Signed)
MRI called around 0640 to close to report and shift change. Called MRI back around 0715 and unable to take her at this time. First shift RN aware.

## 2017-02-15 NOTE — Progress Notes (Signed)
PT Cancellation Note  Patient Details Name: Kendra Maldonado MRN: [id number] DOB: 07/21/1933   Cancelled Treatment:    Reason Eval/Treat Not Completed: Patient at procedure or test/unavailable. Pt off of the floor for MRI. PT will continue to f/u with pt as available.    Clearnce Sorrel Tekela Garguilo 02/15/2017, 1:15 PM

## 2017-02-15 NOTE — Consult Note (Signed)
Admit date: 02/14/2017 Referring Physician  Dr. Marthenia Rolling Primary Physician  Dr. Lavone Orn Primary Cardiologist  none Reason for Consultation  Stroke/afib  HPI: AME HEAGLE is a 84 y.o. female who is being seen today for the evaluation of t atrial fibrillation in setting of acute CVA at the request of Dr. Marthenia Rolling  This is an 84yo female with a history of breast CA, Pagets disease, MGUS, DM2, CKD stage 3 and paroxysmal atrial fibrillation.  She presented to ER with complaints of gait instability, difficulty with speech and expressive aphasia and was seen by her PCP and sent to ER where code stroke was activated.  There was some question as to when her last Xarelto dose was so TPA was not used.  According to family members she had not taken her Xarelto in 3 days.  CT showed left posterior inferior cerebellar nonhemorrhagic CVA that was new and old right cerebellar infarct.  She was noted to be in afib with RVR.  Her Xarelto was stopped and she was started on ASSA 325mg  daily as well as high dose statin.  2D echo and MRI/MRA of brain are pending.  Cardiology is now asked to consult.   Due to patient's expressive aphasia, cannot get a history from her.   PMH:   Past Medical History:  Diagnosis Date  . Back pain   . Breast cancer, left breast ([place]) 2010   lumpectomy  . Cerebrovascular disease    a. 03/2015 - high grade proximal right posterior inferior cerebellar artery stenosis seen on MRA corresponding with distribution of stroke at that time.  . Chronic diarrhea   . CKD (chronic kidney disease), stage III ([place])   . Diabetes mellitus type 2, uncontrolled ([place])   . Essential hypertension   . Hiatal hernia    Stenosis of the spine  . Iron deficiency anemia 01/02/2017  . Lung nodule   . MGUS (monoclonal gammopathy of unknown significance)   . Paget's disease of bone   . Persistent atrial fibrillation ([place])    a. dx 12/2016.  Marland Kitchen Spinal stenosis   . Stroke ([place]) 03/2015  . Symptomatic  anemia   . Upper GI bleed 11/2013     PSH:   Past Surgical History:  Procedure Laterality Date  . BREAST SURGERY    . CHOLECYSTECTOMY    . ESOPHAGOGASTRODUODENOSCOPY (EGD) WITH PROPOFOL N/A 12/10/2013   Procedure: ESOPHAGOGASTRODUODENOSCOPY (EGD) WITH PROPOFOL;  Surgeon: Jeryl Columbia, MD;  Location: [facility] ENDOSCOPY;  Service: Endoscopy;  Laterality: N/A;  . HEMORRHOID SURGERY    . PARTIAL HYSTERECTOMY      Allergies:  Tramadol; Codeine; and Demerol [meperidine] Prior to Admit Meds:   Medications Prior to Admission  Medication Sig Dispense Refill Last Dose  . Cholecalciferol (D 2000) 2000 UNITS TABS Take 2,000 Units by mouth daily.   02/03/17?  Marland Kitchen diclofenac sodium (VOLTAREN) 1 % GEL Apply 1 application topically 3 (three) times daily as needed for pain.   unknown  . diltiazem (CARDIZEM CD) 180 MG 24 hr capsule Take 180 mg by mouth daily.   02/03/17?  Marland Kitchen diphenoxylate-atropine (LOMOTIL) 2.5-0.025 MG tablet Take 2 tablets by mouth every morning. Take an additional dose as needed for chronic diarrhea, if constipated please hold dose (Patient taking differently: Take 2 tablets by mouth See admin instructions. Take 1 tablet (2 mg) by mouth every morning, take an additional dose as needed for chronic diarrhea.  If constipated please hold dose) 30 tablet 0 02/03/17?  Marland Kitchen  escitalopram (LEXAPRO) 5 MG tablet Take 5 mg by mouth daily.   02/03/17?  Marland Kitchen metoprolol tartrate (LOPRESSOR) 100 MG tablet Take 1 tablet (100 mg total) by mouth 2 (two) times daily. 60 tablet 0 02/03/17?  Marland Kitchen omeprazole (PRILOSEC) 20 MG capsule Take 1 capsule (20 mg total) by mouth 2 (two) times daily before a meal. (Patient taking differently: Take 20 mg by mouth 2 (two) times daily as needed (acid reflux/indigestion). )   unknown  . Rivaroxaban (XARELTO) 15 MG TABS tablet Take 1 tablet (15 mg total) by mouth daily with supper. 42 tablet 0 02/03/17?  Marland Kitchen atorvastatin (LIPITOR) 40 MG tablet Take 1 tablet (40 mg total) by mouth daily at 6 PM.  (Patient not taking: Reported on 12/31/2016) 30 tablet 1 Not Taking at Unknown time  . cholestyramine (QUESTRAN) 4 g packet Take 1 packet (4 g total) by mouth 2 (two) times daily. Take other meds 1 hour before or 4-6 hours after cholestyramine (Patient not taking: Reported on 02/14/2017) 60 each 0 Not Taking at Unknown time  . diltiazem (CARDIZEM CD) 240 MG 24 hr capsule Take 1 capsule (240 mg total) by mouth daily. (Patient not taking: Reported on 02/14/2017) 30 capsule 0 Not Taking at Unknown time  . LORazepam (ATIVAN) 0.5 MG tablet Take 1 tablet (0.5 mg total) by mouth 2 (two) times daily as needed for anxiety. (Patient not taking: Reported on 02/14/2017) 10 tablet 0 Not Taking at Unknown time  . traMADol (ULTRAM) 50 MG tablet Take 1 tablet (50 mg total) by mouth every 12 (twelve) hours as needed for moderate pain. (Patient not taking: Reported on 02/14/2017) 12 tablet 0 Not Taking at Unknown time   Fam HX:    Family History  Problem Relation Age of Onset  . Hypertension Mother   . Dementia Father   . Cancer Brother        pancreas   Social HX:    Social History   Socioeconomic History  . Marital status: Married    Spouse name: Not on file  . Number of children: 2  . Years of education: 50  . Highest education level: Not on file  Social Needs  . Financial resource strain: Not on file  . Food insecurity - worry: Not on file  . Food insecurity - inability: Not on file  . Transportation needs - medical: Not on file  . Transportation needs - non-medical: Not on file  Occupational History  . Occupation: Retired    Comment: [employer]  Tobacco Use  . Smoking status: Never Smoker  . Smokeless tobacco: Never Used  Substance and Sexual Activity  . Alcohol use: No  . Drug use: No  . Sexual activity: Not on file  Other Topics Concern  . Not on file  Social History Narrative   Widowed, lives home alone   Has 2 sons and 4 grandchildren   Right-handed   Rarely drinks caffeine      ROS:  All  ROS were addressed and are negative except what is stated in the HPI  Physical Exam: Blood pressure 134/71, pulse (!) 44, temperature 97.7 F (36.5 C), temperature source Oral, resp. rate 17, height 5\' 7"  (1.702 m), weight 150 lb 2.1 oz (68.1 kg), SpO2 94 %.    General: Well developed, well nourished, in no acute distress Head: Eyes PERRLA, No xanthomas.   Normal cephalic and atramatic  Lungs:   Clear bilaterally to auscultation and percussion. Heart:   Irregularly irregular S1  S2 Pulses are 2+ & equal.            No carotid bruit. No JVD.  No abdominal bruits. No femoral bruits. Abdomen: Bowel sounds are positive, abdomen soft and non-tender without masses or                  Hernia's noted. Msk:  Back normal, normal gait. Normal strength and tone for age. Extremities:   No clubbing, cyanosis or edema.  DP +1 Neuro: Alert and oriented X 3. Psych:  Good affect, responds appropriately    Labs:   Lab Results  Component Value Date   WBC 6.7 02/15/2017   HGB 13.5 02/15/2017   HCT 43.9 02/15/2017   MCV 96.7 02/15/2017   PLT 120 (L) 02/15/2017    Recent Labs  Lab 02/15/17 0716  NA 141  K 2.9*  CL 99*  CO2 20*  BUN 13  CREATININE 1.18*  CALCIUM 8.8*  PROT 6.4*  BILITOT 1.2  ALKPHOS 106  ALT 11*  AST 15  GLUCOSE 86   No results found for: PTT Lab Results  Component Value Date   INR 1.15 02/14/2017   INR 1.04 04/04/2015   INR 1.07 12/10/2013   Lab Results  Component Value Date   CKTOTAL 43 10/24/2008   CKMB 1.2 10/24/2008   TROPONINI <0.03 02/15/2017     Lab Results  Component Value Date   CHOL 157 02/15/2017   CHOL 216 (H) 04/04/2015   Lab Results  Component Value Date   HDL 45 02/15/2017   HDL 65 04/04/2015   Lab Results  Component Value Date   LDLCALC 91 02/15/2017   LDLCALC 136 (H) 04/04/2015   Lab Results  Component Value Date   TRIG 103 02/15/2017   TRIG 75 04/04/2015   Lab Results  Component Value Date   CHOLHDL 3.5  02/15/2017   CHOLHDL 3.3 04/04/2015   No results found for: LDLDIRECT    Radiology:  Ct Angio Head W Or Wo Contrast  Result Date: 02/14/2017 CLINICAL DATA:  Aphasia. EXAM: CT ANGIOGRAPHY HEAD AND NECK CT PERFUSION BRAIN TECHNIQUE: Multidetector CT imaging of the head and neck was performed using the standard protocol during bolus administration of intravenous contrast. Multiplanar CT image reconstructions and MIPs were obtained to evaluate the vascular anatomy. Carotid stenosis measurements (when applicable) are obtained utilizing NASCET criteria, using the distal internal carotid diameter as the denominator. Multiphase CT imaging of the brain was performed following IV bolus contrast injection. Subsequent parametric perfusion maps were calculated using RAPID software. CONTRAST:  54mL ISOVUE-370 IOPAMIDOL (ISOVUE-370) INJECTION 76% COMPARISON:  Head CT from earlier today. Brain MRI and MRA 04/04/2015 FINDINGS: CTA NECK FINDINGS Aortic arch: Atherosclerotic plaque. No acute finding. Three vessel branching. Right carotid system: Mild atheromatous changes at the ICA bulb. No stenosis, ulceration, or dissection. Left carotid system: Mild atheromatous changes at the common carotid bifurcation. No stenosis, ulceration, or dissection. Vertebral arteries: There is no proximal subclavian flow limiting stenosis. Mild right vertebral artery dominance. Both vertebral arteries are smooth and widely patent to the dura. Skeleton: No acute or aggressive finding. Other neck: No evidence of mass or inflammation. Upper chest: Negative Review of the MIP images confirms the above findings CTA HEAD FINDINGS Anterior circulation: Atherosclerotic calcification on the carotid siphons. No stenosis or branch occlusion. Negative for aneurysm. Posterior circulation: Right vertebral artery dominance. The vertebrobasilar arteries are smooth and widely patent. Robust and symmetric bilateral PCA flow. Bilateral diminutive picas, especially  proximally,  a notable change since 2017 MRA. Venous sinuses: Patent Anatomic variants: None significant Delayed phase: Not performed in the emergent setting Review of the MIP images confirms the above findings CT Brain Perfusion Findings: CBF (<30%) Volume: 83mL Perfusion (Tmax>6.0s) volume: 12mL Head T-max threshold of greater than 4 seconds there is an area of delayed flow in the lateral left frontal region. IMPRESSION: 1. No emergent large vessel occlusion.  No infarct by CT perfusion. 2. Overall mild atherosclerosis in the head neck. No correctable flow limiting stenosis. 3. Small area of delayed perfusion in the lateral left frontal lobe, possibly reflecting a small embolism not resolved by CTA. 4. Bilateral inferior cerebellar infarcts with a chronic appearance. The left-sided infarct and bilateral poor pica flow has developed since 04/04/2015. Electronically Signed   By: Monte Fantasia M.D.   On: 02/14/2017 16:00   Dg Chest 2 View  Result Date: 02/14/2017 CLINICAL DATA:  Atrial fibrillation and diabetes. Evaluate for pneumonia. Nonsmoker. EXAM: CHEST  2 VIEW COMPARISON:  01/03/2017 FINDINGS: Cardiomegaly with aortic atherosclerosis. Mild pulmonary hyperinflation. No pulmonary consolidations nor overt pulmonary edema. There is minimal atelectasis at the left lung base. Clearing of previous pleural effusions. Axillary clips are seen on the left. No acute nor suspicious osseous abnormalities. IMPRESSION: 1. The lungs are mildly hyperinflated. No alveolar consolidations. Left basilar atelectasis is noted. 2. Aortic atherosclerosis. 3. Stable cardiomegaly. Electronically Signed   By: Ashley Royalty M.D.   On: 02/14/2017 16:44   Ct Angio Neck W Or Wo Contrast  Result Date: 02/14/2017 CLINICAL DATA:  Aphasia. EXAM: CT ANGIOGRAPHY HEAD AND NECK CT PERFUSION BRAIN TECHNIQUE: Multidetector CT imaging of the head and neck was performed using the standard protocol during bolus administration of intravenous contrast.  Multiplanar CT image reconstructions and MIPs were obtained to evaluate the vascular anatomy. Carotid stenosis measurements (when applicable) are obtained utilizing NASCET criteria, using the distal internal carotid diameter as the denominator. Multiphase CT imaging of the brain was performed following IV bolus contrast injection. Subsequent parametric perfusion maps were calculated using RAPID software. CONTRAST:  89mL ISOVUE-370 IOPAMIDOL (ISOVUE-370) INJECTION 76% COMPARISON:  Head CT from earlier today. Brain MRI and MRA 04/04/2015 FINDINGS: CTA NECK FINDINGS Aortic arch: Atherosclerotic plaque. No acute finding. Three vessel branching. Right carotid system: Mild atheromatous changes at the ICA bulb. No stenosis, ulceration, or dissection. Left carotid system: Mild atheromatous changes at the common carotid bifurcation. No stenosis, ulceration, or dissection. Vertebral arteries: There is no proximal subclavian flow limiting stenosis. Mild right vertebral artery dominance. Both vertebral arteries are smooth and widely patent to the dura. Skeleton: No acute or aggressive finding. Other neck: No evidence of mass or inflammation. Upper chest: Negative Review of the MIP images confirms the above findings CTA HEAD FINDINGS Anterior circulation: Atherosclerotic calcification on the carotid siphons. No stenosis or branch occlusion. Negative for aneurysm. Posterior circulation: Right vertebral artery dominance. The vertebrobasilar arteries are smooth and widely patent. Robust and symmetric bilateral PCA flow. Bilateral diminutive picas, especially proximally, a notable change since 2017 MRA. Venous sinuses: Patent Anatomic variants: None significant Delayed phase: Not performed in the emergent setting Review of the MIP images confirms the above findings CT Brain Perfusion Findings: CBF (<30%) Volume: 59mL Perfusion (Tmax>6.0s) volume: 27mL Head T-max threshold of greater than 4 seconds there is an area of delayed flow in  the lateral left frontal region. IMPRESSION: 1. No emergent large vessel occlusion.  No infarct by CT perfusion. 2. Overall mild atherosclerosis in the head neck. No  correctable flow limiting stenosis. 3. Small area of delayed perfusion in the lateral left frontal lobe, possibly reflecting a small embolism not resolved by CTA. 4. Bilateral inferior cerebellar infarcts with a chronic appearance. The left-sided infarct and bilateral poor pica flow has developed since 04/04/2015. Electronically Signed   By: Monte Fantasia M.D.   On: 02/14/2017 16:00   Ct Cerebral Perfusion W Contrast  Result Date: 02/14/2017 CLINICAL DATA:  Aphasia. EXAM: CT ANGIOGRAPHY HEAD AND NECK CT PERFUSION BRAIN TECHNIQUE: Multidetector CT imaging of the head and neck was performed using the standard protocol during bolus administration of intravenous contrast. Multiplanar CT image reconstructions and MIPs were obtained to evaluate the vascular anatomy. Carotid stenosis measurements (when applicable) are obtained utilizing NASCET criteria, using the distal internal carotid diameter as the denominator. Multiphase CT imaging of the brain was performed following IV bolus contrast injection. Subsequent parametric perfusion maps were calculated using RAPID software. CONTRAST:  67mL ISOVUE-370 IOPAMIDOL (ISOVUE-370) INJECTION 76% COMPARISON:  Head CT from earlier today. Brain MRI and MRA 04/04/2015 FINDINGS: CTA NECK FINDINGS Aortic arch: Atherosclerotic plaque. No acute finding. Three vessel branching. Right carotid system: Mild atheromatous changes at the ICA bulb. No stenosis, ulceration, or dissection. Left carotid system: Mild atheromatous changes at the common carotid bifurcation. No stenosis, ulceration, or dissection. Vertebral arteries: There is no proximal subclavian flow limiting stenosis. Mild right vertebral artery dominance. Both vertebral arteries are smooth and widely patent to the dura. Skeleton: No acute or aggressive finding.  Other neck: No evidence of mass or inflammation. Upper chest: Negative Review of the MIP images confirms the above findings CTA HEAD FINDINGS Anterior circulation: Atherosclerotic calcification on the carotid siphons. No stenosis or branch occlusion. Negative for aneurysm. Posterior circulation: Right vertebral artery dominance. The vertebrobasilar arteries are smooth and widely patent. Robust and symmetric bilateral PCA flow. Bilateral diminutive picas, especially proximally, a notable change since 2017 MRA. Venous sinuses: Patent Anatomic variants: None significant Delayed phase: Not performed in the emergent setting Review of the MIP images confirms the above findings CT Brain Perfusion Findings: CBF (<30%) Volume: 27mL Perfusion (Tmax>6.0s) volume: 21mL Head T-max threshold of greater than 4 seconds there is an area of delayed flow in the lateral left frontal region. IMPRESSION: 1. No emergent large vessel occlusion.  No infarct by CT perfusion. 2. Overall mild atherosclerosis in the head neck. No correctable flow limiting stenosis. 3. Small area of delayed perfusion in the lateral left frontal lobe, possibly reflecting a small embolism not resolved by CTA. 4. Bilateral inferior cerebellar infarcts with a chronic appearance. The left-sided infarct and bilateral poor pica flow has developed since 04/04/2015. Electronically Signed   By: Monte Fantasia M.D.   On: 02/14/2017 16:00   Ct Head Code Stroke Wo Contrast  Result Date: 02/14/2017 CLINICAL DATA:  Code stroke.  Right facial droop. EXAM: CT HEAD WITHOUT CONTRAST TECHNIQUE: Contiguous axial images were obtained from the base of the skull through the vertex without intravenous contrast. COMPARISON:  MRI brain 04/03/2016 FINDINGS: Brain: Extensive periventricular and subcortical white matter changes are similar to the prior exam. Posterior inferior left cerebellar hypoattenuation is new since the prior exam. No other acute cortical infarct is evident. A remote  right posterior inferior cerebellar infarct is stable. The insular ribbon is intact. Basal ganglia are stable. The ventricles are proportionate to the degree of atrophy. There is no significant extra-axial fluid collection. Vascular: There is mildly increased attenuation throughout the vascular system. No asymmetric hyperdense vessel is present. Vascular  calcifications are present within the cavernous internal carotid arteries bilaterally. Skull: Calvarium is intact. No focal lytic or blastic lesions are present. Sinuses/Orbits: Polyp or mucous retention cyst is noted anteriorly in the left maxillary sinus. The remaining paranasal sinuses and mastoid air cells are clear. A right lens replacement is present. Globes and orbits are otherwise within normal limits. ASPECTS [facility] Stroke Program Early CT Score) - Ganglionic level infarction (caudate, lentiform nuclei, internal capsule, insula, M1-M3 cortex): 7/7 - Supraganglionic infarction (M4-M6 cortex): 3/3 Total score (0-10 with 10 being normal): 10/10 IMPRESSION: 1. Left posterior inferior cerebellar nonhemorrhagic infarct is new since the prior study and may be acute. 2. Remote right cerebellar infarct. 3. No acute supratentorial infarct visible. 4. Advanced atrophy and white matter disease is stable. 5. ASPECTS is 10/10 Electronically Signed   By: San Morelle M.D.   On: 02/14/2017 15:22     Telemetry     atrial fibrillation with RVR in the 110-120's- Personally Reviewed  ECG    Atrial fibrillation with RVR and occasional PVC - Personally Reviewed   ASSESSMENT/PLAN:   1. Persistent Atrial fibrillation with RVR - she has a history of persistent atrial fibrillation in the past and has been on Xarelto but apparently noncompliant.  Her family says that she has not taken it in over 3 days.  Likely etiology of acute CVA.  She has been taken off her Xarelto and placed on ASA 325mg  daily.   - as she remains in atrial fibrillation with RVR,  recommend starting on IV Heparin gtt or restart Xarelto if ok with Neuro - 2D echo pending - continue Cardizem gtt for rate control.   - will add lopressor 12.5mg  BID to try to get better rate control  2.  HTN - BP controlled currently on IV Cardizem gtt.  Fransico Him, MD  02/15/2017  11:33 AM

## 2017-02-15 NOTE — Evaluation (Signed)
Physical Therapy Evaluation Patient Details Name: Kendra Maldonado MRN: [id number] DOB: 1933/09/25 Today's Date: 02/15/2017   History of Present Illness  Pt is a 84 y.o. female presenting with speech deficits, perseverating, possible receptive aphasia, and gait instability. MRI on 1/26 + for acute infarct in the L frontal lobe. Also noted to have large nonacute infarcts of the bil cerebellum. PMHx: Back pain, Breast cancer, CKD, DM2, Essential HTN, MGUS, Paget's disease, Afib, Spinal stenosis, CVA.    Clinical Impression  Pt presented supine in bed with HOB elevated, awake and willing to participate in therapy session. Pt with expressive and receptive difficulties as well as cognitive deficits throughout. No family or caregivers present to provide any information regarding home environment or PLOF. Pt pleasant and able to follow simple commands. Pt currently requires min guard for bed mobility, min A x2 for transfers and min A x2 to ambulate a very short distance (~5') with 2HHA. All VSS throughout. Recommending CIR level therapies to maximize independence and safety with ADL and functional mobility. Pt would continue to benefit from skilled physical therapy services at this time while admitted and after d/c to address the below listed limitations in order to improve overall safety and independence with functional mobility.      Follow Up Recommendations CIR    Equipment Recommendations  None recommended by PT    Recommendations for Other Services Rehab consult     Precautions / Restrictions Precautions Precautions: Fall Restrictions Weight Bearing Restrictions: No      Mobility  Bed Mobility Overal bed mobility: Needs Assistance Bed Mobility: Supine to Sit     Supine to sit: Min guard;HOB elevated     General bed mobility comments: Cues for initiation but pt able to complete bed mobility without physical assist. Increased time and effort  Transfers Overall transfer level:  Needs assistance Equipment used: 2 person hand held assist Transfers: Sit to/from [facility] Sit to Stand: Min assist;+2 physical assistance Stand pivot transfers: Min assist;+2 physical assistance       General transfer comment: Min assist +2 to boost up from EOB x1, BSC x2. Transferred EOB>BSC>chair.  Ambulation/Gait Ambulation/Gait assistance: Min assist;+2 physical assistance;+2 safety/equipment Ambulation Distance (Feet): 5 Feet Assistive device: 2 person hand held assist Gait Pattern/deviations: Step-to pattern;Step-through pattern;Decreased step length - right;Decreased step length - left;Decreased stride length;Shuffle Gait velocity: decreased Gait velocity interpretation: Below normal speed for age/gender General Gait Details: pt with modest instability requiring constant min A x2 for stability and safety  Stairs            Wheelchair Mobility    Modified Rankin (Stroke Patients Only) Modified Rankin (Stroke Patients Only) Pre-Morbid Rankin Score: Slight disability Modified Rankin: Severe disability     Balance Overall balance assessment: Needs assistance Sitting-balance support: Feet supported Sitting balance-Leahy Scale: Good     Standing balance support: Bilateral upper extremity supported Standing balance-Leahy Scale: Poor                               Pertinent Vitals/Pain Pain Assessment: No/denies pain    Home Living Family/patient expects to be discharged to:: Private residence Living Arrangements: Alone               Additional Comments: Per RN; pt lived alone PTA. Has had a lot of supportive family in and out since shes been in the hospital. Pt unable to provide any further home set up or  PLOF information and no family present.    Prior Function           Comments: Unsure, pt unable to provide and no family present     Hand Dominance   Dominant Hand: Right    Extremity/Trunk Assessment   Upper  Extremity Assessment Upper Extremity Assessment: Defer to OT evaluation    Lower Extremity Assessment Lower Extremity Assessment: Generalized weakness;Difficult to assess due to impaired cognition    Cervical / Trunk Assessment Cervical / Trunk Assessment: Kyphotic  Communication   Communication: Receptive difficulties;Expressive difficulties  Cognition Arousal/Alertness: Awake/alert Behavior During Therapy: Impulsive Overall Cognitive Status: No family/caregiver present to determine baseline cognitive functioning Area of Impairment: Following commands;Safety/judgement;Problem solving                       Following Commands: Follows one step commands consistently Safety/Judgement: Decreased awareness of safety;Decreased awareness of deficits   Problem Solving: Decreased initiation;Difficulty sequencing;Requires verbal cues        General Comments      Exercises     Assessment/Plan    PT Assessment Patient needs continued PT services  PT Problem List Decreased strength;Decreased balance;Decreased mobility;Decreased coordination;Decreased cognition;Decreased knowledge of use of DME;Decreased safety awareness;Decreased knowledge of precautions       PT Treatment Interventions DME instruction;Gait training;Stair training;Functional mobility training;Therapeutic activities;Therapeutic exercise;Balance training;Neuromuscular re-education;Patient/family education    PT Goals (Current goals can be found in the Care Plan section)  Acute Rehab PT Goals Patient Stated Goal: none stated PT Goal Formulation: Patient unable to participate in goal setting Time For Goal Achievement: 03/01/17 Potential to Achieve Goals: Good    Frequency Min 4X/week   Barriers to discharge        Co-evaluation PT/OT/SLP Co-Evaluation/Treatment: Yes Reason for Co-Treatment: Complexity of the patient's impairments (multi-system involvement);For patient/therapist safety;To address  functional/ADL transfers PT goals addressed during session: Mobility/safety with mobility;Balance;Strengthening/ROM OT goals addressed during session: ADL's and self-care       AM-PAC PT "6 Clicks" Daily Activity  Outcome Measure Difficulty turning over in bed (including adjusting bedclothes, sheets and blankets)?: None Difficulty moving from lying on back to sitting on the side of the bed? : None Difficulty sitting down on and standing up from a chair with arms (e.g., wheelchair, bedside commode, etc,.)?: Unable Help needed moving to and from a bed to chair (including a wheelchair)?: A Little Help needed walking in hospital room?: A Little Help needed climbing 3-5 steps with a railing? : A Lot 6 Click Score: 17    End of Session Equipment Utilized During Treatment: Gait belt Activity Tolerance: Patient tolerated treatment well Patient left: in chair;with call bell/phone within reach;with chair alarm set Nurse Communication: Mobility status PT Visit Diagnosis: Other abnormalities of gait and mobility (R26.89);Other symptoms and signs involving the nervous system (R29.898)    Time: 3009-2330 PT Time Calculation (min) (ACUTE ONLY): 24 min   Charges:   PT Evaluation $PT Eval Moderate Complexity: 1 Mod     PT G Codes:        Early, PT, DPT [place] 02/15/2017, 5:16 PM

## 2017-02-15 NOTE — Evaluation (Signed)
Occupational Therapy Evaluation Patient Details Name: Kendra Maldonado MRN: [id number] DOB: Jan 27, 1933 Today's Date: 02/15/2017    History of Present Illness Pt is a 84 y.o. female presenting with speech deficits, perseverating, possible receptive aphasia, and gait instability. MRI on 1/26 + for acute infarct in the L frontal lobe. Also noted to have large nonacute infarcts of the bil cerebellum. PMHx: Back pain, Breast cancer, CKD, DM2, Essential HTN, MGUS, Paget's disease, Afib, Spinal stenosis, CVA.   Clinical Impression   Unsure of PLOF; pt unable to provide and no family present. Currently pt requires min assist +2 for short distance mobility and basic transfers. Pt needs mod-max assist for ADL. Pt presenting with communication and cognitive deficits but does follow one step commands fairly well, impaired balance, and generalized weakness impacting her independence and safety with ADL and functional mobility. Recommending CIR level therapies to maximize independence and safety with ADL and functional mobility. Pt would benefit from continued skilled OT to address established goals.    Follow Up Recommendations  CIR    Equipment Recommendations  Other (comment)(TBD at next venue)    Recommendations for Other Services Rehab consult     Precautions / Restrictions Precautions Precautions: Fall Restrictions Weight Bearing Restrictions: No      Mobility Bed Mobility Overal bed mobility: Needs Assistance Bed Mobility: Supine to Sit     Supine to sit: Min guard;HOB elevated     General bed mobility comments: Cues for initiation but pt able to complete bed mobility without physical assist. Increased time and effort  Transfers Overall transfer level: Needs assistance Equipment used: 2 person hand held assist Transfers: Sit to/from Stand;Stand Pivot Transfers Sit to Stand: Min assist;+2 physical assistance Stand pivot transfers: Min assist;+2 physical assistance       General  transfer comment: Min assist +2 to boost up from EOB x1, BSC x2. Transferred EOB>BSC>chair.    Balance Overall balance assessment: Needs assistance Sitting-balance support: Feet supported Sitting balance-Leahy Scale: Good     Standing balance support: Bilateral upper extremity supported Standing balance-Leahy Scale: Poor                             ADL either performed or assessed with clinical judgement   ADL Overall ADL's : Needs assistance/impaired     Grooming: Moderate assistance;Sitting   Upper Body Bathing: Moderate assistance;Sitting   Lower Body Bathing: Maximal assistance;Sit to/from stand   Upper Body Dressing : Moderate assistance;Sitting   Lower Body Dressing: Maximal assistance;Sit to/from stand   Toilet Transfer: Minimal assistance;+2 for physical assistance;Stand-pivot;BSC(HHA)   Toileting- Clothing Manipulation and Hygiene: Maximal assistance;Sit to/from stand Toileting - Clothing Manipulation Details (indicate cue type and reason): Pt attempting to perform peri care in standing but required max assist to get clean.     Functional mobility during ADLs: Minimal assistance;+2 for physical assistance(for stand pivot only, HHA)       Vision Baseline Vision/History: Wears glasses Additional Comments: Difficult to assess due to impaired communication/cognition. Pt able to read therapists name badge     Perception     Praxis      Pertinent Vitals/Pain Pain Assessment: No/denies pain     Hand Dominance Right   Extremity/Trunk Assessment Upper Extremity Assessment Upper Extremity Assessment: Difficult to assess due to impaired cognition;Generalized weakness   Lower Extremity Assessment Lower Extremity Assessment: Defer to PT evaluation   Cervical / Trunk Assessment Cervical / Trunk Assessment: Kyphotic   Communication  Communication Communication: Receptive difficulties;Expressive difficulties   Cognition Arousal/Alertness:  Awake/alert Behavior During Therapy: Impulsive Overall Cognitive Status: No family/caregiver present to determine baseline cognitive functioning Area of Impairment: Following commands;Safety/judgement;Problem solving                       Following Commands: Follows one step commands consistently Safety/Judgement: Decreased awareness of safety;Decreased awareness of deficits   Problem Solving: Decreased initiation;Difficulty sequencing;Requires verbal cues     General Comments       Exercises     Shoulder Instructions      Home Living Family/patient expects to be discharged to:: Private residence Living Arrangements: Alone                               Additional Comments: Per RN; pt lived alone PTA. Has had a lot of supportive family in and out since shes been in the hospital. Pt unable to provide any further home set up or PLOF information and no family present.      Prior Functioning/Environment          Comments: Unsure, pt unable to provide and no family present        OT Problem List: Decreased strength;Impaired balance (sitting and/or standing);Impaired vision/perception;Decreased coordination;Decreased cognition;Decreased safety awareness;Decreased knowledge of use of DME or AE;Impaired sensation;Impaired UE functional use      OT Treatment/Interventions: Self-care/ADL training;Therapeutic exercise;Neuromuscular education;Energy conservation;Therapeutic activities;Visual/perceptual remediation/compensation;Cognitive remediation/compensation;Patient/family education;Balance training    OT Goals(Current goals can be found in the care plan section) Acute Rehab OT Goals Patient Stated Goal: none stated OT Goal Formulation: With patient Time For Goal Achievement: 03/01/17 Potential to Achieve Goals: Good ADL Goals Pt Will Perform Grooming: with min guard assist;standing Pt Will Perform Upper Body Bathing: with supervision;sitting Pt Will  Perform Lower Body Bathing: with min guard assist;sit to/from stand Pt Will Transfer to Toilet: with min guard assist;bedside commode;ambulating Pt Will Perform Toileting - Clothing Manipulation and hygiene: with min guard assist;sit to/from stand  OT Frequency: Min 3X/week   Barriers to D/C:            Co-evaluation PT/OT/SLP Co-Evaluation/Treatment: Yes Reason for Co-Treatment: Complexity of the patient's impairments (multi-system involvement);For patient/therapist safety;To address functional/ADL transfers   OT goals addressed during session: ADL's and self-care      AM-PAC PT "6 Clicks" Daily Activity     Outcome Measure Help from another person eating meals?: A Lot Help from another person taking care of personal grooming?: A Little Help from another person toileting, which includes using toliet, bedpan, or urinal?: A Lot Help from another person bathing (including washing, rinsing, drying)?: A Lot Help from another person to put on and taking off regular upper body clothing?: A Lot Help from another person to put on and taking off regular lower body clothing?: A Lot 6 Click Score: 13   End of Session Equipment Utilized During Treatment: Gait belt Nurse Communication: Mobility status;Other (comment)(pt with loose BM during sesion)  Activity Tolerance: Patient tolerated treatment well Patient left: in chair;with call bell/phone within reach;with chair alarm set  OT Visit Diagnosis: Unsteadiness on feet (R26.81);Other abnormalities of gait and mobility (R26.89);Muscle weakness (generalized) (M62.81);Cognitive communication deficit (R41.841) Symptoms and signs involving cognitive functions: Cerebral infarction                Time: 7564-3329 OT Time Calculation (min): 22 min Charges:  OT General Charges $OT Visit: 1 Visit  OT Evaluation $OT Eval Moderate Complexity: 1 Mod G-Codes:     Missael Ferrari A. Ulice Brilliant, M.S., OTR/L Pager: [place] 02/15/2017, 5:06 PM

## 2017-02-15 NOTE — Plan of Care (Signed)
Discussed purewick with the patient with some teach back displayed

## 2017-02-16 DIAGNOSIS — Z810 Family history of intellectual disabilities: Secondary | ICD-10-CM

## 2017-02-16 DIAGNOSIS — Z853 Personal history of malignant neoplasm of breast: Secondary | ICD-10-CM

## 2017-02-16 DIAGNOSIS — H9190 Unspecified hearing loss, unspecified ear: Secondary | ICD-10-CM

## 2017-02-16 DIAGNOSIS — F331 Major depressive disorder, recurrent, moderate: Secondary | ICD-10-CM

## 2017-02-16 LAB — CARDIOLIPIN ANTIBODIES, IGG, IGM, IGA
Anticardiolipin IgA: <9 APL U/mL (ref 0–11)
Anticardiolipin IgG: <9 GPL U/mL (ref 0–14)
Anticardiolipin IgM: 32 MPL U/mL — ABNORMAL HIGH (ref 0–12)

## 2017-02-16 LAB — RENAL FUNCTION PANEL
Albumin: 3.5 g/dL (ref 3.5–5.0)
Anion gap: 17 — ABNORMAL HIGH (ref 5–15)
BUN: 16 mg/dL (ref 6–20)
CO2: 25 mmol/L (ref 22–32)
Calcium: 9.4 mg/dL (ref 8.9–10.3)
Chloride: 98 mmol/L — ABNORMAL LOW (ref 101–111)
Creatinine, Ser: 1.31 mg/dL — ABNORMAL HIGH (ref 0.44–1.00)
GFR calc Af Amer: 43 mL/min — ABNORMAL LOW (ref >60)
GFR calc non Af Amer: 37 mL/min — ABNORMAL LOW (ref >60)
Glucose, Bld: 117 mg/dL — ABNORMAL HIGH (ref 65–99)
Phosphorus: 1.9 mg/dL — ABNORMAL LOW (ref 2.5–4.6)
Potassium: 3.8 mmol/L (ref 3.5–5.1)
Sodium: 140 mmol/L (ref 135–145)

## 2017-02-16 LAB — BETA-2-GLYCOPROTEIN I ABS, IGG/M/A
BETA-2-GLYCOPROTEIN I IGA: 10 GPI IgA units (ref 0–25)
BETA-2-GLYCOPROTEIN I IGM: 16 GPI IgM units (ref 0–32)

## 2017-02-16 MED ORDER — DILTIAZEM HCL 30 MG PO TABS: 30.0000 mg | ORAL_TABLET | Freq: Four times a day (QID) | ORAL | Status: DC

## 2017-02-16 MED ORDER — METOPROLOL TARTRATE 12.5 MG HALF TABLET: 12.5000 mg | ORAL_TABLET | Freq: Once | ORAL | Status: DC

## 2017-02-16 MED ORDER — MIRTAZAPINE 15 MG PO TBDP
15.0000 mg | ORAL_TABLET | Freq: Every day | ORAL | Status: DC
  Administered 2017-02-16 – 2017-02-18 (×3): 15 mg via ORAL
  Filled 2017-02-16 (×5): qty 1

## 2017-02-16 MED ORDER — METOPROLOL TARTRATE 25 MG PO TABS
25.0000 mg | ORAL_TABLET | Freq: Two times a day (BID) | ORAL | Status: DC
  Administered 2017-02-16 – 2017-02-17 (×2): 25 mg via ORAL
  Filled 2017-02-16 (×2): qty 1

## 2017-02-16 NOTE — Progress Notes (Signed)
Rehab Admissions Coordinator Note:  Patient was screened by Retta Diones for appropriateness for an Inpatient Acute Rehab Consult.  At this time, we are recommending Inpatient Rehab consult.  Retta Diones 02/16/2017, 8:53 PM  I can be reached at [phone].

## 2017-02-16 NOTE — Evaluation (Signed)
Speech Language Pathology Evaluation Patient Details Name: Kendra Maldonado MRN: [id number] DOB: 07-Jan-1934 Today's Date: 02/16/2017 Time: 1443-1540 SLP Time Calculation (min) (ACUTE ONLY): 33 min  Problem List:  Patient Active Problem List   Diagnosis Date Noted  . Depression   . History of stroke   . Polycythemia 02/14/2017  . Hypokalemia 02/14/2017  . Persistent atrial fibrillation ([place]) 01/27/2017  . Spinal stenosis   . Paget's disease of bone   . Lung nodule   . Essential hypertension   . Hiatal hernia   . Diabetes mellitus type 2, uncontrolled ([place])   . CKD (chronic kidney disease), stage II   . Cerebrovascular disease   . Dysphagia 01/06/2017  . Iron deficiency anemia 01/02/2017  . Atrial fibrillation with rapid ventricular response ([place])   . Diarrhea 12/31/2016  . Rapid atrial fibrillation ([place]) 12/31/2016  . MGUS (monoclonal gammopathy of unknown significance) 05/15/2016  . HLD (hyperlipidemia) 04/05/2015  . Cerebrovascular accident (CVA) due to occlusion of right cerebellar artery ([place])   . Stroke ([place]) 04/04/2015  . Cerebellar stroke, acute ([place]) 04/04/2015  . Hypertension 04/04/2015  . Stroke (cerebrum) ([place]) 04/04/2015  . Type 2 diabetes mellitus with vascular disease ([place]) 04/04/2015  . Anemia due to GI blood loss   . Symptomatic anemia   . Acute posthemorrhagic anemia 12/09/2013  . Upper GI bleed 12/09/2013  . Back pain 12/09/2013  . Chronic diarrhea 12/09/2013  . Breast cancer, left breast ([place]) 01/22/2008   Past Medical History:  Past Medical History:  Diagnosis Date  . Back pain   . Breast cancer, left breast ([place]) 2010   lumpectomy  . Cerebrovascular disease    a. 03/2015 - high grade proximal right posterior inferior cerebellar artery stenosis seen on MRA corresponding with distribution of stroke at that time.  . Chronic diarrhea   . CKD (chronic kidney disease), stage III ([place])   . Diabetes mellitus type 2, uncontrolled ([place])   . Essential  hypertension   . Hiatal hernia    Stenosis of the spine  . Iron deficiency anemia 01/02/2017  . Lung nodule   . MGUS (monoclonal gammopathy of unknown significance)   . Paget's disease of bone   . Persistent atrial fibrillation ([place])    a. dx 12/2016.  Marland Kitchen Spinal stenosis   . Stroke ([place]) 03/2015  . Symptomatic anemia   . Upper GI bleed 11/2013   Past Surgical History:  Past Surgical History:  Procedure Laterality Date  . BREAST SURGERY    . CHOLECYSTECTOMY    . ESOPHAGOGASTRODUODENOSCOPY (EGD) WITH PROPOFOL N/A 12/10/2013   Procedure: ESOPHAGOGASTRODUODENOSCOPY (EGD) WITH PROPOFOL;  Surgeon: Jeryl Columbia, MD;  Location: [facility] ENDOSCOPY;  Service: Endoscopy;  Laterality: N/A;  . HEMORRHOID SURGERY    . PARTIAL HYSTERECTOMY     HPI:  Pt is a 84 y.o. female presenting with speech deficits, perseverating, possible receptive aphasia, and gait instability. MRI on 1/26 + for acute infarct in the L frontal lobe. Also noted to have large nonacute infarcts of the bil cerebellum. PMHx: Back pain, Breast cancer, CKD, DM2, Essential HTN, MGUS, Paget's disease, Afib, Spinal stenosis, CVA.   Assessment / Plan / Recommendation Clinical Impression   Patient presents with aphasia consistent with Wernicke's type, with fluent, empty speech with neologisms, paraphasias and perseveration apparent. Pt generally unaware of errors. Confrontation naming 1/10; pt perseverated her correct answer, "glasses" repeatedly in subsequent naming attempts, including automatic speech. When given the first word in the sequence, pt was able to  count 1-20, name all days of the week, and 4/12 months. Auditory comprehension is impaired for simple, 1 step commands (~50% accuracy without visual cues). When dietary staff arrived to take pt's order, SLP facilitated choice-making with simplified speech (1-2 words per choice) and written cues. Picture description prompted primarily empty speech, interspersed with some real-word paraphasias  ("first amendment" for fish). Suspect at least mild attention if not other cognitive impairments, as pt easily distracted by motion in and outside of her room. She would benefit from skilled ST to address the above impairments; recommending CIR. Education provided to pt/family re: aphasia as well as communication tips for use of visual images/written words to improve auditory comprehension. Will follow acutely.    SLP Assessment  SLP Recommendation/Assessment: Patient needs continued Speech Lanaguage Pathology Services SLP Visit Diagnosis: Aphasia (R47.01);Cognitive communication deficit (R41.841)    Follow Up Recommendations  Inpatient Rehab    Frequency and Duration min 2x/week  2 weeks      SLP Evaluation Cognition  Overall Cognitive Status: Difficult to assess Arousal/Alertness: Awake/alert Orientation Level: Oriented to person;Disoriented to place;Disoriented to time;Disoriented to situation Attention: Selective Sustained Attention: Appears intact Selective Attention: Impaired Selective Attention Impairment: Functional basic(pt distracted by movement in and outside room)       Comprehension  Auditory Comprehension Overall Auditory Comprehension: Impaired Yes/No Questions: Impaired Basic Biographical Questions: 76-100% accurate Basic Immediate Environment Questions: 50-74% accurate Complex Questions: 50-74% accurate Commands: Impaired One Step Basic Commands: 50-74% accurate Two Step Basic Commands: 0-24% accurate Conversation: Simple EffectiveTechniques: Repetition;Stressing words;Visual/Gestural cues Visual Recognition/Discrimination Discrimination: Within Function Limits Reading Comprehension Reading Status: Not tested    Expression Expression Primary Mode of Expression: Verbal Verbal Expression Overall Verbal Expression: Impaired Initiation: No impairment Automatic Speech: Name;Counting;Day of week(cues required) Level of Generative/Spontaneous Verbalization:  Conversation Repetition: Impaired Level of Impairment: Phrase level Naming: Impairment Confrontation: Impaired Convergent: 0-24% accurate Divergent: Not tested Verbal Errors: Neologisms;Perseveration;Jargon;Aware of errors;Not aware of errors;Semantic paraphasias;Phonemic paraphasias Pragmatics: No impairment Non-Verbal Means of Communication: Communication board(communication board given) Written Expression Dominant Hand: Right Written Expression: Not tested   Oral / Motor  Oral Motor/Sensory Function Overall Oral Motor/Sensory Function: Within functional limits Motor Speech Overall Motor Speech: Appears within functional limits for tasks assessed Respiration: Within functional limits Articulation: Within functional limitis Intelligibility: Intelligible Motor Planning: Witnin functional limits   [place], [place], [place] Pathologist [phone]  Aliene Altes 02/16/2017, 2:57 PM

## 2017-02-16 NOTE — Progress Notes (Signed)
Pt admitted to unit via bed from 3MW. Pt alert and oriented to person and place. Will continue to monitor.

## 2017-02-16 NOTE — Progress Notes (Addendum)
1000: pt posey belt taken off, family at bedside, patient not trying to get OOB or interfere with treatment.  1151: cardizem gtt paused, Cardiology PA notified, HR 57, BP 107/70. New orders received from [place], [place].

## 2017-02-16 NOTE — Progress Notes (Signed)
PROGRESS NOTE    Kendra Maldonado  OEH:[id number] DOB: [birth year] DOA: 02/14/2017 PCP: Lavone Orn, MD  Outpatient Specialists:   Brief Narrative:   Patient is an 84 y.o. female, w h/o Breast cancer, Pagets disease, Mgus, Dm2 with CKD stage 3, Pafib, CVA.  Patient presented with aphasia, facial asymmetry, and gait instability.  Patient was seen by neurology on presentation, but there was no need for TPA. Pt's family thinks that she has not taken her xarelto in the past 3 days.  According to the patient's son, the patient has been depressed, therefore, has not been taking her medication.  If he just seems to be improving, but the patient remains significantly dysarthric.  Facial asymmetry also persists.  Patient is back to her Eliquis.  His speech therapy is currently evaluating patient's swallowing.  The case was discussed with the neurologist.  Patient remains in atrial fibrillation.  02/16/2017-patient seen alongside patient's nurse.  No new complaints.  Patient is not at her baseline.  Patient's dysarthria is improving.  A. fib is rate controlled.  Discussed with neurology team, he should be okay to restart patient on anticoagulation tomorrow.  Xarelto will be started tomorrow so long time, the patient be able to take the anticoagulation once daily.  Due to the patient's depressive illness, the patient has not been compliant with medication.  Psychiatry has been consulted.  I also discussed the case personally with the psychiatrist.   Assessment & Plan:   Principal Problem:   Stroke [facility]) Active Problems:   Atrial fibrillation with rapid ventricular response ([place])   Polycythemia   Hypokalemia   Depression   History of stroke   Stroke: We will start Xarelto in the morning.   Appreciate neurology input.   Follow the results of the speech evaluation. Supportive management. Need to comply with medication was discussed with the patient extensively.  Atrial fibrillation with rapid  ventricular response: Heart rate is controlled. Patient is to start anticoagulation in the morning. Will defer management of the atrial fibrillation to the cardiology team.  Depression: This seems to be recurrent. Psychiatric input is appreciated.  DVT prophylaxis: Subcu Lovenox. Code Status: Full Family Communication:  Disposition Plan: Will depend on hospital course   Consultants:   Neurology  Cardiology  Procedures:     Antimicrobials:       Subjective: Dysarthria is improving. Patient is not back to her baseline.  Objective: Vitals:   02/15/17 2145 02/15/17 2352 02/16/17 0335 02/16/17 0748  BP: 119/70 (!) 150/74 123/68   Pulse: 79 78    Resp:      Temp:  97.9 F (36.6 C) (!) 97.5 F (36.4 C) 97.6 F (36.4 C)  TempSrc:  Oral Oral Oral  SpO2:  97%    Weight:      Height:        Intake/Output Summary (Last 24 hours) at 02/16/2017 1008 Last data filed at 02/16/2017 0940 Gross per 24 hour  Intake 420 ml  Output 2 ml  Net 418 ml   Filed Weights   02/15/17 0052  Weight: 68.1 kg (150 lb 2.1 oz)    Examination:  General exam: Appears calm and comfortable. Facial asymmetry.  Dysarthric. Respiratory system: Clear to auscultation.  Cardiovascular system: S1 & S2, irregular.   Gastrointestinal system: Abdomen is nondistended, soft and nontender. No organomegaly or masses felt. Normal bowel sounds heard. Central nervous system: Alert and oriented. Facial asymmetry. Dysarthric. Moves all limbs Extremities: No edema.  Data Reviewed: I have  personally reviewed following labs and imaging studies  CBC: Recent Labs  Lab 02/14/17 1500 02/14/17 1507 02/15/17 0716  WBC 7.7  --  6.7  NEUTROABS 5.5  --   --   HGB 15.4* 17.0* 13.5  HCT 49.9* 50.0* 43.9  MCV 98.2  --  96.7  PLT 136*  --  035*   Basic Metabolic Panel: Recent Labs  Lab 02/14/17 1500 02/14/17 1507 02/15/17 0716 02/16/17 0325  NA 145 144 141 140  K 3.3* 3.2* 2.9* 3.8  CL 99* 98* 99*  98*  CO2 22  --  20* 25  GLUCOSE 92 91 86 117*  BUN 22* 26* 13 16  CREATININE 1.45* 1.10* 1.18* 1.31*  CALCIUM 9.2  --  8.8* 9.4  MG 2.0  --  2.0  --   PHOS  --   --   --  1.9*   GFR: Estimated Creatinine Clearance: 32.8 mL/min (A) (by C-G formula based on SCr of 1.31 mg/dL (H)). Liver Function Tests: Recent Labs  Lab 02/14/17 1500 02/15/17 0716 02/16/17 0325  AST 19 15  --   ALT 12* 11*  --   ALKPHOS 129* 106  --   BILITOT 1.6* 1.2  --   PROT 7.2 6.4*  --   ALBUMIN 4.0 3.5 3.5   No results for input(s): LIPASE, AMYLASE in the last 168 hours. No results for input(s): AMMONIA in the last 168 hours. Coagulation Profile: Recent Labs  Lab 02/14/17 1500  INR 1.15   Cardiac Enzymes: Recent Labs  Lab 02/14/17 2108 02/15/17 0231 02/15/17 0716  TROPONINI <0.03 <0.03 <0.03   BNP (last 3 results) No results for input(s): PROBNP in the last 8760 hours. HbA1C: Recent Labs    02/15/17 0231  HGBA1C 6.3*   CBG: No results for input(s): GLUCAP in the last 168 hours. Lipid Profile: Recent Labs    02/15/17 0231  CHOL 157  HDL 45  LDLCALC 91  TRIG 103  CHOLHDL 3.5   Thyroid Function Tests: Recent Labs    02/15/17 0231  TSH 1.836   Anemia Panel: Recent Labs    02/15/17 0716  VITAMINB12 1,347*   Urine analysis:    Component Value Date/Time   COLORURINE YELLOW 12/31/2016 1215   APPEARANCEUR CLEAR 12/31/2016 1215   LABSPEC 1.015 12/31/2016 1215   PHURINE 5.0 12/31/2016 1215   GLUCOSEU NEGATIVE 12/31/2016 1215   HGBUR NEGATIVE 12/31/2016 1215   BILIRUBINUR NEGATIVE 12/31/2016 1215   KETONESUR 5 (A) 12/31/2016 1215   PROTEINUR NEGATIVE 12/31/2016 1215   UROBILINOGEN 0.2 03/12/2007 1413   NITRITE NEGATIVE 12/31/2016 1215   LEUKOCYTESUR NEGATIVE 12/31/2016 1215   Sepsis Labs: @LABRCNTIP (procalcitonin:4,lacticidven:4)  ) Recent Results (from the past 240 hour(s))  MRSA PCR Screening     Status: None   Collection Time: 02/15/17  2:14 AM  Result Value  Ref Range Status   MRSA by PCR NEGATIVE NEGATIVE Final    Comment:        The GeneXpert MRSA Assay (FDA approved for NASAL specimens only), is one component of a comprehensive MRSA colonization surveillance program. It is not intended to diagnose MRSA infection nor to guide or monitor treatment for MRSA infections.          Radiology Studies: Ct Angio Head W Or Wo Contrast  Result Date: 02/14/2017 CLINICAL DATA:  Aphasia. EXAM: CT ANGIOGRAPHY HEAD AND NECK CT PERFUSION BRAIN TECHNIQUE: Multidetector CT imaging of the head and neck was performed using the standard protocol during bolus administration  of intravenous contrast. Multiplanar CT image reconstructions and MIPs were obtained to evaluate the vascular anatomy. Carotid stenosis measurements (when applicable) are obtained utilizing NASCET criteria, using the distal internal carotid diameter as the denominator. Multiphase CT imaging of the brain was performed following IV bolus contrast injection. Subsequent parametric perfusion maps were calculated using RAPID software. CONTRAST:  31mL ISOVUE-370 IOPAMIDOL (ISOVUE-370) INJECTION 76% COMPARISON:  Head CT from earlier today. Brain MRI and MRA 04/04/2015 FINDINGS: CTA NECK FINDINGS Aortic arch: Atherosclerotic plaque. No acute finding. Three vessel branching. Right carotid system: Mild atheromatous changes at the ICA bulb. No stenosis, ulceration, or dissection. Left carotid system: Mild atheromatous changes at the common carotid bifurcation. No stenosis, ulceration, or dissection. Vertebral arteries: There is no proximal subclavian flow limiting stenosis. Mild right vertebral artery dominance. Both vertebral arteries are smooth and widely patent to the dura. Skeleton: No acute or aggressive finding. Other neck: No evidence of mass or inflammation. Upper chest: Negative Review of the MIP images confirms the above findings CTA HEAD FINDINGS Anterior circulation: Atherosclerotic calcification on  the carotid siphons. No stenosis or branch occlusion. Negative for aneurysm. Posterior circulation: Right vertebral artery dominance. The vertebrobasilar arteries are smooth and widely patent. Robust and symmetric bilateral PCA flow. Bilateral diminutive picas, especially proximally, a notable change since 2017 MRA. Venous sinuses: Patent Anatomic variants: None significant Delayed phase: Not performed in the emergent setting Review of the MIP images confirms the above findings CT Brain Perfusion Findings: CBF (<30%) Volume: 63mL Perfusion (Tmax>6.0s) volume: 38mL Head T-max threshold of greater than 4 seconds there is an area of delayed flow in the lateral left frontal region. IMPRESSION: 1. No emergent large vessel occlusion.  No infarct by CT perfusion. 2. Overall mild atherosclerosis in the head neck. No correctable flow limiting stenosis. 3. Small area of delayed perfusion in the lateral left frontal lobe, possibly reflecting a small embolism not resolved by CTA. 4. Bilateral inferior cerebellar infarcts with a chronic appearance. The left-sided infarct and bilateral poor pica flow has developed since 04/04/2015. Electronically Signed   By: Monte Fantasia M.D.   On: 02/14/2017 16:00   Dg Chest 2 View  Result Date: 02/14/2017 CLINICAL DATA:  Atrial fibrillation and diabetes. Evaluate for pneumonia. Nonsmoker. EXAM: CHEST  2 VIEW COMPARISON:  01/03/2017 FINDINGS: Cardiomegaly with aortic atherosclerosis. Mild pulmonary hyperinflation. No pulmonary consolidations nor overt pulmonary edema. There is minimal atelectasis at the left lung base. Clearing of previous pleural effusions. Axillary clips are seen on the left. No acute nor suspicious osseous abnormalities. IMPRESSION: 1. The lungs are mildly hyperinflated. No alveolar consolidations. Left basilar atelectasis is noted. 2. Aortic atherosclerosis. 3. Stable cardiomegaly. Electronically Signed   By: Ashley Royalty M.D.   On: 02/14/2017 16:44   Ct Angio Neck W  Or Wo Contrast  Result Date: 02/14/2017 CLINICAL DATA:  Aphasia. EXAM: CT ANGIOGRAPHY HEAD AND NECK CT PERFUSION BRAIN TECHNIQUE: Multidetector CT imaging of the head and neck was performed using the standard protocol during bolus administration of intravenous contrast. Multiplanar CT image reconstructions and MIPs were obtained to evaluate the vascular anatomy. Carotid stenosis measurements (when applicable) are obtained utilizing NASCET criteria, using the distal internal carotid diameter as the denominator. Multiphase CT imaging of the brain was performed following IV bolus contrast injection. Subsequent parametric perfusion maps were calculated using RAPID software. CONTRAST:  82mL ISOVUE-370 IOPAMIDOL (ISOVUE-370) INJECTION 76% COMPARISON:  Head CT from earlier today. Brain MRI and MRA 04/04/2015 FINDINGS: CTA NECK FINDINGS Aortic arch: Atherosclerotic plaque.  No acute finding. Three vessel branching. Right carotid system: Mild atheromatous changes at the ICA bulb. No stenosis, ulceration, or dissection. Left carotid system: Mild atheromatous changes at the common carotid bifurcation. No stenosis, ulceration, or dissection. Vertebral arteries: There is no proximal subclavian flow limiting stenosis. Mild right vertebral artery dominance. Both vertebral arteries are smooth and widely patent to the dura. Skeleton: No acute or aggressive finding. Other neck: No evidence of mass or inflammation. Upper chest: Negative Review of the MIP images confirms the above findings CTA HEAD FINDINGS Anterior circulation: Atherosclerotic calcification on the carotid siphons. No stenosis or branch occlusion. Negative for aneurysm. Posterior circulation: Right vertebral artery dominance. The vertebrobasilar arteries are smooth and widely patent. Robust and symmetric bilateral PCA flow. Bilateral diminutive picas, especially proximally, a notable change since 2017 MRA. Venous sinuses: Patent Anatomic variants: None significant  Delayed phase: Not performed in the emergent setting Review of the MIP images confirms the above findings CT Brain Perfusion Findings: CBF (<30%) Volume: 23mL Perfusion (Tmax>6.0s) volume: 56mL Head T-max threshold of greater than 4 seconds there is an area of delayed flow in the lateral left frontal region. IMPRESSION: 1. No emergent large vessel occlusion.  No infarct by CT perfusion. 2. Overall mild atherosclerosis in the head neck. No correctable flow limiting stenosis. 3. Small area of delayed perfusion in the lateral left frontal lobe, possibly reflecting a small embolism not resolved by CTA. 4. Bilateral inferior cerebellar infarcts with a chronic appearance. The left-sided infarct and bilateral poor pica flow has developed since 04/04/2015. Electronically Signed   By: Monte Fantasia M.D.   On: 02/14/2017 16:00   Mr Brain Wo Contrast  Result Date: 02/15/2017 CLINICAL DATA:  Altered level of consciousness.  Receptive aphasia. EXAM: MRI HEAD WITHOUT CONTRAST TECHNIQUE: Multiplanar, multiecho pulse sequences of the brain and surrounding structures were obtained without intravenous contrast. COMPARISON:  CTA and cerebral perfusion from yesterday FINDINGS: Brain: In the area of previously noted delayed perfusion is a lateral left frontal infarct measuring up to 2.6 cm, cortical and white matter. No acute infarct in alternate distribution. No hemorrhagic conversion. Extensive bilateral inferior cerebellar infarction that has progressed on the left when compared to 04/04/2015 brain MRI. There is T1 hyperintensity within the left cerebellar infarct distribution consistent with cortical laminar necrosis or nonacute blood products. Moderate chronic small vessel ischemic type change in the cerebral white matter. No hydrocephalus or masslike finding. Vascular: Major flow voids are preserved. Skull and upper cervical spine: Negative for marrow lesion Sinuses/Orbits: Right cataract resection. Small left maxillary mucous  retention cysts. Mild bilateral mastoid opacification. IMPRESSION: 1. 2.6 cm acute infarct in the lateral left frontal lobe. This correlates with the area of delayed perfusion on CTP yesterday. 2. Large nonacute infarcts of the bilateral inferior cerebellum, progressed on the left since 2017 comparison. Electronically Signed   By: Monte Fantasia M.D.   On: 02/15/2017 13:05   Ct Cerebral Perfusion W Contrast  Result Date: 02/14/2017 CLINICAL DATA:  Aphasia. EXAM: CT ANGIOGRAPHY HEAD AND NECK CT PERFUSION BRAIN TECHNIQUE: Multidetector CT imaging of the head and neck was performed using the standard protocol during bolus administration of intravenous contrast. Multiplanar CT image reconstructions and MIPs were obtained to evaluate the vascular anatomy. Carotid stenosis measurements (when applicable) are obtained utilizing NASCET criteria, using the distal internal carotid diameter as the denominator. Multiphase CT imaging of the brain was performed following IV bolus contrast injection. Subsequent parametric perfusion maps were calculated using RAPID software. CONTRAST:  61mL  ISOVUE-370 IOPAMIDOL (ISOVUE-370) INJECTION 76% COMPARISON:  Head CT from earlier today. Brain MRI and MRA 04/04/2015 FINDINGS: CTA NECK FINDINGS Aortic arch: Atherosclerotic plaque. No acute finding. Three vessel branching. Right carotid system: Mild atheromatous changes at the ICA bulb. No stenosis, ulceration, or dissection. Left carotid system: Mild atheromatous changes at the common carotid bifurcation. No stenosis, ulceration, or dissection. Vertebral arteries: There is no proximal subclavian flow limiting stenosis. Mild right vertebral artery dominance. Both vertebral arteries are smooth and widely patent to the dura. Skeleton: No acute or aggressive finding. Other neck: No evidence of mass or inflammation. Upper chest: Negative Review of the MIP images confirms the above findings CTA HEAD FINDINGS Anterior circulation:  Atherosclerotic calcification on the carotid siphons. No stenosis or branch occlusion. Negative for aneurysm. Posterior circulation: Right vertebral artery dominance. The vertebrobasilar arteries are smooth and widely patent. Robust and symmetric bilateral PCA flow. Bilateral diminutive picas, especially proximally, a notable change since 2017 MRA. Venous sinuses: Patent Anatomic variants: None significant Delayed phase: Not performed in the emergent setting Review of the MIP images confirms the above findings CT Brain Perfusion Findings: CBF (<30%) Volume: 17mL Perfusion (Tmax>6.0s) volume: 68mL Head T-max threshold of greater than 4 seconds there is an area of delayed flow in the lateral left frontal region. IMPRESSION: 1. No emergent large vessel occlusion.  No infarct by CT perfusion. 2. Overall mild atherosclerosis in the head neck. No correctable flow limiting stenosis. 3. Small area of delayed perfusion in the lateral left frontal lobe, possibly reflecting a small embolism not resolved by CTA. 4. Bilateral inferior cerebellar infarcts with a chronic appearance. The left-sided infarct and bilateral poor pica flow has developed since 04/04/2015. Electronically Signed   By: Monte Fantasia M.D.   On: 02/14/2017 16:00   Ct Head Code Stroke Wo Contrast  Result Date: 02/14/2017 CLINICAL DATA:  Code stroke.  Right facial droop. EXAM: CT HEAD WITHOUT CONTRAST TECHNIQUE: Contiguous axial images were obtained from the base of the skull through the vertex without intravenous contrast. COMPARISON:  MRI brain 04/03/2016 FINDINGS: Brain: Extensive periventricular and subcortical white matter changes are similar to the prior exam. Posterior inferior left cerebellar hypoattenuation is new since the prior exam. No other acute cortical infarct is evident. A remote right posterior inferior cerebellar infarct is stable. The insular ribbon is intact. Basal ganglia are stable. The ventricles are proportionate to the degree of  atrophy. There is no significant extra-axial fluid collection. Vascular: There is mildly increased attenuation throughout the vascular system. No asymmetric hyperdense vessel is present. Vascular calcifications are present within the cavernous internal carotid arteries bilaterally. Skull: Calvarium is intact. No focal lytic or blastic lesions are present. Sinuses/Orbits: Polyp or mucous retention cyst is noted anteriorly in the left maxillary sinus. The remaining paranasal sinuses and mastoid air cells are clear. A right lens replacement is present. Globes and orbits are otherwise within normal limits. ASPECTS [facility] Stroke Program Early CT Score) - Ganglionic level infarction (caudate, lentiform nuclei, internal capsule, insula, M1-M3 cortex): 7/7 - Supraganglionic infarction (M4-M6 cortex): 3/3 Total score (0-10 with 10 being normal): 10/10 IMPRESSION: 1. Left posterior inferior cerebellar nonhemorrhagic infarct is new since the prior study and may be acute. 2. Remote right cerebellar infarct. 3. No acute supratentorial infarct visible. 4. Advanced atrophy and white matter disease is stable. 5. ASPECTS is 10/10 Electronically Signed   By: San Morelle M.D.   On: 02/14/2017 15:22        Scheduled Meds: . aspirin  300 mg Rectal Daily   Or  . aspirin  325 mg Oral Daily  . atorvastatin  40 mg Oral q1800  . enoxaparin (LOVENOX) injection  40 mg Subcutaneous Q24H  . metoprolol tartrate  12.5 mg Oral BID   Continuous Infusions: . diltiazem (CARDIZEM) infusion 10 mg/hr (02/16/17 0606)     LOS: 2 days    Time spent: 40 Minutes.   Dana Allan, MD  [facility] Pager #: [phone] 7PM-7AM contact night coverage as above

## 2017-02-16 NOTE — Consult Note (Signed)
Kendra Maldonado Psychiatry Consult   Reason for Consult:  Severe depression Referring Physician:  Dr. Marthenia Rolling Patient Identification: Kendra Maldonado MRN:  [id number] Principal Diagnosis: Stroke [facility]) Diagnosis:   Patient Active Problem List   Diagnosis Date Noted  . Depression [F32.9]   . History of stroke [Z86.73]   . Polycythemia [D75.1] 02/14/2017  . Hypokalemia [E87.6] 02/14/2017  . Persistent atrial fibrillation ([place]) [I48.1] 01/27/2017  . Spinal stenosis [M48.00]   . 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 Lung nodule [R91.1]   . Essential hypertension [I10]   . Hiatal hernia [K44.9]   . Diabetes mellitus type 2, uncontrolled ([place]) [E11.65]   . CKD (chronic kidney disease), stage II [N18.2]   . Cerebrovascular disease [I67.9]   . Dysphagia [R13.10] 01/06/2017  . Iron deficiency anemia [D50.9] 01/02/2017  . Atrial fibrillation with rapid ventricular response ([place]) [I48.91]   . Diarrhea [R19.7] 12/31/2016  . Rapid atrial fibrillation ([place]) [I48.91] 12/31/2016  . MGUS (monoclonal gammopathy of unknown significance) [D47.2] 05/15/2016  . HLD (hyperlipidemia) [E78.5] 04/05/2015  . Cerebrovascular accident (CVA) due to occlusion of right cerebellar artery ([place]) [I63.541]   . Stroke ([place]) [I63.9] 04/04/2015  . Cerebellar stroke, acute ([place]) [I63.9] 04/04/2015  . Hypertension [I10] 04/04/2015  . Stroke (cerebrum) ([place]) [I63.9] 04/04/2015  . Type 2 diabetes mellitus with vascular disease ([place]) [E11.59] 04/04/2015  . Anemia due to GI blood loss [D50.0]   . Symptomatic anemia [D64.9]   . Acute posthemorrhagic anemia [D62] 12/09/2013  . Upper GI bleed [K92.2] 12/09/2013  . Back pain [M54.9] 12/09/2013  . Chronic diarrhea [K52.9] 12/09/2013  . Breast cancer, left breast ([place]) [C50.912] 01/22/2008    Total Time spent with patient: 45 minutes  Subjective:   Kendra Maldonado is a 84 y.o. female patient admitted with speech deficit and gait instability  HPI:   Patient with history  of Breast cancer, Pagets disease, Mgus, Dm2 with CKD stage 3, Pafib, CVA who was admitted with aphasia, facial asymmetry, and gait instability.  Patient is a poor historian but she was interviewed in the presence of her sister in law who confirmed patient was diagnosed with depression in the past but has not been compliant with her medication. Patient reports being lonely, depressed, hopeless, poor appetite, sleep problem and family members reports that she has been socially withdrawn. She denies suicidal thoughts, psychosis or delusions. She states that she will take medication for depression if given to her.   Past Psychiatric History: as above  Risk to Self: Is patient at risk for suicide?: No Risk to Others:   Prior Inpatient Therapy:   Prior Outpatient Therapy:    Past Medical History:  Past Medical History:  Diagnosis Date  . Back pain   . Breast cancer, left breast ([place]) 2010   lumpectomy  . Cerebrovascular disease    a. 03/2015 - high grade proximal right posterior inferior cerebellar artery stenosis seen on MRA corresponding with distribution of stroke at that time.  . Chronic diarrhea   . CKD (chronic kidney disease), stage III ([place])   . Diabetes mellitus type 2, uncontrolled ([place])   . Essential hypertension   . Hiatal hernia    Stenosis of the spine  . Iron deficiency anemia 01/02/2017  . Lung nodule   . MGUS (monoclonal gammopathy of unknown significance)   . Paget's disease of bone   . Persistent atrial fibrillation ([place])    a. dx 12/2016.  Marland Kitchen Spinal stenosis   . Stroke [facility])  03/2015  . Symptomatic anemia   . Upper GI bleed 11/2013    Past Surgical History:  Procedure Laterality Date  . BREAST SURGERY    . CHOLECYSTECTOMY    . ESOPHAGOGASTRODUODENOSCOPY (EGD) WITH PROPOFOL N/A 12/10/2013   Procedure: ESOPHAGOGASTRODUODENOSCOPY (EGD) WITH PROPOFOL;  Surgeon: Kendra Columbia, MD;  Location: [facility] ENDOSCOPY;  Service: Endoscopy;  Laterality: N/A;  . HEMORRHOID SURGERY    .  PARTIAL HYSTERECTOMY     Family History:  Family History  Problem Relation Age of Onset  . Hypertension Mother   . Dementia Father   . Cancer Brother        pancreas   Family Psychiatric  History:  Social History:  Social History   Substance and Sexual Activity  Alcohol Use No     Social History   Substance and Sexual Activity  Drug Use No    Social History   Socioeconomic History  . Marital status: Married    Spouse name: None  . Number of children: 2  . Years of education: 32  . Highest education level: None  Social Needs  . Financial resource strain: None  . Food insecurity - worry: None  . Food insecurity - inability: None  . Transportation needs - medical: None  . Transportation needs - non-medical: None  Occupational History  . Occupation: Retired    Comment: [employer]  Tobacco Use  . Smoking status: Never Smoker  . Smokeless tobacco: Never Used  Substance and Sexual Activity  . Alcohol use: No  . Drug use: No  . Sexual activity: None  Other Topics Concern  . None  Social History Narrative   Widowed, lives home alone   Has 2 sons and 4 grandchildren   Right-handed   Rarely drinks caffeine   Additional Social History:    Allergies:   Allergies  Allergen Reactions  . Tramadol Other (See Comments)    dizziness, "WENT OUT"  . Codeine Rash  . Demerol [Meperidine] Rash    Labs:  Results for orders placed or performed during the hospital encounter of 02/14/17 (from the past 48 hour(s))  Protime-INR     Status: None   Collection Time: 02/14/17  3:00 PM  Result Value Ref Range   Prothrombin Time 14.6 11.4 - 15.2 seconds   INR 1.15   APTT     Status: None   Collection Time: 02/14/17  3:00 PM  Result Value Ref Range   aPTT 30 24 - 36 seconds  CBC     Status: Abnormal   Collection Time: 02/14/17  3:00 PM  Result Value Ref Range   WBC 7.7 4.0 - 10.5 K/uL   RBC 5.08 3.87 - 5.11 MIL/uL   Hemoglobin 15.4 (H) 12.0 - 15.0 g/dL   HCT 49.9 (H)  36.0 - 46.0 %   MCV 98.2 78.0 - 100.0 fL   MCH 30.3 26.0 - 34.0 pg   MCHC 30.9 30.0 - 36.0 g/dL   RDW 13.5 11.5 - 15.5 %   Platelets 136 (L) 150 - 400 K/uL  Differential     Status: None   Collection Time: 02/14/17  3:00 PM  Result Value Ref Range   Neutrophils Relative % 71 %   Neutro Abs 5.5 1.7 - 7.7 K/uL   Lymphocytes Relative 20 %   Lymphs Abs 1.5 0.7 - 4.0 K/uL   Monocytes Relative 8 %   Monocytes Absolute 0.6 0.1 - 1.0 K/uL   Eosinophils Relative 1 %  Eosinophils Absolute 0.1 0.0 - 0.7 K/uL   Basophils Relative 0 %   Basophils Absolute 0.0 0.0 - 0.1 K/uL  Comprehensive metabolic panel     Status: Abnormal   Collection Time: 02/14/17  3:00 PM  Result Value Ref Range   Sodium 145 135 - 145 mmol/L   Potassium 3.3 (L) 3.5 - 5.1 mmol/L   Chloride 99 (L) 101 - 111 mmol/L   CO2 22 22 - 32 mmol/L   Glucose, Bld 92 65 - 99 mg/dL   BUN 22 (H) 6 - 20 mg/dL   Creatinine, Ser 1.45 (H) 0.44 - 1.00 mg/dL   Calcium 9.2 8.9 - 10.3 mg/dL   Total Protein 7.2 6.5 - 8.1 g/dL   Albumin 4.0 3.5 - 5.0 g/dL   AST 19 15 - 41 U/L   ALT 12 (L) 14 - 54 U/L   Alkaline Phosphatase 129 (H) 38 - 126 U/L   Total Bilirubin 1.6 (H) 0.3 - 1.2 mg/dL   GFR calc non Af Amer 33 (L) >60 mL/min   GFR calc Af Amer 38 (L) >60 mL/min    Comment: (NOTE) The eGFR has been calculated using the CKD EPI equation. This calculation has not been validated in all clinical situations. eGFR's persistently <60 mL/min signify possible Chronic Kidney Disease.    Anion gap 24 (H) 5 - 15  Magnesium     Status: None   Collection Time: 02/14/17  3:00 PM  Result Value Ref Range   Magnesium 2.0 1.7 - 2.4 mg/dL  I-stat troponin, ED     Status: None   Collection Time: 02/14/17  3:06 PM  Result Value Ref Range   Troponin i, poc 0.02 0.00 - 0.08 ng/mL   Comment 3            Comment: Due to the release kinetics of cTnI, a negative result within the first hours of the onset of symptoms does not rule out myocardial  infarction with certainty. If myocardial infarction is still suspected, repeat the test at appropriate intervals.   I-Stat Chem 8, ED     Status: Abnormal   Collection Time: 02/14/17  3:07 PM  Result Value Ref Range   Sodium 144 135 - 145 mmol/L   Potassium 3.2 (L) 3.5 - 5.1 mmol/L   Chloride 98 (L) 101 - 111 mmol/L   BUN 26 (H) 6 - 20 mg/dL   Creatinine, Ser 1.10 (H) 0.44 - 1.00 mg/dL   Glucose, Bld 91 65 - 99 mg/dL   Calcium, Ion 1.03 (L) 1.15 - 1.40 mmol/L   TCO2 28 22 - 32 mmol/L   Hemoglobin 17.0 (H) 12.0 - 15.0 g/dL   HCT 50.0 (H) 36.0 - 46.0 %  Troponin I (q 6hr x 3)     Status: None   Collection Time: 02/14/17  9:08 PM  Result Value Ref Range   Troponin I <0.03 <0.03 ng/mL  Antithrombin III     Status: None   Collection Time: 02/14/17  9:08 PM  Result Value Ref Range   AntiThromb III Func 91 75 - 120 %  MRSA PCR Screening     Status: None   Collection Time: 02/15/17  2:14 AM  Result Value Ref Range   MRSA by PCR NEGATIVE NEGATIVE    Comment:        The GeneXpert MRSA Assay (FDA approved for NASAL specimens only), is one component of a comprehensive MRSA colonization surveillance program. It is not intended to diagnose MRSA  infection nor to guide or monitor treatment for MRSA infections.   Troponin I (q 6hr x 3)     Status: None   Collection Time: 02/15/17  2:31 AM  Result Value Ref Range   Troponin I <0.03 <0.03 ng/mL  TSH     Status: None   Collection Time: 02/15/17  2:31 AM  Result Value Ref Range   TSH 1.836 0.350 - 4.500 uIU/mL    Comment: Performed by a 3rd Generation assay with a functional sensitivity of <=0.01 uIU/mL.  Hemoglobin A1c     Status: Abnormal   Collection Time: 02/15/17  2:31 AM  Result Value Ref Range   Hgb A1c MFr Bld 6.3 (H) 4.8 - 5.6 %    Comment: (NOTE) Pre diabetes:          5.7%-6.4% Diabetes:              >6.4% Glycemic control for   <7.0% adults with diabetes    Mean Plasma Glucose 134.11 mg/dL  Lipid panel     Status:  None   Collection Time: 02/15/17  2:31 AM  Result Value Ref Range   Cholesterol 157 0 - 200 mg/dL   Triglycerides 103 <150 mg/dL   HDL 45 >40 mg/dL   Total CHOL/HDL Ratio 3.5 RATIO   VLDL 21 0 - 40 mg/dL   LDL Cholesterol 91 0 - 99 mg/dL    Comment:        Total Cholesterol/HDL:CHD Risk Coronary Heart Disease Risk Table                     Men   Women  1/2 Average Risk   3.4   3.3  Average Risk       5.0   4.4  2 X Average Risk   9.6   7.1  3 X Average Risk  23.4   11.0        Use the calculated Patient Ratio above and the CHD Risk Table to determine the patient's CHD Risk.        ATP III CLASSIFICATION (LDL):  <100     mg/dL   Optimal  100-129  mg/dL   Near or Above                    Optimal  130-159  mg/dL   Borderline  160-189  mg/dL   High  >190     mg/dL   Very High   Troponin I (q 6hr x 3)     Status: None   Collection Time: 02/15/17  7:16 AM  Result Value Ref Range   Troponin I <0.03 <0.03 ng/mL  Vitamin B12     Status: Abnormal   Collection Time: 02/15/17  7:16 AM  Result Value Ref Range   Vitamin B-12 1,347 (H) 180 - 914 pg/mL    Comment: (NOTE) This assay is not validated for testing neonatal or myeloproliferative syndrome specimens for Vitamin B12 levels.   RPR     Status: None   Collection Time: 02/15/17  7:16 AM  Result Value Ref Range   RPR Ser Ql Non Reactive Non Reactive    Comment: (NOTE) Performed At: [facility] [place], [place] [id number] Rush Farmer MD [id number]   Magnesium     Status: None   Collection Time: 02/15/17  7:16 AM  Result Value Ref Range   Magnesium 2.0 1.7 - 2.4 mg/dL  Comprehensive metabolic panel  Status: Abnormal   Collection Time: 02/15/17  7:16 AM  Result Value Ref Range   Sodium 141 135 - 145 mmol/L   Potassium 2.9 (L) 3.5 - 5.1 mmol/L   Chloride 99 (L) 101 - 111 mmol/L   CO2 20 (L) 22 - 32 mmol/L   Glucose, Bld 86 65 - 99 mg/dL   BUN 13 6 - 20 mg/dL   Creatinine, Ser 1.18 (H)  0.44 - 1.00 mg/dL   Calcium 8.8 (L) 8.9 - 10.3 mg/dL   Total Protein 6.4 (L) 6.5 - 8.1 g/dL   Albumin 3.5 3.5 - 5.0 g/dL   AST 15 15 - 41 U/L   ALT 11 (L) 14 - 54 U/L   Alkaline Phosphatase 106 38 - 126 U/L   Total Bilirubin 1.2 0.3 - 1.2 mg/dL   GFR calc non Af Amer 42 (L) >60 mL/min   GFR calc Af Amer 49 (L) >60 mL/min    Comment: (NOTE) The eGFR has been calculated using the CKD EPI equation. This calculation has not been validated in all clinical situations. eGFR's persistently <60 mL/min signify possible Chronic Kidney Disease.    Anion gap 22 (H) 5 - 15    Comment: REPEATED TO VERIFY  CBC     Status: Abnormal   Collection Time: 02/15/17  7:16 AM  Result Value Ref Range   WBC 6.7 4.0 - 10.5 K/uL   RBC 4.54 3.87 - 5.11 MIL/uL   Hemoglobin 13.5 12.0 - 15.0 g/dL    Comment: DELTA CHECK NOTED REPEATED TO VERIFY    HCT 43.9 36.0 - 46.0 %   MCV 96.7 78.0 - 100.0 fL   MCH 29.7 26.0 - 34.0 pg   MCHC 30.8 30.0 - 36.0 g/dL   RDW 13.3 11.5 - 15.5 %   Platelets 120 (L) 150 - 400 K/uL  Renal function panel     Status: Abnormal   Collection Time: 02/16/17  3:25 AM  Result Value Ref Range   Sodium 140 135 - 145 mmol/L   Potassium 3.8 3.5 - 5.1 mmol/L    Comment: DELTA CHECK NOTED   Chloride 98 (L) 101 - 111 mmol/L   CO2 25 22 - 32 mmol/L   Glucose, Bld 117 (H) 65 - 99 mg/dL   BUN 16 6 - 20 mg/dL   Creatinine, Ser 1.31 (H) 0.44 - 1.00 mg/dL   Calcium 9.4 8.9 - 10.3 mg/dL   Phosphorus 1.9 (L) 2.5 - 4.6 mg/dL   Albumin 3.5 3.5 - 5.0 g/dL   GFR calc non Af Amer 37 (L) >60 mL/min   GFR calc Af Amer 43 (L) >60 mL/min    Comment: (NOTE) The eGFR has been calculated using the CKD EPI equation. This calculation has not been validated in all clinical situations. eGFR's persistently <60 mL/min signify possible Chronic Kidney Disease.    Anion gap 17 (H) 5 - 15    Current Facility-Administered Medications  Medication Dose Route Frequency Provider Last Rate Last Dose  .  acetaminophen (TYLENOL) tablet 650 mg  650 mg Oral Q4H PRN Jani Gravel, MD   650 mg at 02/15/17 2359   Or  . acetaminophen (TYLENOL) solution 650 mg  650 mg Per Tube Q4H PRN Jani Gravel, MD       Or  . acetaminophen (TYLENOL) suppository 650 mg  650 mg Rectal Q4H PRN Jani Gravel, MD      . aspirin suppository 300 mg  300 mg Rectal Daily Jani Gravel, MD  Or  . aspirin tablet 325 mg  325 mg Oral Daily Jani Gravel, MD   325 mg at 02/16/17 0845  . atorvastatin (LIPITOR) tablet 40 mg  40 mg Oral q1800 Rosalin Hawking, MD   40 mg at 02/15/17 1821  . enoxaparin (LOVENOX) injection 40 mg  40 mg Subcutaneous Q24H Rosalin Hawking, MD   40 mg at 02/15/17 1821  . metoprolol tartrate (LOPRESSOR) tablet 25 mg  25 mg Oral BID Sueanne Margarita, MD        Musculoskeletal: Strength & Muscle Tone: not tested Gait & Station: not tested Patient leans: N/A  Psychiatric Specialty Exam: Physical Exam  Psychiatric: Judgment and thought content normal. Her affect is blunt. Her speech is delayed. She is slowed and withdrawn. Cognition and memory are normal. She exhibits a depressed mood.    Review of Systems  Constitutional: Positive for malaise/fatigue.  HENT: Positive for hearing loss.   Eyes: Negative.   Respiratory: Negative.   Cardiovascular: Negative.   Gastrointestinal: Negative.   Skin: Negative.   Psychiatric/Behavioral: Positive for depression.    Blood pressure 123/75, pulse 71, temperature 97.7 F (36.5 C), temperature source Oral, resp. rate 17, height 5' 7"  (1.702 m), weight 68.1 kg (150 lb 2.1 oz), SpO2 96 %.Body mass index is 23.51 kg/m.  General Appearance: Casual  Eye Contact:  Minimal  Speech:  Slow  Volume:  Decreased  Mood:  Depressed and Dysphoric  Affect:  Constricted  Thought Process:  Disorganized  Orientation:  Other:  only to person and place  Thought Content:  Logical  Suicidal Thoughts:  No  Homicidal Thoughts:  No  Memory:  Immediate;   Fair Recent;   Poor Remote;   Fair   Judgement:  Impaired  Insight:  Shallow  Psychomotor Activity:  Psychomotor Retardation  Concentration:  Concentration: Fair and Attention Span: Fair  Recall:  [employer] of Knowledge:  Fair  Language:  Fair  Akathisia:  No  Handed:  Right  AIMS (if indicated):     Assets:  Communication Skills Desire for Improvement Social Support  ADL's:  marginal  Cognition:  marginal  Sleep:   poor     Treatment Plan Summary: Plan/recomendations: -Start Mirtazapine sol tab 15 mg at bedtime for depression/sleep/appetite stimulation -Re-consult psych service if needed in future  Disposition: No evidence of imminent risk to self or others at present.   Patient does not meet criteria for psychiatric inpatient admission. Supportive therapy provided about ongoing stressors.  Corena Pilgrim, MD 02/16/2017 2:20 PM

## 2017-02-16 NOTE — Progress Notes (Addendum)
STROKE TEAM PROGRESS NOTE   SUBJECTIVE (INTERVAL HISTORY) No family is at the bedside.  Pt still has aphasia. Cardiology on board. Off IV cardizem, on home metoprolol. HR controlled. Pt so far eating meals and take meds well in hospital.    OBJECTIVE Temp:  [97.5 F (36.4 C)-97.9 F (36.6 C)] 97.5 F (36.4 C) (01/27 0335) Pulse Rate:  [78-79] 78 (01/26 2352) Cardiac Rhythm: Atrial fibrillation (01/26 2352) BP: (119-150)/(68-74) 123/68 (01/27 0335) SpO2:  [97 %] 97 % (01/26 2352)  CBC:  Recent Labs  Lab 02/14/17 1500 02/14/17 1507 02/15/17 0716  WBC 7.7  --  6.7  NEUTROABS 5.5  --   --   HGB 15.4* 17.0* 13.5  HCT 49.9* 50.0* 43.9  MCV 98.2  --  96.7  PLT 136*  --  120*    Basic Metabolic Panel:  Recent Labs  Lab 02/14/17 1500  02/15/17 0716 02/16/17 0325  NA 145   < > 141 140  K 3.3*   < > 2.9* 3.8  CL 99*   < > 99* 98*  CO2 22  --  20* 25  GLUCOSE 92   < > 86 117*  BUN 22*   < > 13 16  CREATININE 1.45*   < > 1.18* 1.31*  CALCIUM 9.2  --  8.8* 9.4  MG 2.0  --  2.0  --   PHOS  --   --   --  1.9*   < > = values in this interval not displayed.    Lipid Panel:     Component Value Date/Time   CHOL 157 02/15/2017 0231   TRIG 103 02/15/2017 0231   HDL 45 02/15/2017 0231   CHOLHDL 3.5 02/15/2017 0231   VLDL 21 02/15/2017 0231   LDLCALC 91 02/15/2017 0231   HgbA1c:  Lab Results  Component Value Date   HGBA1C 6.3 (H) 02/15/2017   Urine Drug Screen:     Component Value Date/Time   LABOPIA NONE DETECTED 04/04/2015 0215   COCAINSCRNUR NONE DETECTED 04/04/2015 0215   LABBENZ NONE DETECTED 04/04/2015 0215   AMPHETMU NONE DETECTED 04/04/2015 0215   THCU NONE DETECTED 04/04/2015 0215   LABBARB NONE DETECTED 04/04/2015 0215    Alcohol Level     Component Value Date/Time   ETH <5 04/04/2015 0452    IMAGING I have personally reviewed the radiological images below and agree with the radiology interpretations.  Ct Angio Head W Or Wo Contrast Ct Angio Neck  W Or Wo Contrast Ct Cerebral Perfusion W Contrast 02/14/2017  IMPRESSION:  1. No emergent large vessel occlusion.  No infarct by CT perfusion.  2. Overall mild atherosclerosis in the head neck. No correctable flow limiting stenosis.  3. Small area of delayed perfusion in the lateral left frontal lobe, possibly reflecting a small embolism not resolved by CTA.  4. Bilateral inferior cerebellar infarcts with a chronic appearance. The left-sided infarct and bilateral poor pica flow has developed since 04/04/2015.   Ct Head Code Stroke Wo Contrast 02/14/2017 IMPRESSION:  1. Left posterior inferior cerebellar nonhemorrhagic infarct is new since the prior study and may be acute.  2. Remote right cerebellar infarct.  3. No acute supratentorial infarct visible.  4. Advanced atrophy and white matter disease is stable.  5. ASPECTS is 10/10   Transthoracic Echocardiogram - Left ventricle: The cavity size was normal. Systolic function was   normal. The estimated ejection fraction was in the range of 55%   to 60%. Wall motion  was normal; there were no regional wall   motion abnormalities. - Mitral valve: There was mild regurgitation. - Left atrium: The atrium was mildly dilated. - Atrial septum: There was increased thickness of the septum,   consistent with lipomatous hypertrophy. - Tricuspid valve: There was mild-moderate regurgitation. - Pulmonic valve: There was trivial regurgitation. - Pulmonary arteries: PA peak pressure: 44 mm Hg (S). - Pericardium, extracardiac: A trivial pericardial effusion was   identified posterior to the heart. Impressions: - The right ventricular systolic pressure was increased consistent   with moderate pulmonary hypertension.  MRI  1. 2.6 cm acute infarct in the lateral left frontal lobe. This correlates with the area of delayed perfusion on CTP yesterday. 2. Large nonacute infarcts of the bilateral inferior cerebellum, progressed on the left since 2017  comparison.   PHYSICAL EXAM Vitals:   02/15/17 1938 02/15/17 2145 02/15/17 2352 02/16/17 0335  BP:  119/70 (!) 150/74 123/68  Pulse:  79 78   Resp:      Temp: 97.8 F (36.6 C)  97.9 F (36.6 C) (!) 97.5 F (36.4 C)  TempSrc: Oral  Oral Oral  SpO2:   97%   Weight:      Height:        Temp:  [97.5 F (36.4 C)-97.9 F (36.6 C)] 97.5 F (36.4 C) (01/27 0335) Pulse Rate:  [78-79] 78 (01/26 2352) BP: (119-150)/(68-74) 123/68 (01/27 0335) SpO2:  [97 %] 97 % (01/26 2352)  General - Well nourished, well developed, in no apparent distress, depressed mood.  Ophthalmologic - Fundi not visualized due to noncooperation.  Cardiovascular - irregularly irregular heart rate and rhythm.  Mental Status -  Awake alert, orientated to self, but not able to answer other questions due to aphasia. Able to articulate some sounds but not make sense, followed limited simple commands, such as open mouth, stick out tongue, showing two fingers, however, not able to close eyes, show thumb or fist on command.   Cranial Nerves II - XII - II - Visual field intact OU, blinking to visual threat bilaterally. III, IV, VI - Extraocular movements intact. V - Facial sensation intact bilaterally. VII - Facial movement intact bilaterally. VIII - Hearing & vestibular intact bilaterally. X - Palate elevates symmetrically. XI - Chin turning & shoulder shrug intact bilaterally. XII - Tongue protrusion intact.  Motor Strength - The patient's strength was symmetrical in all extremities and pronator drift was absent.  Bulk was normal and fasciculations were absent.   Motor Tone - Muscle tone was assessed at the neck and appendages and was normal.  Reflexes - The patient's reflexes were 1+ in all extremities and she had no pathological reflexes.  Sensation, coordination not cooperative, and gait not tested.    ASSESSMENT/PLAN [name] is a 84 y.o. female with history of atrial fibrillation on Xarelto  but not compliant, breast cancer, chronic kidney disease, GI bleed history, diabetes mellitus, hypertension, and previous stroke, presenting with aphasia and right facial droop. She did not receive IV t-PA due to possible Xarelto use.  Stroke: Left MCA Broca area infarct likely embolic secondary to atrial fibrillation not on anticoagulation.  Resultant global aphasia  CT head - Lt PICA infarct and remote Rt cerebellar infarct.   MRI head -  2.6 cm acute infarct in the lateral left frontal lobe.  Chronic left and right PICA cerebellar infarcts.  CTA H&N - No emergent large vessel occlusion.  No infarct by CT perfusion.   2D Echo -  EF 55-60%  LDL - 91  HgbA1c - 6.3  VTE prophylaxis - Lovenox  Fall precautions Diet Heart Room service appropriate? Yes; Fluid consistency: Thin  Not taking Xarelto (rivaroxaban) prior to admission, now on aspirin 325 mg daily.  Plan to resume Xarelto tomorrow, after psychiatry consulted for severe depression. ASA and lovenox can be discontinued after Xarelto started.   Patient counseled to be compliant with her antithrombotic medications  Ongoing aggressive stroke risk factor management  Therapy recommendations:  CIR  Disposition:  Pending  Severe depression with behavior changes  Happened 2 months ago  Patient behavior changes at home, paranoid, hallucinations  Refused to take any medication at home - likely needs home supervision for meds  Has not been seen in psychiatry  Recommend psychiatry consultation - may consider ECT for severe depression  PAF  Diagnosed in 12/2016 - put on Xarelto  Patient not taking at home  Currently rate controlled  Off Cardizem IV - now on po metoprolol  Cardiology on board  History of stroke  03/2015, right PICA infarct, MRA head and CT neck showed right PICA high-grade stenosis, EF 60-65%, LDL 136 and A1c 6.7 -put on DAPT - recommend 30-day cardio event monitor which was not done  This admission, CT  showed interval left PICA infarct  Hypertension  Stable  Long-term BP goal normotensive  Hyperlipidemia  Home meds:  Lipitor 40 mg daily not taking at home  LDL 91, goal < 70  Now on Lipitor 40 mg daily  Continue statin at discharge  Other Stroke Risk Factors  Advanced age  Other Active Problems  Hypokalemia - 3.2->2.9->3.8 - supplemented  Mild thrombocytopenia - 120K  Hospital day # 2  Neurology will sign off. Please call with questions. Pt will follow up with Dr. Jannifer Franklin at [facility] in about 6 weeks. Thanks for the consult.   Rosalin Hawking, MD PhD Stroke Neurology 02/16/2017 12:51 PM   To contact Stroke Continuity provider, please refer to [email or web address] After hours, contact General Neurology

## 2017-02-16 NOTE — Progress Notes (Signed)
Progress Note  Patient Name: LAPORCHIA NAKAJIMA Date of Encounter: 02/16/2017  Primary Cardiologist: No primary care provider on file.   Subjective   HR dropped transiently into the 50's and now Cardizem gtt off.  Started on PO Cardizem  Inpatient Medications    Scheduled Meds: . aspirin  300 mg Rectal Daily   Or  . aspirin  325 mg Oral Daily  . atorvastatin  40 mg Oral q1800  . diltiazem  30 mg Oral Q6H  . enoxaparin (LOVENOX) injection  40 mg Subcutaneous Q24H  . metoprolol tartrate  12.5 mg Oral BID   Continuous Infusions:  PRN Meds: acetaminophen **OR** acetaminophen (TYLENOL) oral liquid 160 mg/5 mL **OR** acetaminophen   Vital Signs    Vitals:   02/16/17 0748 02/16/17 0900 02/16/17 1100 02/16/17 1131  BP:  (!) 130/91 107/70   Pulse:  (!) 108 81 76  Resp:  17 19 19   Temp: 97.6 F (36.4 C)   97.7 F (36.5 C)  TempSrc: Oral   Oral  SpO2:  93% 97% 96%  Weight:      Height:        Intake/Output Summary (Last 24 hours) at 02/16/2017 1203 Last data filed at 02/16/2017 0940 Gross per 24 hour  Intake 420 ml  Output 2 ml  Net 418 ml   Filed Weights   02/15/17 0052  Weight: 150 lb 2.1 oz (68.1 kg)    Telemetry    Atrial fibrillation with CVR - Personally Reviewed  ECG    No new EKG to review - Personally Reviewed  Physical Exam   GEN: No acute distress.   Neck: No JVD Cardiac: irregularly irregular, no murmurs, rubs, or gallops.  Respiratory: Clear to auscultation bilaterally. GI: Soft, nontender, non-distended  MS: No edema; No deformity. Neuro:  Nonfocal  Psych: Normal affect   Labs    Chemistry Recent Labs  Lab 02/14/17 1500 02/14/17 1507 02/15/17 0716 02/16/17 0325  NA 145 144 141 140  K 3.3* 3.2* 2.9* 3.8  CL 99* 98* 99* 98*  CO2 22  --  20* 25  GLUCOSE 92 91 86 117*  BUN 22* 26* 13 16  CREATININE 1.45* 1.10* 1.18* 1.31*  CALCIUM 9.2  --  8.8* 9.4  PROT 7.2  --  6.4*  --   ALBUMIN 4.0  --  3.5 3.5  AST 19  --  15  --   ALT  12*  --  11*  --   ALKPHOS 129*  --  106  --   BILITOT 1.6*  --  1.2  --   GFRNONAA 33*  --  42* 37*  GFRAA 38*  --  49* 43*  ANIONGAP 24*  --  22* 17*     Hematology Recent Labs  Lab 02/14/17 1500 02/14/17 1507 02/15/17 0716  WBC 7.7  --  6.7  RBC 5.08  --  4.54  HGB 15.4* 17.0* 13.5  HCT 49.9* 50.0* 43.9  MCV 98.2  --  96.7  MCH 30.3  --  29.7  MCHC 30.9  --  30.8  RDW 13.5  --  13.3  PLT 136*  --  120*    Cardiac Enzymes Recent Labs  Lab 02/14/17 2108 02/15/17 0231 02/15/17 0716  TROPONINI <0.03 <0.03 <0.03    Recent Labs  Lab 02/14/17 1506  TROPIPOC 0.02     BNPNo results for input(s): BNP, PROBNP in the last 168 hours.   DDimer No results for input(s): DDIMER  in the last 168 hours.   Radiology    Ct Angio Head W Or Wo Contrast  Result Date: 02/14/2017 CLINICAL DATA:  Aphasia. EXAM: CT ANGIOGRAPHY HEAD AND NECK CT PERFUSION BRAIN TECHNIQUE: Multidetector CT imaging of the head and neck was performed using the standard protocol during bolus administration of intravenous contrast. Multiplanar CT image reconstructions and MIPs were obtained to evaluate the vascular anatomy. Carotid stenosis measurements (when applicable) are obtained utilizing NASCET criteria, using the distal internal carotid diameter as the denominator. Multiphase CT imaging of the brain was performed following IV bolus contrast injection. Subsequent parametric perfusion maps were calculated using RAPID software. CONTRAST:  5mL ISOVUE-370 IOPAMIDOL (ISOVUE-370) INJECTION 76% COMPARISON:  Head CT from earlier today. Brain MRI and MRA 04/04/2015 FINDINGS: CTA NECK FINDINGS Aortic arch: Atherosclerotic plaque. No acute finding. Three vessel branching. Right carotid system: Mild atheromatous changes at the ICA bulb. No stenosis, ulceration, or dissection. Left carotid system: Mild atheromatous changes at the common carotid bifurcation. No stenosis, ulceration, or dissection. Vertebral arteries: There is  no proximal subclavian flow limiting stenosis. Mild right vertebral artery dominance. Both vertebral arteries are smooth and widely patent to the dura. Skeleton: No acute or aggressive finding. Other neck: No evidence of mass or inflammation. Upper chest: Negative Review of the MIP images confirms the above findings CTA HEAD FINDINGS Anterior circulation: Atherosclerotic calcification on the carotid siphons. No stenosis or branch occlusion. Negative for aneurysm. Posterior circulation: Right vertebral artery dominance. The vertebrobasilar arteries are smooth and widely patent. Robust and symmetric bilateral PCA flow. Bilateral diminutive picas, especially proximally, a notable change since 2017 MRA. Venous sinuses: Patent Anatomic variants: None significant Delayed phase: Not performed in the emergent setting Review of the MIP images confirms the above findings CT Brain Perfusion Findings: CBF (<30%) Volume: 61mL Perfusion (Tmax>6.0s) volume: 15mL Head T-max threshold of greater than 4 seconds there is an area of delayed flow in the lateral left frontal region. IMPRESSION: 1. No emergent large vessel occlusion.  No infarct by CT perfusion. 2. Overall mild atherosclerosis in the head neck. No correctable flow limiting stenosis. 3. Small area of delayed perfusion in the lateral left frontal lobe, possibly reflecting a small embolism not resolved by CTA. 4. Bilateral inferior cerebellar infarcts with a chronic appearance. The left-sided infarct and bilateral poor pica flow has developed since 04/04/2015. Electronically Signed   By: Monte Fantasia M.D.   On: 02/14/2017 16:00   Dg Chest 2 View  Result Date: 02/14/2017 CLINICAL DATA:  Atrial fibrillation and diabetes. Evaluate for pneumonia. Nonsmoker. EXAM: CHEST  2 VIEW COMPARISON:  01/03/2017 FINDINGS: Cardiomegaly with aortic atherosclerosis. Mild pulmonary hyperinflation. No pulmonary consolidations nor overt pulmonary edema. There is minimal atelectasis at the  left lung base. Clearing of previous pleural effusions. Axillary clips are seen on the left. No acute nor suspicious osseous abnormalities. IMPRESSION: 1. The lungs are mildly hyperinflated. No alveolar consolidations. Left basilar atelectasis is noted. 2. Aortic atherosclerosis. 3. Stable cardiomegaly. Electronically Signed   By: Ashley Royalty M.D.   On: 02/14/2017 16:44   Ct Angio Neck W Or Wo Contrast  Result Date: 02/14/2017 CLINICAL DATA:  Aphasia. EXAM: CT ANGIOGRAPHY HEAD AND NECK CT PERFUSION BRAIN TECHNIQUE: Multidetector CT imaging of the head and neck was performed using the standard protocol during bolus administration of intravenous contrast. Multiplanar CT image reconstructions and MIPs were obtained to evaluate the vascular anatomy. Carotid stenosis measurements (when applicable) are obtained utilizing NASCET criteria, using the distal internal carotid diameter  as the denominator. Multiphase CT imaging of the brain was performed following IV bolus contrast injection. Subsequent parametric perfusion maps were calculated using RAPID software. CONTRAST:  81mL ISOVUE-370 IOPAMIDOL (ISOVUE-370) INJECTION 76% COMPARISON:  Head CT from earlier today. Brain MRI and MRA 04/04/2015 FINDINGS: CTA NECK FINDINGS Aortic arch: Atherosclerotic plaque. No acute finding. Three vessel branching. Right carotid system: Mild atheromatous changes at the ICA bulb. No stenosis, ulceration, or dissection. Left carotid system: Mild atheromatous changes at the common carotid bifurcation. No stenosis, ulceration, or dissection. Vertebral arteries: There is no proximal subclavian flow limiting stenosis. Mild right vertebral artery dominance. Both vertebral arteries are smooth and widely patent to the dura. Skeleton: No acute or aggressive finding. Other neck: No evidence of mass or inflammation. Upper chest: Negative Review of the MIP images confirms the above findings CTA HEAD FINDINGS Anterior circulation: Atherosclerotic  calcification on the carotid siphons. No stenosis or branch occlusion. Negative for aneurysm. Posterior circulation: Right vertebral artery dominance. The vertebrobasilar arteries are smooth and widely patent. Robust and symmetric bilateral PCA flow. Bilateral diminutive picas, especially proximally, a notable change since 2017 MRA. Venous sinuses: Patent Anatomic variants: None significant Delayed phase: Not performed in the emergent setting Review of the MIP images confirms the above findings CT Brain Perfusion Findings: CBF (<30%) Volume: 24mL Perfusion (Tmax>6.0s) volume: 3mL Head T-max threshold of greater than 4 seconds there is an area of delayed flow in the lateral left frontal region. IMPRESSION: 1. No emergent large vessel occlusion.  No infarct by CT perfusion. 2. Overall mild atherosclerosis in the head neck. No correctable flow limiting stenosis. 3. Small area of delayed perfusion in the lateral left frontal lobe, possibly reflecting a small embolism not resolved by CTA. 4. Bilateral inferior cerebellar infarcts with a chronic appearance. The left-sided infarct and bilateral poor pica flow has developed since 04/04/2015. Electronically Signed   By: Monte Fantasia M.D.   On: 02/14/2017 16:00   Mr Brain Wo Contrast  Result Date: 02/15/2017 CLINICAL DATA:  Altered level of consciousness.  Receptive aphasia. EXAM: MRI HEAD WITHOUT CONTRAST TECHNIQUE: Multiplanar, multiecho pulse sequences of the brain and surrounding structures were obtained without intravenous contrast. COMPARISON:  CTA and cerebral perfusion from yesterday FINDINGS: Brain: In the area of previously noted delayed perfusion is a lateral left frontal infarct measuring up to 2.6 cm, cortical and white matter. No acute infarct in alternate distribution. No hemorrhagic conversion. Extensive bilateral inferior cerebellar infarction that has progressed on the left when compared to 04/04/2015 brain MRI. There is T1 hyperintensity within the  left cerebellar infarct distribution consistent with cortical laminar necrosis or nonacute blood products. Moderate chronic small vessel ischemic type change in the cerebral white matter. No hydrocephalus or masslike finding. Vascular: Major flow voids are preserved. Skull and upper cervical spine: Negative for marrow lesion Sinuses/Orbits: Right cataract resection. Small left maxillary mucous retention cysts. Mild bilateral mastoid opacification. IMPRESSION: 1. 2.6 cm acute infarct in the lateral left frontal lobe. This correlates with the area of delayed perfusion on CTP yesterday. 2. Large nonacute infarcts of the bilateral inferior cerebellum, progressed on the left since 2017 comparison. Electronically Signed   By: Monte Fantasia M.D.   On: 02/15/2017 13:05   Ct Cerebral Perfusion W Contrast  Result Date: 02/14/2017 CLINICAL DATA:  Aphasia. EXAM: CT ANGIOGRAPHY HEAD AND NECK CT PERFUSION BRAIN TECHNIQUE: Multidetector CT imaging of the head and neck was performed using the standard protocol during bolus administration of intravenous contrast. Multiplanar CT image reconstructions  and MIPs were obtained to evaluate the vascular anatomy. Carotid stenosis measurements (when applicable) are obtained utilizing NASCET criteria, using the distal internal carotid diameter as the denominator. Multiphase CT imaging of the brain was performed following IV bolus contrast injection. Subsequent parametric perfusion maps were calculated using RAPID software. CONTRAST:  87mL ISOVUE-370 IOPAMIDOL (ISOVUE-370) INJECTION 76% COMPARISON:  Head CT from earlier today. Brain MRI and MRA 04/04/2015 FINDINGS: CTA NECK FINDINGS Aortic arch: Atherosclerotic plaque. No acute finding. Three vessel branching. Right carotid system: Mild atheromatous changes at the ICA bulb. No stenosis, ulceration, or dissection. Left carotid system: Mild atheromatous changes at the common carotid bifurcation. No stenosis, ulceration, or dissection.  Vertebral arteries: There is no proximal subclavian flow limiting stenosis. Mild right vertebral artery dominance. Both vertebral arteries are smooth and widely patent to the dura. Skeleton: No acute or aggressive finding. Other neck: No evidence of mass or inflammation. Upper chest: Negative Review of the MIP images confirms the above findings CTA HEAD FINDINGS Anterior circulation: Atherosclerotic calcification on the carotid siphons. No stenosis or branch occlusion. Negative for aneurysm. Posterior circulation: Right vertebral artery dominance. The vertebrobasilar arteries are smooth and widely patent. Robust and symmetric bilateral PCA flow. Bilateral diminutive picas, especially proximally, a notable change since 2017 MRA. Venous sinuses: Patent Anatomic variants: None significant Delayed phase: Not performed in the emergent setting Review of the MIP images confirms the above findings CT Brain Perfusion Findings: CBF (<30%) Volume: 81mL Perfusion (Tmax>6.0s) volume: 32mL Head T-max threshold of greater than 4 seconds there is an area of delayed flow in the lateral left frontal region. IMPRESSION: 1. No emergent large vessel occlusion.  No infarct by CT perfusion. 2. Overall mild atherosclerosis in the head neck. No correctable flow limiting stenosis. 3. Small area of delayed perfusion in the lateral left frontal lobe, possibly reflecting a small embolism not resolved by CTA. 4. Bilateral inferior cerebellar infarcts with a chronic appearance. The left-sided infarct and bilateral poor pica flow has developed since 04/04/2015. Electronically Signed   By: Monte Fantasia M.D.   On: 02/14/2017 16:00   Ct Head Code Stroke Wo Contrast  Result Date: 02/14/2017 CLINICAL DATA:  Code stroke.  Right facial droop. EXAM: CT HEAD WITHOUT CONTRAST TECHNIQUE: Contiguous axial images were obtained from the base of the skull through the vertex without intravenous contrast. COMPARISON:  MRI brain 04/03/2016 FINDINGS: Brain:  Extensive periventricular and subcortical white matter changes are similar to the prior exam. Posterior inferior left cerebellar hypoattenuation is new since the prior exam. No other acute cortical infarct is evident. A remote right posterior inferior cerebellar infarct is stable. The insular ribbon is intact. Basal ganglia are stable. The ventricles are proportionate to the degree of atrophy. There is no significant extra-axial fluid collection. Vascular: There is mildly increased attenuation throughout the vascular system. No asymmetric hyperdense vessel is present. Vascular calcifications are present within the cavernous internal carotid arteries bilaterally. Skull: Calvarium is intact. No focal lytic or blastic lesions are present. Sinuses/Orbits: Polyp or mucous retention cyst is noted anteriorly in the left maxillary sinus. The remaining paranasal sinuses and mastoid air cells are clear. A right lens replacement is present. Globes and orbits are otherwise within normal limits. ASPECTS [facility] Stroke Program Early CT Score) - Ganglionic level infarction (caudate, lentiform nuclei, internal capsule, insula, M1-M3 cortex): 7/7 - Supraganglionic infarction (M4-M6 cortex): 3/3 Total score (0-10 with 10 being normal): 10/10 IMPRESSION: 1. Left posterior inferior cerebellar nonhemorrhagic infarct is new since the prior study and  may be acute. 2. Remote right cerebellar infarct. 3. No acute supratentorial infarct visible. 4. Advanced atrophy and white matter disease is stable. 5. ASPECTS is 10/10 Electronically Signed   By: San Morelle M.D.   On: 02/14/2017 15:22    Cardiac Studies   2D echo 02/15/2017 Study Conclusions  - Left ventricle: The cavity size was normal. Systolic function was   normal. The estimated ejection fraction was in the range of 55%   to 60%. Wall motion was normal; there were no regional wall   motion abnormalities. - Mitral valve: There was mild regurgitation. - Left  atrium: The atrium was mildly dilated. - Atrial septum: There was increased thickness of the septum,   consistent with lipomatous hypertrophy. - Tricuspid valve: There was mild-moderate regurgitation. - Pulmonic valve: There was trivial regurgitation. - Pulmonary arteries: PA peak pressure: 44 mm Hg (S). - Pericardium, extracardiac: A trivial pericardial effusion was   identified posterior to the heart.  Impressions:  - The right ventricular systolic pressure was increased consistent   with moderate pulmonary hypertension.  Patient Profile     84 y.o. female  with a history of breast CA, Pagets disease, MGUS, DM2, CKD stage 3 and paroxysmal atrial fibrillation.  She presented to ER with complaints of gait instability, difficulty with speech and expressive aphasia and was seen by her PCP and sent to ER where code stroke was activated.  There was some question as to when her last Xarelto dose was so TPA was not used.  According to family members she had not taken her Xarelto in 3 days.  CT showed left posterior inferior cerebellar nonhemorrhagic CVA that was new and old right cerebellar infarct.  She was noted to be in afib with RVR.  Her Xarelto was stopped and she was started on ASSA 325mg  daily as well as high dose statin.  Assessment & Plan    1. Persistent Atrial fibrillation with RVR - she has a history of persistent atrial fibrillation in the past and has been on Xarelto but apparently noncompliant.  Her family says that she has not taken it in over 3 days PTA.  Likely etiology of acute CVA.  She has been taken off her Xarelto and placed on ASA 325mg  daily.   - as she remains in atrial fibrillation - per neuro they plan to restart Xarelto tomorrow - 2D echo with normal LVF - TSH normal - increase Lopressor to 25mg  BID  and stop PO Cardizem that was started earlier today.  She has not received any doses.  HR currently in upper 60's.  2.  HTN - BP controlled currently on BB    For  questions or updates, please contact Bonnie Please consult [email or web address] for contact info under Cardiology/STEMI.      Signed, Fransico Him, MD  02/16/2017, 12:03 PM

## 2017-02-16 NOTE — Progress Notes (Signed)
2145 Pt had increased confused and consistently climbing out of the bed. Pt was cleaned and bathed and teeth cleaned. Bed alarm on 2215 pt still agitated and still trying to climbing out of the bed. Pt has no complaints of pain. Pt is clean. Bed alarm on  2315 Pt climbing out of bed. Pt had small BM. Pt cleaned up and repositioned. Bed alarm on fall mats placed on the floor. 0045 Pt trying to climb out of bed. Pt is agitated. Tech at bed side. Pt was gotten up to the bed side commode.Pt had sm BM. Pt was bathed and gotten back to bed. Bed alarm on. 0130 Pt trying to climb out of bed and increased agitation. Pt is clean. Bed alarm on 0200 Pt was climbing out of bed. Pt is clean. Bed alarm is on.  0220 pt is agitated pulling out IV and monitor. Pt gotten to bedside commode no BM no urine output. Pt gotten back to bed. Bed alarm on. MD paged about pt agitation and confusion and placed restraint order in.  Tried to reorient pt.  Gave pt towels to fold.  RN sat with pt   0410 Pt tried to climb out of bed. RN got Pt up to bed side commode. Pt did not have bm or or urine output. Pt was gotten back to bed. Bed alarm on. RN tried to reorient pt and gave towels to fold. 0505 Pt trying to climb out bed pulling at lines and monitors. Pt placed in posey belt that MD ordered.Family notified.  Will continue to monitor. Pt's bed alarm on.

## 2017-02-16 NOTE — Plan of Care (Signed)
Patient able to take PO meds with no difficulty.

## 2017-02-17 DIAGNOSIS — I10 Essential (primary) hypertension: Secondary | ICD-10-CM

## 2017-02-17 DIAGNOSIS — I272 Pulmonary hypertension, unspecified: Secondary | ICD-10-CM

## 2017-02-17 DIAGNOSIS — Z91199 Patient's noncompliance with other medical treatment and regimen due to unspecified reason: Secondary | ICD-10-CM

## 2017-02-17 DIAGNOSIS — F329 Major depressive disorder, single episode, unspecified: Secondary | ICD-10-CM

## 2017-02-17 DIAGNOSIS — D751 Secondary polycythemia: Secondary | ICD-10-CM

## 2017-02-17 DIAGNOSIS — I159 Secondary hypertension, unspecified: Secondary | ICD-10-CM

## 2017-02-17 DIAGNOSIS — D45 Polycythemia vera: Secondary | ICD-10-CM

## 2017-02-17 DIAGNOSIS — E119 Type 2 diabetes mellitus without complications: Secondary | ICD-10-CM

## 2017-02-17 DIAGNOSIS — N183 Chronic kidney disease, stage 3 unspecified: Secondary | ICD-10-CM

## 2017-02-17 DIAGNOSIS — I071 Rheumatic tricuspid insufficiency: Secondary | ICD-10-CM

## 2017-02-17 DIAGNOSIS — I4891 Unspecified atrial fibrillation: Secondary | ICD-10-CM

## 2017-02-17 DIAGNOSIS — Z9119 Patient's noncompliance with other medical treatment and regimen: Secondary | ICD-10-CM

## 2017-02-17 DIAGNOSIS — I63412 Cerebral infarction due to embolism of left middle cerebral artery: Secondary | ICD-10-CM

## 2017-02-17 DIAGNOSIS — Z8673 Personal history of transient ischemic attack (TIA), and cerebral infarction without residual deficits: Secondary | ICD-10-CM

## 2017-02-17 LAB — PROTEIN C, TOTAL: Protein C, Total: 76 % (ref 60–150)

## 2017-02-17 LAB — FOLATE RBC
FOLATE, HEMOLYSATE: 456.8 ng/mL
FOLATE, RBC: 1085 ng/mL (ref >498)
HEMATOCRIT: 42.1 % (ref 34.0–46.6)

## 2017-02-17 LAB — HOMOCYSTEINE: Homocysteine: 12.9 umol/L (ref 0.0–15.0)

## 2017-02-17 MED ORDER — METOPROLOL TARTRATE 25 MG PO TABS
25.0000 mg | ORAL_TABLET | Freq: Three times a day (TID) | ORAL | Status: DC
  Administered 2017-02-17 – 2017-02-19 (×5): 25 mg via ORAL
  Filled 2017-02-17 (×5): qty 1

## 2017-02-17 MED ORDER — RIVAROXABAN 20 MG PO TABS: 20.0000 mg | ORAL_TABLET | Freq: Every day | ORAL | Status: DC

## 2017-02-17 MED ORDER — RIVAROXABAN 15 MG PO TABS
15.0000 mg | ORAL_TABLET | Freq: Every day | ORAL | Status: DC
  Administered 2017-02-17 – 2017-02-19 (×3): 15 mg via ORAL
  Filled 2017-02-17 (×3): qty 1

## 2017-02-17 NOTE — Progress Notes (Signed)
Inpatient Rehabilitation  Attempted to meet with patient at bedside to discuss team's recommendation for IP Rehab.  Shared booklets; however, due to speech-language impairments patient was unable to participate.  I called son, Elenore Rota and spoke with him about our program.  He expressed feeling like everything has happened so fast and he and his family are exploring their options, but they remain unsure.  Notified nurse case Freight forwarder.  Plan to follow for timing of medical readiness, patient/family decision, insurance authorization, and IP Rehab bed availability.  Call if questions.   Carmelia Roller., CCC/SLP Admission Coordinator  [place]  Cell [phone]

## 2017-02-17 NOTE — Progress Notes (Signed)
PROGRESS NOTE    Kendra Maldonado  GUR:[id number] DOB: [birth year] DOA: 02/14/2017 PCP: Lavone Orn, MD  Outpatient Specialists:   Brief Narrative:   Patient is an 84 y.o. female, w h/o Breast cancer, Pagets disease, Mgus, Dm2 with CKD stage 3, Pafib, CVA.  Patient presented with aphasia, facial asymmetry, and gait instability.  Patient was seen by neurology on presentation, but there was no need for TPA. Pt's family thinks that she has not taken her xarelto in the past 3 days.  According to the patient's son, the patient has been depressed, therefore, has not been taking her medication.  If he just seems to be improving, but the patient remains significantly dysarthric.  Facial asymmetry also persists.  Patient is back to her Eliquis.  His speech therapy is currently evaluating patient's swallowing.  The case was discussed with the neurologist.  Patient remains in atrial fibrillation.  02/17/2017-patient seen alongside patient's nurse.  No new complaints.  Patient is not at her baseline.  Patient's dysarthria persists. A. fib is rate controlled.  Patient's Xarelto will be resumed.  Will consult inpatient rehab team.    Assessment & Plan:   Principal Problem:   Stroke [facility]) Active Problems:   Atrial fibrillation with rapid ventricular response (HCC)   Polycythemia   Hypokalemia   Depression   History of stroke   Non-compliance   Pulmonary hypertension (HCC)   Tricuspid valve insufficiency   Diabetes mellitus type 2 in nonobese (HCC)   Benign essential HTN   Polycythemia vera ([place])   Stage 3 chronic kidney disease ([place])   Stroke: Sart Xarelto in the morning.   Appreciate neurology input.   Follow the results of the speech evaluation. Supportive management. Need to comply with medication was discussed with the patient extensively. Consult inpatient rehabilitation team.  Atrial fibrillation with rapid ventricular response: Heart rate is controlled. Patient is to start  anticoagulation today.   Will defer management of the atrial fibrillation to the cardiology team.  Depression: This seems to be recurrent. Psychiatric input is appreciated. Mirtazapine started.  DVT prophylaxis: Subcu Lovenox. Code Status: Full Family Communication:  Disposition Plan: Will depend on hospital course   Consultants:   Neurology  Cardiology  Procedures:     Antimicrobials:       Subjective: Dysarthria is improving. Patient is not back to her baseline.  Objective: Vitals:   02/17/17 0146 02/17/17 0518 02/17/17 1004 02/17/17 1432  BP: (!) 145/98 113/76 117/71 (!) 126/96  Pulse: (!) 111 93 (!) 120 (!) 116  Resp: 18 18 18 18   Temp: 98 F (36.7 C) 98.1 F (36.7 C) 99.6 F (37.6 C) (!) 97.4 F (36.3 C)  TempSrc: Axillary Axillary Axillary Axillary  SpO2: 98% 94% 92% 94%  Weight:      Height:        Intake/Output Summary (Last 24 hours) at 02/17/2017 1442 Last data filed at 02/17/2017 0900 Gross per 24 hour  Intake 360 ml  Output -  Net 360 ml   Filed Weights   02/15/17 0052  Weight: 68.1 kg (150 lb 2.1 oz)    Examination:  General exam: Appears calm and comfortable. Facial asymmetry.  Dysarthric. Respiratory system: Clear to auscultation.  Cardiovascular system: S1 & S2, irregular.   Gastrointestinal system: Abdomen is nondistended, soft and nontender. No organomegaly or masses felt. Normal bowel sounds heard. Central nervous system: Alert and oriented. Facial asymmetry. Dysarthric. Moves all limbs Extremities: No edema.  Data Reviewed: I have personally reviewed following  labs and imaging studies  CBC: Recent Labs  Lab 02/14/17 1500 02/14/17 1507 02/15/17 0716  WBC 7.7  --  6.7  NEUTROABS 5.5  --   --   HGB 15.4* 17.0* 13.5  HCT 49.9* 50.0* 43.9  MCV 98.2  --  96.7  PLT 136*  --  161*   Basic Metabolic Panel: Recent Labs  Lab 02/14/17 1500 02/14/17 1507 02/15/17 0716 02/16/17 0325  NA 145 144 141 140  K 3.3* 3.2* 2.9*  3.8  CL 99* 98* 99* 98*  CO2 22  --  20* 25  GLUCOSE 92 91 86 117*  BUN 22* 26* 13 16  CREATININE 1.45* 1.10* 1.18* 1.31*  CALCIUM 9.2  --  8.8* 9.4  MG 2.0  --  2.0  --   PHOS  --   --   --  1.9*   GFR: Estimated Creatinine Clearance: 32.8 mL/min (A) (by C-G formula based on SCr of 1.31 mg/dL (H)). Liver Function Tests: Recent Labs  Lab 02/14/17 1500 02/15/17 0716 02/16/17 0325  AST 19 15  --   ALT 12* 11*  --   ALKPHOS 129* 106  --   BILITOT 1.6* 1.2  --   PROT 7.2 6.4*  --   ALBUMIN 4.0 3.5 3.5   No results for input(s): LIPASE, AMYLASE in the last 168 hours. No results for input(s): AMMONIA in the last 168 hours. Coagulation Profile: Recent Labs  Lab 02/14/17 1500  INR 1.15   Cardiac Enzymes: Recent Labs  Lab 02/14/17 2108 02/15/17 0231 02/15/17 0716  TROPONINI <0.03 <0.03 <0.03   BNP (last 3 results) No results for input(s): PROBNP in the last 8760 hours. HbA1C: Recent Labs    02/15/17 0231  HGBA1C 6.3*   CBG: No results for input(s): GLUCAP in the last 168 hours. Lipid Profile: Recent Labs    02/15/17 0231  CHOL 157  HDL 45  LDLCALC 91  TRIG 103  CHOLHDL 3.5   Thyroid Function Tests: Recent Labs    02/15/17 0231  TSH 1.836   Anemia Panel: Recent Labs    02/15/17 0716  VITAMINB12 1,347*   Urine analysis:    Component Value Date/Time   COLORURINE YELLOW 12/31/2016 1215   APPEARANCEUR CLEAR 12/31/2016 1215   LABSPEC 1.015 12/31/2016 1215   PHURINE 5.0 12/31/2016 1215   GLUCOSEU NEGATIVE 12/31/2016 1215   HGBUR NEGATIVE 12/31/2016 1215   BILIRUBINUR NEGATIVE 12/31/2016 1215   KETONESUR 5 (A) 12/31/2016 1215   PROTEINUR NEGATIVE 12/31/2016 1215   UROBILINOGEN 0.2 03/12/2007 1413   NITRITE NEGATIVE 12/31/2016 1215   LEUKOCYTESUR NEGATIVE 12/31/2016 1215   Sepsis Labs: @LABRCNTIP (procalcitonin:4,lacticidven:4)  ) Recent Results (from the past 240 hour(s))  MRSA PCR Screening     Status: None   Collection Time: 02/15/17   2:14 AM  Result Value Ref Range Status   MRSA by PCR NEGATIVE NEGATIVE Final    Comment:        The GeneXpert MRSA Assay (FDA approved for NASAL specimens only), is one component of a comprehensive MRSA colonization surveillance program. It is not intended to diagnose MRSA infection nor to guide or monitor treatment for MRSA infections.          Radiology Studies: No results found.      Scheduled Meds: . atorvastatin  40 mg Oral q1800  . metoprolol tartrate  25 mg Oral TID  . mirtazapine  15 mg Oral QHS  . rivaroxaban  15 mg Oral Q supper  Continuous Infusions:    LOS: 3 days    Time spent: 30 Minutes.   Dana Allan, MD  [facility] Pager #: [phone] 7PM-7AM contact night coverage as above

## 2017-02-17 NOTE — Consult Note (Signed)
Physical Medicine and Rehabilitation Consult   Reason for Consult: Stroke with functional deficits.  Referring Physician: Dr. Marthenia Rolling.    HPI: Kendra Maldonado is a 84 y.o. female with history of T2DM, left breast cancer, HTN, polycythemia, A fib- on Xarelto, CVA who was admitted 02/14/17 with mild right facial droop, difficulty speaking or following commands. History taken from chart review. CTA/P  head/neck done showing small areas of delayed perfusion in left lateral frontal lobe possible small emolism, no emergent large vessel occlusion and chronic bilateral inferior cerebellar infarcts as well as left sided infarct and poor PICA flow since 03/2015.  MRI reviewed showing, left frontal lobe and b/l cerebellar CVAs.  Per report, MRI/MRA brain 2.6 cm infarct in left lateral frontal lobe and progression of large non-acute bilateral cerebellar strokes.  2D echo done revealing EF 55-60% with moderate pulmonary HTN and mild to moderate TVR. Family reported non-compliance with Xarelto. Cardiology consulted for input on A fib with RVR and BB added to IV Cardizem for rate control.   ASA added to Xarelto for secondary stroke prevention--stroke felt to be due to non-compliance with medications.  Psychiatry consulted due to reports of poor po intake as well as being socially withdrawn and concerns of depression.  Remeron was added to help with mood stabilization as well as appetite. Therapy evaluations done revealing balance deficits, cognitive deficits with Wernicke's type aphasia affecting functional status. CIR recommended for follow up therapy.     Review of Systems  Unable to perform ROS: Mental acuity      Past Medical History:  Diagnosis Date  . Back pain   . Breast cancer, left breast ([place]) 2010   lumpectomy  . Cerebrovascular disease    a. 03/2015 - high grade proximal right posterior inferior cerebellar artery stenosis seen on MRA corresponding with distribution of stroke at that time.    . Chronic diarrhea   . CKD (chronic kidney disease), stage III ([place])   . Diabetes mellitus type 2, uncontrolled ([place])   . Essential hypertension   . Hiatal hernia    Stenosis of the spine  . Iron deficiency anemia 01/02/2017  . Lung nodule   . MGUS (monoclonal gammopathy of unknown significance)   . Paget's disease of bone   . Persistent atrial fibrillation ([place])    a. dx 12/2016.  Marland Kitchen Spinal stenosis   . Stroke ([place]) 03/2015  . Symptomatic anemia   . Upper GI bleed 11/2013    Past Surgical History:  Procedure Laterality Date  . BREAST SURGERY    . CHOLECYSTECTOMY    . ESOPHAGOGASTRODUODENOSCOPY (EGD) WITH PROPOFOL N/A 12/10/2013   Procedure: ESOPHAGOGASTRODUODENOSCOPY (EGD) WITH PROPOFOL;  Surgeon: Jeryl Columbia, MD;  Location: [facility] ENDOSCOPY;  Service: Endoscopy;  Laterality: N/A;  . HEMORRHOID SURGERY    . PARTIAL HYSTERECTOMY     Family History  Problem Relation Age of Onset  . Hypertension Mother   . Dementia Father   . Cancer Brother        pancreas    Social History: Per  reports that  has never smoked. she has never used smokeless tobacco. She reports that she does not drink alcohol or use drugs.    Allergies  Allergen Reactions  . Tramadol Other (See Comments)    dizziness, "WENT OUT"  . Codeine Rash  . Demerol [Meperidine] Rash    Medications Prior to Admission  Medication Sig Dispense Refill  . Cholecalciferol (D 2000) 2000 UNITS TABS Take  2,000 Units by mouth daily.    . diclofenac sodium (VOLTAREN) 1 % GEL Apply 1 application topically 3 (three) times daily as needed for pain.    Marland Kitchen diltiazem (CARDIZEM CD) 180 MG 24 hr capsule Take 180 mg by mouth daily.    . diphenoxylate-atropine (LOMOTIL) 2.5-0.025 MG tablet Take 2 tablets by mouth every morning. Take an additional dose as needed for chronic diarrhea, if constipated please hold dose (Patient taking differently: Take 2 tablets by mouth See admin instructions. Take 1 tablet (2 mg) by mouth every morning,  take an additional dose as needed for chronic diarrhea.  If constipated please hold dose) 30 tablet 0  . escitalopram (LEXAPRO) 5 MG tablet Take 5 mg by mouth daily.    . metoprolol tartrate (LOPRESSOR) 100 MG tablet Take 1 tablet (100 mg total) by mouth 2 (two) times daily. 60 tablet 0  . omeprazole (PRILOSEC) 20 MG capsule Take 1 capsule (20 mg total) by mouth 2 (two) times daily before a meal. (Patient taking differently: Take 20 mg by mouth 2 (two) times daily as needed (acid reflux/indigestion). )    . Rivaroxaban (XARELTO) 15 MG TABS tablet Take 1 tablet (15 mg total) by mouth daily with supper. 42 tablet 0  . atorvastatin (LIPITOR) 40 MG tablet Take 1 tablet (40 mg total) by mouth daily at 6 PM. (Patient not taking: Reported on 12/31/2016) 30 tablet 1  . cholestyramine (QUESTRAN) 4 g packet Take 1 packet (4 g total) by mouth 2 (two) times daily. Take other meds 1 hour before or 4-6 hours after cholestyramine (Patient not taking: Reported on 02/14/2017) 60 each 0  . diltiazem (CARDIZEM CD) 240 MG 24 hr capsule Take 1 capsule (240 mg total) by mouth daily. (Patient not taking: Reported on 02/14/2017) 30 capsule 0  . LORazepam (ATIVAN) 0.5 MG tablet Take 1 tablet (0.5 mg total) by mouth 2 (two) times daily as needed for anxiety. (Patient not taking: Reported on 02/14/2017) 10 tablet 0  . traMADol (ULTRAM) 50 MG tablet Take 1 tablet (50 mg total) by mouth every 12 (twelve) hours as needed for moderate pain. (Patient not taking: Reported on 02/14/2017) 12 tablet 0    Home: Home Living Family/patient expects to be discharged to:: Private residence Living Arrangements: Alone Additional Comments: Per RN; pt lived alone PTA. Has had a lot of supportive family in and out since shes been in the hospital. Pt unable to provide any further home set up or PLOF information and no family present.  Functional History: Prior Function Comments: Unsure, pt unable to provide and no family present Functional Status:   Mobility: Bed Mobility Overal bed mobility: Needs Assistance Bed Mobility: Supine to Sit Supine to sit: Min guard, HOB elevated General bed mobility comments: Cues for initiation but pt able to complete bed mobility without physical assist. Increased time and effort Transfers Overall transfer level: Needs assistance Equipment used: 2 person hand held assist Transfers: Sit to/from Stand, Stand Pivot Transfers Sit to Stand: Min assist, +2 physical assistance Stand pivot transfers: Min assist, +2 physical assistance General transfer comment: Min assist +2 to boost up from EOB x1, BSC x2. Transferred EOB>BSC>chair. Ambulation/Gait Ambulation/Gait assistance: Min assist, +2 physical assistance, +2 safety/equipment Ambulation Distance (Feet): 5 Feet Assistive device: 2 person hand held assist Gait Pattern/deviations: Step-to pattern, Step-through pattern, Decreased step length - right, Decreased step length - left, Decreased stride length, Shuffle General Gait Details: pt with modest instability requiring constant min A x2 for stability  and safety Gait velocity: decreased Gait velocity interpretation: Below normal speed for age/gender    ADL: ADL Overall ADL's : Needs assistance/impaired Grooming: Moderate assistance, Sitting Upper Body Bathing: Moderate assistance, Sitting Lower Body Bathing: Maximal assistance, Sit to/from stand Upper Body Dressing : Moderate assistance, Sitting Lower Body Dressing: Maximal assistance, Sit to/from stand Toilet Transfer: Minimal assistance, +2 for physical assistance, Stand-pivot, BSC(HHA) Toileting- Clothing Manipulation and Hygiene: Maximal assistance, Sit to/from stand Toileting - Clothing Manipulation Details (indicate cue type and reason): Pt attempting to perform peri care in standing but required max assist to get clean. Functional mobility during ADLs: Minimal assistance, +2 for physical assistance(for stand pivot only,  HHA)  Cognition: Cognition Overall Cognitive Status: Difficult to assess Arousal/Alertness: Awake/alert Orientation Level: Oriented to person, Disoriented to place, Disoriented to time, Disoriented to situation Attention: Selective Sustained Attention: Appears intact Selective Attention: Impaired Selective Attention Impairment: Functional basic(pt distracted by movement in and outside room) Cognition Arousal/Alertness: Awake/alert Behavior During Therapy: Impulsive Overall Cognitive Status: Difficult to assess Area of Impairment: Following commands, Safety/judgement, Problem solving Following Commands: Follows one step commands consistently Safety/Judgement: Decreased awareness of safety, Decreased awareness of deficits Problem Solving: Decreased initiation, Difficulty sequencing, Requires verbal cues Difficult to assess due to: Impaired communication  Blood pressure 117/71, pulse (!) 120, temperature 99.6 F (37.6 C), temperature source Axillary, resp. rate 18, height 5\' 7"  (1.702 m), weight 68.1 kg (150 lb 2.1 oz), SpO2 92 %. Physical Exam  Nursing note and vitals reviewed. Constitutional: She appears well-developed and well-nourished. No distress.  HENT:  Head: Normocephalic and atraumatic.  Mouth/Throat: Oropharynx is clear and moist.  Eyes: Conjunctivae and EOM are normal. Pupils are equal, round, and reactive to light.  Neck: Normal range of motion. Neck supple.  Cardiovascular:  Irregularly irregular  Respiratory: Effort normal and breath sounds normal. No stridor. No respiratory distress. She has no wheezes.  GI: Soft. Bowel sounds are normal. She exhibits no distension. There is no tenderness.  Musculoskeletal:  No edema or tenderness in extremities  Neurological: She is alert.  Global aphasia.  Verbal output limited  Unable to follow simple motor commands  Motor: limited due to participating, but moving all 4 extremities  Skin: Skin is warm and dry. She is not  diaphoretic.  Psychiatric: Her affect is blunt. She is noncommunicative.    No results found for this or any previous visit (from the past 24 hour(s)). Mr Brain Wo Contrast  Result Date: 02/15/2017 CLINICAL DATA:  Altered level of consciousness.  Receptive aphasia. EXAM: MRI HEAD WITHOUT CONTRAST TECHNIQUE: Multiplanar, multiecho pulse sequences of the brain and surrounding structures were obtained without intravenous contrast. COMPARISON:  CTA and cerebral perfusion from yesterday FINDINGS: Brain: In the area of previously noted delayed perfusion is a lateral left frontal infarct measuring up to 2.6 cm, cortical and white matter. No acute infarct in alternate distribution. No hemorrhagic conversion. Extensive bilateral inferior cerebellar infarction that has progressed on the left when compared to 04/04/2015 brain MRI. There is T1 hyperintensity within the left cerebellar infarct distribution consistent with cortical laminar necrosis or nonacute blood products. Moderate chronic small vessel ischemic type change in the cerebral white matter. No hydrocephalus or masslike finding. Vascular: Major flow voids are preserved. Skull and upper cervical spine: Negative for marrow lesion Sinuses/Orbits: Right cataract resection. Small left maxillary mucous retention cysts. Mild bilateral mastoid opacification. IMPRESSION: 1. 2.6 cm acute infarct in the lateral left frontal lobe. This correlates with the area of delayed perfusion on CTP  yesterday. 2. Large nonacute infarcts of the bilateral inferior cerebellum, progressed on the left since 2017 comparison. Electronically Signed   By: Monte Fantasia M.D.   On: 02/15/2017 13:05    Assessment/Plan: Diagnosis: Left frontal and b/l cerebellar CVAs Labs and images independently reviewed.  Records reviewed and summated above. Stroke: Continue secondary stroke prophylaxis and Risk Factor Modification listed below:   Antiplatelet therapy:   Blood Pressure Management:   Continue current medication with prn's with permisive HTN per primary team Statin Agent:   Diabetes management:     1. Does the need for close, 24 hr/day medical supervision in concert with the patient's rehab needs make it unreasonable for this patient to be served in a less intensive setting? Yes  2. Co-Morbidities requiring supervision/potential complications: depression (ensure mood does not hinder progress of therapies), non-compliance (educate when appropriate), A fib with RVR (recs per Cards), pulmonary HTN (Monitor in accordance with increased physical activity and avoid UE resistance excercises), mild to moderate TVR (see previous), T2 DM (Monitor in accordance with exercise and adjust meds as necessary), left breast cancer, HTN (monitor and provide prns in accordance with increased physical exertion and pain),  Polycythemia (follow labs), history of CVA, CKD (avoid nephrotoxic meds) 3. Due to bladder management, bowel management, safety, skin/wound care, disease management, medication administration and patient education, does the patient require 24 hr/day rehab nursing? Yes 4. Does the patient require coordinated care of a physician, rehab nurse, PT (1-2 hrs/day, 5 days/week), OT (1-2 hrs/day, 5 days/week) and SLP (1-2 hrs/day, 5 days/week) to address physical and functional deficits in the context of the above medical diagnosis(es)? Yes Addressing deficits in the following areas: balance, endurance, locomotion, strength, transferring, bathing, dressing, toileting, cognition, speech, language and psychosocial support 5. Can the patient actively participate in an intensive therapy program of at least 3 hrs of therapy per day at least 5 days per week? Yes 6. The potential for patient to make measurable gains while on inpatient rehab is excellent 7. Anticipated functional outcomes upon discharge from inpatient rehab are supervision and min assist  with PT, supervision and min assist with OT,  supervision and min assist with SLP. 8. Estimated rehab length of stay to reach the above functional goals is: 14-18 days. 9. Anticipated D/C setting: Home 10. Anticipated post D/C treatments: HH therapy and Home excercise program 11. Overall Rehab/Functional Prognosis: good  RECOMMENDATIONS: This patient's condition is appropriate for continued rehabilitative care in the following setting: CIR Patient has agreed to participate in recommended program. Potentially Note that insurance prior authorization may be required for reimbursement for recommended care.  Comment: Rehab Admissions Coordinator to follow up.  Delice Lesch, MD, ABPMR Bary Leriche, PA-C 02/17/2017

## 2017-02-17 NOTE — Progress Notes (Signed)
ANTICOAGULATION CONSULT NOTE - Initial Consult  Pharmacy Consult for Xarelto Indication: atrial fibrillation  Allergies  Allergen Reactions  . Tramadol Other (See Comments)    dizziness, "WENT OUT"  . Codeine Rash  . Demerol [Meperidine] Rash    Patient Measurements: Height: 5\' 7"  (170.2 cm) Weight: 150 lb 2.1 oz (68.1 kg) IBW/kg (Calculated) : 61.6  Vital Signs: Temp: 98.1 F (36.7 C) (01/28 0518) Temp Source: Axillary (01/28 0518) BP: 113/76 (01/28 0518) Pulse Rate: 93 (01/28 0518)  Labs: Recent Labs    02/14/17 1500 02/14/17 1507 02/14/17 2108 02/15/17 0231 02/15/17 0716 02/16/17 0325  HGB 15.4* 17.0*  --   --  13.5  --   HCT 49.9* 50.0*  --   --  43.9  --   PLT 136*  --   --   --  120*  --   APTT 30  --   --   --   --   --   LABPROT 14.6  --   --   --   --   --   INR 1.15  --   --   --   --   --   CREATININE 1.45* 1.10*  --   --  1.18* 1.31*  TROPONINI  --   --  <0.03 <0.03 <0.03  --     Estimated Creatinine Clearance: 32.8 mL/min (A) (by C-G formula based on SCr of 1.31 mg/dL (H)).   Medical History: Past Medical History:  Diagnosis Date  . Back pain   . Breast cancer, left breast ([place]) 2010   lumpectomy  . Cerebrovascular disease    a. 03/2015 - high grade proximal right posterior inferior cerebellar artery stenosis seen on MRA corresponding with distribution of stroke at that time.  . Chronic diarrhea   . CKD (chronic kidney disease), stage III ([place])   . Diabetes mellitus type 2, uncontrolled ([place])   . Essential hypertension   . Hiatal hernia    Stenosis of the spine  . Iron deficiency anemia 01/02/2017  . Lung nodule   . MGUS (monoclonal gammopathy of unknown significance)   . Paget's disease of bone   . Persistent atrial fibrillation ([place])    a. dx 12/2016.  Marland Kitchen Spinal stenosis   . Stroke ([place]) 03/2015  . Symptomatic anemia   . Upper GI bleed 11/2013   Assessment: 83yof on xarelto pta for afib but non compliant (hadn't taken dose in 3  days), admitted with acute CVA. No tPA given. She was started on full dose aspirin. Pharmacy asked to resume xarelto today. Will use lower 15mg  dose since CrCl using TBW is < 82mL/min.  Goal of Therapy:  Monitor platelets by anticoagulation protocol: Yes   Plan:  1) Resume xarelto 15mg  po daily with supper 2) ? Decrease aspirin to 81mg   Deboraha Sprang 02/17/2017,8:49 AM

## 2017-02-17 NOTE — Progress Notes (Signed)
Progress Note  Patient Name: Kendra Maldonado Date of Encounter: 02/17/2017  Primary Cardiologist:  Radford Pax   Subjective   84 year old female with a history of breast cancer, Paget's disease, diabetes mellitus and paroxysmal atrial fibrillation.  She was admitted with a stroke and now has persistent atrial fibrillation.  Is been noncompliant with her anticoagulation.  Echocardiogram from January 26 reveals normal left ventricular systolic function with an ejection fraction of 55-60%. Mild mitral regurgitation Moderate pulmonary hypertension with an estimated PA pressure of 44 mmHg.  Inpatient Medications    Scheduled Meds: . aspirin  300 mg Rectal Daily   Or  . aspirin  325 mg Oral Daily  . atorvastatin  40 mg Oral q1800  . metoprolol tartrate  25 mg Oral BID  . mirtazapine  15 mg Oral QHS  . rivaroxaban  15 mg Oral Q supper   Continuous Infusions:  PRN Meds: acetaminophen **OR** acetaminophen (TYLENOL) oral liquid 160 mg/5 mL **OR** acetaminophen   Vital Signs    Vitals:   02/16/17 2056 02/17/17 0146 02/17/17 0518 02/17/17 1004  BP: (!) 150/99 (!) 145/98 113/76 117/71  Pulse: (!) 122 (!) 111 93 (!) 120  Resp: 20 18 18 18   Temp: 98.3 F (36.8 C) 98 F (36.7 C) 98.1 F (36.7 C) 99.6 F (37.6 C)  TempSrc: Axillary Axillary Axillary Axillary  SpO2: 96% 98% 94% 92%  Weight:      Height:        Intake/Output Summary (Last 24 hours) at 02/17/2017 1021 Last data filed at 02/17/2017 0900 Gross per 24 hour  Intake 718.5 ml  Output 225 ml  Net 493.5 ml   Filed Weights   02/15/17 0052  Weight: 150 lb 2.1 oz (68.1 kg)    Telemetry    Atrial fib  - Personally Reviewed  ECG     atrial fib  - Personally Reviewed  Physical Exam   GEN: elderly female,  Lying in bed,  Unable to have a conversation.  Repeatedly said ": yes" to all of my questions   Neck: No JVD Cardiac: Irreg. Irreg.   Respiratory: Clear to auscultation bilaterally. GI: Soft, nontender,  non-distended  MS: No edema; No deformity. Neuro:  s/p stroke  Psych: unable to assess   Labs    Chemistry Recent Labs  Lab 02/14/17 1500 02/14/17 1507 02/15/17 0716 02/16/17 0325  NA 145 144 141 140  K 3.3* 3.2* 2.9* 3.8  CL 99* 98* 99* 98*  CO2 22  --  20* 25  GLUCOSE 92 91 86 117*  BUN 22* 26* 13 16  CREATININE 1.45* 1.10* 1.18* 1.31*  CALCIUM 9.2  --  8.8* 9.4  PROT 7.2  --  6.4*  --   ALBUMIN 4.0  --  3.5 3.5  AST 19  --  15  --   ALT 12*  --  11*  --   ALKPHOS 129*  --  106  --   BILITOT 1.6*  --  1.2  --   GFRNONAA 33*  --  42* 37*  GFRAA 38*  --  49* 43*  ANIONGAP 24*  --  22* 17*     Hematology Recent Labs  Lab 02/14/17 1500 02/14/17 1507 02/15/17 0716  WBC 7.7  --  6.7  RBC 5.08  --  4.54  HGB 15.4* 17.0* 13.5  HCT 49.9* 50.0* 43.9  MCV 98.2  --  96.7  MCH 30.3  --  29.7  MCHC 30.9  --  30.8  RDW 13.5  --  13.3  PLT 136*  --  120*    Cardiac Enzymes Recent Labs  Lab 02/14/17 2108 02/15/17 0231 02/15/17 0716  TROPONINI <0.03 <0.03 <0.03    Recent Labs  Lab 02/14/17 1506  TROPIPOC 0.02     BNPNo results for input(s): BNP, PROBNP in the last 168 hours.   DDimer No results for input(s): DDIMER in the last 168 hours.   Radiology    Mr Brain Wo Contrast  Result Date: 02/15/2017 CLINICAL DATA:  Altered level of consciousness.  Receptive aphasia. EXAM: MRI HEAD WITHOUT CONTRAST TECHNIQUE: Multiplanar, multiecho pulse sequences of the brain and surrounding structures were obtained without intravenous contrast. COMPARISON:  CTA and cerebral perfusion from yesterday FINDINGS: Brain: In the area of previously noted delayed perfusion is a lateral left frontal infarct measuring up to 2.6 cm, cortical and white matter. No acute infarct in alternate distribution. No hemorrhagic conversion. Extensive bilateral inferior cerebellar infarction that has progressed on the left when compared to 04/04/2015 brain MRI. There is T1 hyperintensity within the left  cerebellar infarct distribution consistent with cortical laminar necrosis or nonacute blood products. Moderate chronic small vessel ischemic type change in the cerebral white matter. No hydrocephalus or masslike finding. Vascular: Major flow voids are preserved. Skull and upper cervical spine: Negative for marrow lesion Sinuses/Orbits: Right cataract resection. Small left maxillary mucous retention cysts. Mild bilateral mastoid opacification. IMPRESSION: 1. 2.6 cm acute infarct in the lateral left frontal lobe. This correlates with the area of delayed perfusion on CTP yesterday. 2. Large nonacute infarcts of the bilateral inferior cerebellum, progressed on the left since 2017 comparison. Electronically Signed   By: Monte Fantasia M.D.   On: 02/15/2017 13:05    Cardiac Studies     Patient Profile     84 y.o. female with AFib and now with a stroke   Assessment & Plan    1.   Atrial fib:   hasnot been compliant with her anticoagulation She is on Xarelto now.  She has been kept on ASA 325 mg.  I think she could stop this .  HR remain a bit elevated.   Will increase Metoprolol to 25 Q 8 hr.     For questions or updates, please contact [place] Please consult [email or web address] for contact info under Cardiology/STEMI.      Signed, Mertie Moores, MD  02/17/2017, 10:21 AM

## 2017-02-17 NOTE — Plan of Care (Signed)
  Education: Knowledge of General Education information will improve 02/17/2017 2047 - Progressing by Bronson Curb, RN   Elimination: Will not experience complications related to bowel motility 02/17/2017 2047 - Progressing by Bronson Curb, RN   Pain Managment: General experience of comfort will improve 02/17/2017 2047 - Progressing by Bronson Curb, RN   Education: Knowledge of disease or condition will improve 02/17/2017 2047 - Progressing by Bronson Curb, RN   Coping: Will verbalize positive feelings about self 02/17/2017 2047 - Progressing by Bronson Curb, RN   Self-Care: Ability to participate in self-care as condition permits will improve 02/17/2017 2047 - Progressing by Bronson Curb, RN   Nutrition: Risk of aspiration will decrease 02/17/2017 2047 - Progressing by Bronson Curb, RN Dietary intake will improve 02/17/2017 2047 - Progressing by Bronson Curb, RN   Ischemic Stroke/TIA Tissue Perfusion: Complications of ischemic stroke/TIA will be minimized 02/17/2017 2047 - Progressing by Georgianna Band, Lanetta Inch, RN

## 2017-02-17 NOTE — Progress Notes (Signed)
Physical Therapy Treatment Patient Details Name: Kendra Maldonado MRN: [id number] DOB: [birth year] Today's Date: 02/17/2017    History of Present Illness Pt is a 84 y.o. female presenting with speech deficits, perseverating, possible receptive aphasia, and gait instability. MRI on 1/26 + for acute infarct in the L frontal lobe. Also noted to have large nonacute infarcts of the bil cerebellum. PMHx: Back pain, Breast cancer, CKD, DM2, Essential HTN, MGUS, Paget's disease, Afib, Spinal stenosis, CVA.    PT Comments    Patient progressing slowly towards PT goals. Requires increased time, gestural cues and repetition to perform tasks. Seemed distracted during session today most likely secondary to needing to have a BM. Incontinent of BM during transfers. Min-Mod A for transfers and gait training today. Able to follow simple 1 step commands with repetition, time and gestural cues. Pt with decreased initiation. Perseverating on 1 phrase throughout session which did not make any sense. Will continue to follow and progress as tolerated.   Follow Up Recommendations  CIR     Equipment Recommendations  None recommended by PT    Recommendations for Other Services       Precautions / Restrictions Precautions Precautions: Fall Restrictions Weight Bearing Restrictions: No    Mobility  Bed Mobility Overal bed mobility: Needs Assistance Bed Mobility: Supine to Sit     Supine to sit: Min assist;HOB elevated     General bed mobility comments: Decreased initiation to perform task requiring tactile and gestural cues to get to EOB. Increased time and effort.   Transfers Overall transfer level: Needs assistance Equipment used: 1 person hand held assist Transfers: Sit to/from Stand Sit to Stand: Min assist         General transfer comment: Cues for anterior weight shift to power to standing, stood from EOB x1, from chair x1.   Ambulation/Gait Ambulation/Gait assistance: Mod  assist Ambulation Distance (Feet): 5 Feet Assistive device: 1 person hand held assist Gait Pattern/deviations: Step-to pattern;Decreased step length - right;Decreased step length - left;Decreased stride length;Shuffle Gait velocity: decreased   General Gait Details: Able to take a few steps to get to chair with pt furniture surfing for support; + BM during transfer which pt seemed distracted by. Posterior lean noted.    Stairs            Wheelchair Mobility    Modified Rankin (Stroke Patients Only) Modified Rankin (Stroke Patients Only) Pre-Morbid Rankin Score: Slight disability Modified Rankin: Moderately severe disability     Balance Overall balance assessment: Needs assistance Sitting-balance support: Feet supported;Single extremity supported Sitting balance-Leahy Scale: Fair Sitting balance - Comments: Requires UE support for static sitting balance.    Standing balance support: Single extremity supported;During functional activity Standing balance-Leahy Scale: Poor Standing balance comment: Requires UE support for static and dynamic standing balance as well as Mod A for safety. Posterior lean noted.                             Cognition Arousal/Alertness: Awake/alert Behavior During Therapy: Flat affect Overall Cognitive Status: Difficult to assess Area of Impairment: Following commands;Safety/judgement;Problem solving                         Safety/Judgement: Decreased awareness of safety;Decreased awareness of deficits   Problem Solving: Decreased initiation;Difficulty sequencing;Requires verbal cues General Comments: Nods yes to name when given choices. Pt follows simple 1 step commands inconsistently, does well with gestural  cues and repetition. Perseverating on a phrase that is nonsensical.      Exercises      General Comments        Pertinent Vitals/Pain Pain Assessment: Faces Faces Pain Scale: No hurt    Home Living                       Prior Function            PT Goals (current goals can now be found in the care plan section) Progress towards PT goals: Progressing toward goals    Frequency    Min 4X/week      PT Plan Current plan remains appropriate    Co-evaluation              AM-PAC PT "6 Clicks" Daily Activity  Outcome Measure  Difficulty turning over in bed (including adjusting bedclothes, sheets and blankets)?: A Little Difficulty moving from lying on back to sitting on the side of the bed? : A Little Difficulty sitting down on and standing up from a chair with arms (e.g., wheelchair, bedside commode, etc,.)?: Unable Help needed moving to and from a bed to chair (including a wheelchair)?: A Little Help needed walking in hospital room?: A Lot Help needed climbing 3-5 steps with a railing? : A Lot 6 Click Score: 14    End of Session Equipment Utilized During Treatment: Gait belt Activity Tolerance: Patient tolerated treatment well Patient left: in chair;with call bell/phone within reach;with nursing/sitter in room;with chair alarm set Nurse Communication: Mobility status PT Visit Diagnosis: Other abnormalities of gait and mobility (R26.89);Other symptoms and signs involving the nervous system (R29.898)     Time: 1116-1140 PT Time Calculation (min) (ACUTE ONLY): 24 min  Charges:  $Therapeutic Activity: 23-37 mins                    G Codes:       Wray Kearns, PT, DPT [phone]     Marguarite Arbour A Avigail Pilling 02/17/2017, 12:31 PM

## 2017-02-18 DIAGNOSIS — F333 Major depressive disorder, recurrent, severe with psychotic symptoms: Secondary | ICD-10-CM

## 2017-02-18 LAB — CBC WITH DIFFERENTIAL/PLATELET
Basophils Absolute: 0 10*3/uL (ref 0.0–0.1)
Basophils Relative: 0 %
Eosinophils Absolute: 0.2 10*3/uL (ref 0.0–0.7)
Eosinophils Relative: 2 %
HCT: 44.8 % (ref 36.0–46.0)
Hemoglobin: 14 g/dL (ref 12.0–15.0)
Lymphocytes Relative: 21 %
Lymphs Abs: 1.6 10*3/uL (ref 0.7–4.0)
MCH: 29.8 pg (ref 26.0–34.0)
MCHC: 31.3 g/dL (ref 30.0–36.0)
MCV: 95.3 fL (ref 78.0–100.0)
Monocytes Absolute: 0.5 10*3/uL (ref 0.1–1.0)
Monocytes Relative: 7 %
Neutro Abs: 5.4 10*3/uL (ref 1.7–7.7)
Neutrophils Relative %: 70 %
Platelets: 109 10*3/uL — ABNORMAL LOW (ref 150–400)
RBC: 4.7 MIL/uL (ref 3.87–5.11)
RDW: 13.4 % (ref 11.5–15.5)
WBC: 7.7 10*3/uL (ref 4.0–10.5)

## 2017-02-18 LAB — DRVVT MIX: DRVVT MIX: 50 s — AB (ref 0.0–47.0)

## 2017-02-18 LAB — RENAL FUNCTION PANEL
Albumin: 2.8 g/dL — ABNORMAL LOW (ref 3.5–5.0)
Anion gap: 12 (ref 5–15)
BUN: 12 mg/dL (ref 6–20)
CO2: 28 mmol/L (ref 22–32)
Calcium: 8.5 mg/dL — ABNORMAL LOW (ref 8.9–10.3)
Chloride: 102 mmol/L (ref 101–111)
Creatinine, Ser: 1.03 mg/dL — ABNORMAL HIGH (ref 0.44–1.00)
GFR calc Af Amer: 57 mL/min — ABNORMAL LOW (ref >60)
GFR calc non Af Amer: 50 mL/min — ABNORMAL LOW (ref >60)
Glucose, Bld: 97 mg/dL (ref 65–99)
Phosphorus: 3.2 mg/dL (ref 2.5–4.6)
Potassium: 2.6 mmol/L — CL (ref 3.5–5.1)
Sodium: 142 mmol/L (ref 135–145)

## 2017-02-18 LAB — LUPUS ANTICOAGULANT PANEL
DRVVT: 70 s — ABNORMAL HIGH (ref 0.0–47.0)
PTT Lupus Anticoagulant: 36.5 s (ref 0.0–51.9)

## 2017-02-18 LAB — URINALYSIS, ROUTINE W REFLEX MICROSCOPIC
Bilirubin Urine: NEGATIVE
Glucose, UA: NEGATIVE mg/dL
Hgb urine dipstick: NEGATIVE
Ketones, ur: NEGATIVE mg/dL
Nitrite: NEGATIVE
Protein, ur: 30 mg/dL — AB
Specific Gravity, Urine: 1.013 (ref 1.005–1.030)
pH: 6 (ref 5.0–8.0)

## 2017-02-18 LAB — PROTEIN C ACTIVITY: PROTEIN C ACTIVITY: 94 % (ref 73–180)

## 2017-02-18 LAB — PROTEIN S, TOTAL: Protein S Ag, Total: 98 % (ref 60–150)

## 2017-02-18 LAB — PROTEIN S ACTIVITY: Protein S Activity: 64 % (ref 63–140)

## 2017-02-18 LAB — DRVVT CONFIRM: DRVVT CONFIRM: 1.5 ratio — AB (ref 0.8–1.2)

## 2017-02-18 MED ORDER — QUETIAPINE FUMARATE 25 MG PO TABS
12.5000 mg | ORAL_TABLET | Freq: Every day | ORAL | Status: DC
  Administered 2017-02-18: 12.5 mg via ORAL
  Filled 2017-02-18: qty 1

## 2017-02-18 MED ORDER — POTASSIUM CHLORIDE 10 MEQ/100ML IV SOLN
10.0000 meq | INTRAVENOUS | Status: AC
  Administered 2017-02-18 (×6): 10 meq via INTRAVENOUS
  Filled 2017-02-18 (×6): qty 100

## 2017-02-18 NOTE — Progress Notes (Signed)
PT Cancellation Note  Patient Details Name: Kendra Maldonado MRN: [id number] DOB: 11/22/1933   Cancelled Treatment:    Reason Eval/Treat Not Completed: Medical issues which prohibited therapy Pt with critical low K+. Awaiting RN to replenish. Will follow as time allows.   Marguarite Arbour A Areli Frary 02/18/2017, 10:53 AM Wray Kearns, [place], DPT [phone]

## 2017-02-18 NOTE — Clinical Social Work Note (Signed)
Clinical Social Work Assessment  Patient Details  Name: Kendra Maldonado MRN: [id number] Date of Birth: 1933/12/14  Date of referral:  02/18/17               Reason for consult:  Facility Placement                Permission sought to share information with:  Facility Sport and exercise psychologist, Family Supports Permission granted to share information::  Yes, Verbal Permission Granted  Name::     Probation officer::  SNF  Relationship::  Daughter-in-law  Contact Information:     Housing/Transportation Living arrangements for the past 2 months:  [place] of Information:  Adult Children Patient Interpreter Needed:  None Criminal Activity/Legal Involvement Pertinent to Current Situation/Hospitalization:  No - Comment as needed Significant Relationships:  Adult Children Lives with:  Self Do you feel safe going back to the place where you live?  Yes Need for family participation in patient care:  Yes (Comment)(patient not oriented)  Care giving concerns:  Patient lives at home alone but will benefit from short term rehab at discharge before looking at long term placement.   Social Worker assessment / plan:  CSW spoke with patient's daughter-in-law Kendra Maldonado over the phone. CSW discussed recommendation for SNF and prior admission to [facility] in December. CSW discussed how the patient will be in copay days, and obtained consent to fax out referral. CSW to follow up with facility preferences and provide bed offers.  Employment status:  Retired Nurse, adult PT Recommendations:  Inpatient [place] / Referral to community resources:  [place]  Patient/Family's Response to care:  Patient's family agreeable to SNF placement.  Patient/Family's Understanding of and Emotional Response to Diagnosis, Current Treatment, and Prognosis:  Patient's DIL discussed how the patient had been at SNF not that long ago at [place]. Patient's DIL  was hopeful that the patient could return. Patient's DIL discussed how the patient will likely need long term placement afterwards, as she really doesn't have the ability to care for herself independently anymore.  Emotional Assessment Appearance:  Appears stated age Attitude/Demeanor/Rapport:  Unable to Assess Affect (typically observed):  Unable to Assess Orientation:  Oriented to Self Alcohol / Substance use:  Not Applicable Psych involvement (Current and /or in the community):  No (Comment)  Discharge Needs  Concerns to be addressed:  Care Coordination Readmission within the last 30 days:  Yes Current discharge risk:  Lives alone, Dependent with Mobility, Cognitively Impaired Barriers to Discharge:  Continued Medical Work up, [place], [place] 02/18/2017, 3:56 PM

## 2017-02-18 NOTE — Care Management Important Message (Signed)
Important Message  Patient Details  Name: Kendra Maldonado MRN: [id number] Date of Birth: 01/01/1934   Medicare Important Message Given:  Yes    [name] 02/18/2017, 12:42 PM

## 2017-02-18 NOTE — Progress Notes (Signed)
Inpatient Rehabilitation  Received follow up call from son, Elenore Rota stating that family has decided on SNF level of post acute rehab.  Note CSW is involved.  Will sign of at this time.  Call if questions.   Carmelia Roller., CCC/SLP Admission Coordinator  [place]  Cell [phone]

## 2017-02-18 NOTE — NC FL2 (Signed)
[place] LEVEL OF CARE SCREENING TOOL     IDENTIFICATION  Patient Name: Kendra Maldonado Birthdate: 11-28-1933 Sex: female Admission Date (Current Location): 02/14/2017  [facility] and [place] Number:  Herbalist and Address:  [facility], [place] [address] [place], [place] [address]      Provider Number: [id number]  Attending Physician Name and Address:  Bonnell Public, MD  Relative Name and Phone Number:       Current Level of Care: Hospital Recommended Level of Care: [place] Prior Approval Number:    Date Approved/Denied:   PASRR Number: [id number]  Discharge Plan: SNF    Current Diagnoses: Patient Active Problem List   Diagnosis Date Noted  . Non-compliance   . Pulmonary hypertension ([place])   . Tricuspid valve insufficiency   . Diabetes mellitus type 2 in nonobese (HCC)   . Benign essential HTN   . Polycythemia vera ([place])   . Stage 3 chronic kidney disease ([place])   . Depression   . History of stroke   . Polycythemia 02/14/2017  . Hypokalemia 02/14/2017  . Persistent atrial fibrillation ([place]) 01/27/2017  . Spinal stenosis   . Paget's disease of bone   . Lung nodule   . Essential hypertension   . Hiatal hernia   . Diabetes mellitus type 2, uncontrolled ([place])   . CKD (chronic kidney disease), stage II   . Cerebrovascular disease   . Dysphagia 01/06/2017  . Iron deficiency anemia 01/02/2017  . Atrial fibrillation with rapid ventricular response ([place])   . Diarrhea 12/31/2016  . Rapid atrial fibrillation ([place]) 12/31/2016  . MGUS (monoclonal gammopathy of unknown significance) 05/15/2016  . HLD (hyperlipidemia) 04/05/2015  . Cerebrovascular accident (CVA) due to occlusion of right cerebellar artery ([place])   . Stroke ([place]) 04/04/2015  . Cerebellar stroke, acute ([place]) 04/04/2015  . Hypertension 04/04/2015  . Stroke (cerebrum) ([place]) 04/04/2015  . Type 2 diabetes mellitus with vascular disease ([place])  04/04/2015  . Anemia due to GI blood loss   . Symptomatic anemia   . Acute posthemorrhagic anemia 12/09/2013  . Upper GI bleed 12/09/2013  . Back pain 12/09/2013  . Chronic diarrhea 12/09/2013  . Breast cancer, left breast ([place]) 01/22/2008    Orientation RESPIRATION BLADDER Height & Weight     Self  Normal Incontinent Weight: 150 lb 2.1 oz (68.1 kg) Height:  5\' 7"  (170.2 cm)  BEHAVIORAL SYMPTOMS/MOOD NEUROLOGICAL BOWEL NUTRITION STATUS      Incontinent Diet(heart healthy)  AMBULATORY STATUS COMMUNICATION OF NEEDS Skin   Extensive Assist Verbally Normal                       Personal Care Assistance Level of Assistance  Bathing, Feeding, Dressing Bathing Assistance: Maximum assistance Feeding assistance: Limited assistance Dressing Assistance: Maximum assistance     Functional Limitations Info  Sight, Hearing, Speech Sight Info: Adequate Hearing Info: Impaired Speech Info: Impaired(expressive aphasia)    SPECIAL CARE FACTORS FREQUENCY  PT (By licensed PT), OT (By licensed OT)     PT Frequency: 5x/wk OT Frequency: 5x/wk            Contractures Contractures Info: Not present    Additional Factors Info  Code Status, Allergies, Psychotropic Code Status Info: Full Allergies Info: Tramadol, Codeine, Demerol Meperidine Psychotropic Info: Remeron 15mg  at bed; Seroquel 12.5 mg at bed         Current Medications (02/18/2017):  This is the  current hospital active medication list Current Facility-Administered Medications  Medication Dose Route Frequency Provider Last Rate Last Dose  . acetaminophen (TYLENOL) tablet 650 mg  650 mg Oral Q4H PRN Jani Gravel, MD   650 mg at 02/15/17 2359   Or  . acetaminophen (TYLENOL) solution 650 mg  650 mg Per Tube Q4H PRN Jani Gravel, MD       Or  . acetaminophen (TYLENOL) suppository 650 mg  650 mg Rectal Q4H PRN Jani Gravel, MD      . atorvastatin (LIPITOR) tablet 40 mg  40 mg Oral q1800 Rosalin Hawking, MD   40 mg at 02/17/17 1754   . metoprolol tartrate (LOPRESSOR) tablet 25 mg  25 mg Oral TID Nahser, Wonda Cheng, MD   25 mg at 02/17/17 2142  . mirtazapine (REMERON SOL-TAB) disintegrating tablet 15 mg  15 mg Oral QHS Akintayo, Mojeed, MD   15 mg at 02/17/17 2142  . QUEtiapine (SEROQUEL) tablet 12.5 mg  12.5 mg Oral QHS Dana Allan I, MD      . Rivaroxaban Alveda Reasons) tablet 15 mg  15 mg Oral Q supper Otilio Miu, [place]   15 mg at 02/17/17 1753     Discharge Medications: Please see discharge summary for a list of discharge medications.  Relevant Imaging Results:  Relevant Lab Results:   Additional Information SS#: [id number]  Geralynn Ochs, LCSW

## 2017-02-18 NOTE — Progress Notes (Signed)
Received call from Lab about Critical K of 2.6. MD notified and report to nurse.

## 2017-02-18 NOTE — Progress Notes (Signed)
Attempted to see pt this am. Pt's K+ is 2.6.  Pt not taking anything by mouth right now so it has not been replenished.  Will continue to check back to see pt when K+ is within range. Jinger Neighbors, [place] [phone]

## 2017-02-18 NOTE — Progress Notes (Signed)
  Speech Language Pathology Treatment: Cognitive-Linquistic  Patient Details Name: Kendra Maldonado MRN: [id number] DOB: 1933/05/21 Today's Date: 02/18/2017 Time: 1308-6578 SLP Time Calculation (min) (ACUTE ONLY): 17 min  Assessment / Plan / Recommendation Clinical Impression  Skilled ST session to facilitate comprehension of basic information and functional communication. She expressed herself  x 1 stating "hurt" while holding her neck. Majority of utterances were perseverated neologisms "gaf gaf gafney". Given verbal cue for first word she stated days of the week and required mod-max verbal assist for the months. Named 4/4 common objects on tray. Answered responsive naming questions accurately (her name and 2 sons, unable to state relation to [facility] listed in chart). Written words "yes, no" were not helpful during this session. Continue ST moving towards communicative independence here and at SNF.   HPI HPI: Pt is a 84 y.o. female presenting with speech deficits, perseverating, possible receptive aphasia, and gait instability. MRI on 1/26 + for acute infarct in the L frontal lobe. Also noted to have large nonacute infarcts of the bil cerebellum. PMHx: Back pain, Breast cancer, CKD, DM2, Essential HTN, MGUS, Paget's disease, Afib, Spinal stenosis, CVA.      SLP Plan  Continue with current plan of care       Recommendations                   Follow up Recommendations: Inpatient Rehab SLP Visit Diagnosis: Aphasia (R47.01) Plan: Continue with current plan of care       [place], Kendra Maldonado 02/18/2017, 2:07 PM  Kendra Maldonado.Ed [employer] [phone]

## 2017-02-18 NOTE — Progress Notes (Signed)
PROGRESS NOTE    Kendra Maldonado  EUM:[id number] DOB: 1933/09/10 DOA: 02/14/2017 PCP: Lavone Orn, MD  Outpatient Specialists:   Brief Narrative:   Patient is an 84 year old female with past medical history significant for Breast cancer, Pagets disease, Mgus, Dm2 with CKD stage 3, Pafib, CVA.  Patient presented with aphasia, facial asymmetry, and gait instability.  Patient was seen by neurology on presentation, but there was no need for TPA.  According to the patient's family, the patient has not been very compliant with medication.  The patient was reported not to have taken her Xarelto for about 3 days prior to presentation.  MRI of the brain done on 02/15/2017 revealed 2.6 cm acute infarct in the lateral left frontal lobe.  MRI of the brain also revealed a large nonacute infarcts of the bilateral inferior cerebellum, said to have progressed since 2017.  They patient has been seen by the physical therapy team, neurology team and acute rehabilitation has been advised.  The patient is awaiting discharge to a rehab unit.  Patient is back to Xarelto 15 mg p.o. once daily.  Patient was also seen by the psychiatrist for depression, and started on mirtazapine.  02/18/2017-patient seen today.  Patient remains dysarthric.  Patient seemed a bit paranoid, would not comply with full examination today.    Assessment & Plan:   Principal Problem:   Stroke [facility]) Active Problems:   Atrial fibrillation with rapid ventricular response (HCC)   Polycythemia   Hypokalemia   Depression   History of stroke   Non-compliance   Pulmonary hypertension (HCC)   Tricuspid valve insufficiency   Diabetes mellitus type 2 in nonobese (HCC)   Benign essential HTN   Polycythemia vera ([place])   Stage 3 chronic kidney disease ([place])   Stroke: Continue Xarelto.   Appreciate neurology input.  Pursue rehabilitation. Follow the results of the speech evaluation. Supportive management. Need to comply with medication was  discussed with the patient extensively.   Atrial fibrillation with rapid ventricular response: Heart rate is controlled. Patient is back on anticoagulation.    Depression: This seems to be recurrent. Hopefully, the patient is not developing psychotic features of depression.  Will add quetiapine. Psychiatric input is appreciated. Continue mirtazapine.  Chronic kidney disease stage III: Stable. Creatinine today is 1.03.  Hypokalemia: Potassium is 2.6 today. Will start patient on IV KCl. Will follow magnesium level as well.  Dyslipidemia. LDL on 02/15/2017 was 91. Will continue statins.  DVT prophylaxis: Xarelto.. Code Status: Full Family Communication:  Disposition Plan: Rehabilitation.   Consultants:   Neurology  Cardiology  Procedures:     Antimicrobials:       Subjective: Patient is paranoid today.   Dysarthria persists. Patient is not back to her baseline.  Objective: Vitals:   02/17/17 2200 02/18/17 0200 02/18/17 0600 02/18/17 1414  BP: 134/89 (!) 156/88 (!) 148/86 (!) 142/105  Pulse: (!) 102 (!) 107 (!) 101 (!) 112  Resp: 18 18 18 20   Temp: 98.7 F (37.1 C) 98.3 F (36.8 C) 98.2 F (36.8 C) 97.8 F (36.6 C)  TempSrc: Oral Oral Oral Oral  SpO2: 97% 95% 94% 95%  Weight:      Height:        Intake/Output Summary (Last 24 hours) at 02/18/2017 1623 Last data filed at 02/18/2017 0920 Gross per 24 hour  Intake 300 ml  Output 200 ml  Net 100 ml   Filed Weights   02/15/17 0052  Weight: 68.1 kg (150 lb 2.1  oz)    Examination:  General exam: Appears calm and comfortable. Facial asymmetry.  Dysarthric. Very Paranoid Respiratory system: Clear to auscultation.  Cardiovascular system: S1 & S2, irregular.   Gastrointestinal system: Abdomen is nondistended, soft and nontender. No organomegaly or masses felt. Normal bowel sounds heard. Central nervous system: Alert and oriented. Facial asymmetry. Dysarthric. Moves all limbs Extremities: No  edema.  Data Reviewed: I have personally reviewed following labs and imaging studies  CBC: Recent Labs  Lab 02/14/17 1500 02/14/17 1507 02/15/17 0231 02/15/17 0716 02/18/17 0841  WBC 7.7  --   --  6.7 7.7  NEUTROABS 5.5  --   --   --  5.4  HGB 15.4* 17.0*  --  13.5 14.0  HCT 49.9* 50.0* 42.1 43.9 44.8  MCV 98.2  --   --  96.7 95.3  PLT 136*  --   --  120* 902*   Basic Metabolic Panel: Recent Labs  Lab 02/14/17 1500 02/14/17 1507 02/15/17 0716 02/16/17 0325 02/18/17 0841  NA 145 144 141 140 142  K 3.3* 3.2* 2.9* 3.8 2.6*  CL 99* 98* 99* 98* 102  CO2 22  --  20* 25 28  GLUCOSE 92 91 86 117* 97  BUN 22* 26* 13 16 12   CREATININE 1.45* 1.10* 1.18* 1.31* 1.03*  CALCIUM 9.2  --  8.8* 9.4 8.5*  MG 2.0  --  2.0  --   --   PHOS  --   --   --  1.9* 3.2   GFR: Estimated Creatinine Clearance: 41.7 mL/min (A) (by C-G formula based on SCr of 1.03 mg/dL (H)). Liver Function Tests: Recent Labs  Lab 02/14/17 1500 02/15/17 0716 02/16/17 0325 02/18/17 0841  AST 19 15  --   --   ALT 12* 11*  --   --   ALKPHOS 129* 106  --   --   BILITOT 1.6* 1.2  --   --   PROT 7.2 6.4*  --   --   ALBUMIN 4.0 3.5 3.5 2.8*   No results for input(s): LIPASE, AMYLASE in the last 168 hours. No results for input(s): AMMONIA in the last 168 hours. Coagulation Profile: Recent Labs  Lab 02/14/17 1500  INR 1.15   Cardiac Enzymes: Recent Labs  Lab 02/14/17 2108 02/15/17 0231 02/15/17 0716  TROPONINI <0.03 <0.03 <0.03   BNP (last 3 results) No results for input(s): PROBNP in the last 8760 hours. HbA1C: No results for input(s): HGBA1C in the last 72 hours. CBG: No results for input(s): GLUCAP in the last 168 hours. Lipid Profile: No results for input(s): CHOL, HDL, LDLCALC, TRIG, CHOLHDL, LDLDIRECT in the last 72 hours. Thyroid Function Tests: No results for input(s): TSH, T4TOTAL, FREET4, T3FREE, THYROIDAB in the last 72 hours. Anemia Panel: No results for input(s): VITAMINB12,  FOLATE, FERRITIN, TIBC, IRON, RETICCTPCT in the last 72 hours. Urine analysis:    Component Value Date/Time   COLORURINE YELLOW 12/31/2016 1215   APPEARANCEUR CLEAR 12/31/2016 1215   LABSPEC 1.015 12/31/2016 1215   PHURINE 5.0 12/31/2016 1215   GLUCOSEU NEGATIVE 12/31/2016 1215   HGBUR NEGATIVE 12/31/2016 1215   BILIRUBINUR NEGATIVE 12/31/2016 1215   KETONESUR 5 (A) 12/31/2016 1215   PROTEINUR NEGATIVE 12/31/2016 1215   UROBILINOGEN 0.2 03/12/2007 1413   NITRITE NEGATIVE 12/31/2016 1215   LEUKOCYTESUR NEGATIVE 12/31/2016 1215   Sepsis Labs: @LABRCNTIP (procalcitonin:4,lacticidven:4)  ) Recent Results (from the past 240 hour(s))  MRSA PCR Screening     Status: None  Collection Time: 02/15/17  2:14 AM  Result Value Ref Range Status   MRSA by PCR NEGATIVE NEGATIVE Final    Comment:        The GeneXpert MRSA Assay (FDA approved for NASAL specimens only), is one component of a comprehensive MRSA colonization surveillance program. It is not intended to diagnose MRSA infection nor to guide or monitor treatment for MRSA infections.          Radiology Studies: No results found.      Scheduled Meds: . atorvastatin  40 mg Oral q1800  . metoprolol tartrate  25 mg Oral TID  . mirtazapine  15 mg Oral QHS  . QUEtiapine  12.5 mg Oral QHS  . rivaroxaban  15 mg Oral Q supper   Continuous Infusions: . potassium chloride 10 mEq (02/18/17 1414)     LOS: 4 days    Time spent: 40 Minutes.   Dana Allan, MD  [facility] Pager #: [phone] 7PM-7AM contact night coverage as above

## 2017-02-19 LAB — GLUCOSE, CAPILLARY: Glucose-Capillary: 144 mg/dL — ABNORMAL HIGH (ref 65–99)

## 2017-02-19 LAB — BASIC METABOLIC PANEL
Anion gap: 16 — ABNORMAL HIGH (ref 5–15)
BUN: 15 mg/dL (ref 6–20)
CO2: 20 mmol/L — ABNORMAL LOW (ref 22–32)
Calcium: 8.6 mg/dL — ABNORMAL LOW (ref 8.9–10.3)
Chloride: 105 mmol/L (ref 101–111)
Creatinine, Ser: 1.02 mg/dL — ABNORMAL HIGH (ref 0.44–1.00)
GFR calc Af Amer: 58 mL/min — ABNORMAL LOW (ref >60)
GFR calc non Af Amer: 50 mL/min — ABNORMAL LOW (ref >60)
Glucose, Bld: 86 mg/dL (ref 65–99)
Potassium: 3.5 mmol/L (ref 3.5–5.1)
Sodium: 141 mmol/L (ref 135–145)

## 2017-02-19 MED ORDER — POTASSIUM CHLORIDE ER 20 MEQ PO TBCR: 10.0000 meq | EXTENDED_RELEASE_TABLET | Freq: Two times a day (BID) | ORAL | 0 refills | Status: DC

## 2017-02-19 MED ORDER — SODIUM CHLORIDE 0.9 % IV BOLUS (SEPSIS)
500.0000 mL | Freq: Once | INTRAVENOUS | Status: AC
  Administered 2017-02-19: 500 mL via INTRAVENOUS

## 2017-02-19 MED ORDER — MIRTAZAPINE 15 MG PO TBDP: 15.0000 mg | ORAL_TABLET | Freq: Every day | ORAL | 0 refills | Status: DC

## 2017-02-19 MED ORDER — METOPROLOL TARTRATE 50 MG PO TABS: 50.0000 mg | ORAL_TABLET | Freq: Three times a day (TID) | ORAL | Status: DC

## 2017-02-19 MED ORDER — METOPROLOL TARTRATE 50 MG PO TABS: 50.0000 mg | ORAL_TABLET | Freq: Three times a day (TID) | ORAL | 0 refills | Status: DC

## 2017-02-19 MED ORDER — QUETIAPINE FUMARATE 25 MG PO TABS: 12.5000 mg | ORAL_TABLET | Freq: Every day | ORAL | 0 refills | Status: DC

## 2017-02-19 NOTE — Plan of Care (Signed)
  Education: Knowledge of disease or condition will improve 02/19/2017 0243 - Progressing by Bronson Curb, RN Knowledge of secondary prevention will improve 02/19/2017 0243 - Progressing by Bronson Curb, RN Knowledge of patient specific risk factors addressed and post discharge goals established will improve 02/19/2017 0243 - Progressing by Bronson Curb, RN   Coping: Will verbalize positive feelings about self 02/19/2017 0243 - Progressing by Bronson Curb, RN   Health Behavior/Discharge Planning: Ability to manage health-related needs will improve 02/19/2017 0243 - Progressing by Bronson Curb, RN   Self-Care: Ability to participate in self-care as condition permits will improve 02/19/2017 0243 - Progressing by Bronson Curb, RN Verbalization of feelings and concerns over difficulty with self-care will improve 02/19/2017 0243 - Progressing by Bronson Curb, RN Ability to communicate needs accurately will improve 02/19/2017 0243 - Progressing by Jakai Onofre, Lanetta Inch, RN

## 2017-02-19 NOTE — Progress Notes (Signed)
Clinical Social Worker facilitated patient discharge including contacting patient family and facility to confirm patient discharge plans.  Clinical information faxed to facility and family agreeable with plan.  CSW arranged ambulance transport via [facility] to [facility] .  RN to call [phone] (pt will go in room 1007) for report prior to discharge.  Clinical Social Worker will sign off for now as social work intervention is no longer needed. Please consult [place] again if new need arises.  Rhea Pink, MSW, [place]

## 2017-02-19 NOTE — Discharge Summary (Signed)
Physician Discharge Summary  Patient ID: Kendra Maldonado MRN: [id number] DOB/AGE: August 17, 1933 84 y.o.  Admit date: 82/25/2019 Discharge date: 02/19/2017  Admission Diagnoses:  Discharge Diagnoses:  Principal Problem:   Stroke [facility]) Active Problems:   Atrial fibrillation with rapid ventricular response (HCC)   Polycythemia   Hypokalemia   Depression   History of stroke   Non-compliance   Pulmonary hypertension (HCC)   Tricuspid valve insufficiency   Diabetes mellitus type 2 in nonobese (HCC)   Benign essential HTN   Polycythemia vera (HCC)   Stage 3 chronic kidney disease ([place])   Discharged Condition: stable  Hospital Course: Patient is an 84 year old Caucasian Caucasian female with past medical history significant for Breast cancer, Pagets disease, Mgus, Dm2 with CKD stage 3, Pafib, CVA.  Patient was admitted with aphasia, facial asymmetry, and gait instability.    On presentation, earlier with concerns for possible acute CVA.  Neurology team was consulted.  And it was patient was seen by neurology on presentation, and it was decided that TPA was not indicated.  According to the patient's family, the patient was not compliant with medications prior to admission.  Specifically, the patient was not taking her anticoagulation.  The none compliance was said to be secondary to significant depressive illness.  MRI of the brain done on 02/15/2017 revealed 2.6 cm acute infarct in the lateral left frontal lobe.  MRI of the brain also revealed a large nonacute infarcts of the bilateral inferior cerebellum, said to have progressed since 2017.  The patient was admitted for further assessment and management.  On admission, the patient's anticoagulation was initially held for 2-3 days, and Xarelto was later resumed.  The patient was assessed by the physical therapy team and inpatient rehabilitation has been recommended.  Cardiology team was also consulted for atrial fibrillation with rapid ventricular  response.  Cardiology team advised discontinuing Cardizem, and continue with only beta-blocker.  Currently, the patient is on metoprolol 50 mg p.o. twice daily.  The goal is to get patient back to the dose of 100 mg p.o. twice daily of metoprolol.  Psychiatry team was also consulted to assess patient's depression.  The patient was started on mirtazapine.  The patient is optimized, and is ready for discharge to rehabilitation facility.  The patient will need to follow with the primary care provider, psychiatry, and neurology team within 1-2 weeks.  Patient's potassium was also noted to be low on this admission.  The patient will be discharged back home on supplemental potassium.  There will be need for a primary care provider to monitor renal function and electrolytes.  1   Consults: cardiology, neurology and psychiatry  Significant Diagnostic Studies: radiology: MRII Brain - 2.6 cm acute infarct in the lateral left frontal lobe. This correlates with the area of delayed perfusion on CTP yesterday. Large nonacute infarcts of the bilateral inferior cerebellum, progressed on the left since 2017 comparison  CT Angio Head/Neck - No emergent large vessel occlusion.  No infarct by CT perfusion. Overall mild atherosclerosis in the head neck. No correctable flow limiting stenosis. Small area of delayed perfusion in the lateral left frontal lobe, possibly reflecting a small embolism not resolved by CTA. Bilateral inferior cerebellar infarcts with a chronic appearance. The left-sided infarct and bilateral poor pica flow has developed since 04/04/2015.  Treatments: See abpve.  Discharge Exam: Blood pressure (!) 148/87, pulse (!) 122, temperature 98.2 F (36.8 C), temperature source Rectal, resp. rate 18, height 5\' 7"  (1.702 m), weight 68.1  kg (150 lb 2.1 oz), SpO2 100 %.   Disposition: 03-Skilled Nursing Facility  Discharge Instructions    Ambulatory referral to Neurology   Complete by:  As directed    Pt  will follow up with Dr. Jannifer Franklin at [facility] in about 6 weeks. Thanks.   Call MD for:   Complete by:  As directed    Call MD if symptoms worsen   Diet - low sodium heart healthy   Complete by:  As directed    Increase activity slowly   Complete by:  As directed      Allergies as of 02/19/2017      Reactions   Tramadol Other (See Comments)   dizziness, "WENT OUT"   Codeine Rash   Demerol [meperidine] Rash      Medication List    STOP taking these medications   cholestyramine 4 g packet Commonly known as:  QUESTRAN   diclofenac sodium 1 % Gel Commonly known as:  VOLTAREN   diltiazem 180 MG 24 hr capsule Commonly known as:  CARDIZEM CD   diltiazem 240 MG 24 hr capsule Commonly known as:  CARDIZEM CD   diphenoxylate-atropine 2.5-0.025 MG tablet Commonly known as:  LOMOTIL   escitalopram 5 MG tablet Commonly known as:  LEXAPRO   LORazepam 0.5 MG tablet Commonly known as:  ATIVAN   traMADol 50 MG tablet Commonly known as:  ULTRAM     TAKE these medications   atorvastatin 40 MG tablet Commonly known as:  LIPITOR Take 1 tablet (40 mg total) by mouth daily at 6 PM.   D 2000 2000 units Tabs Generic drug:  Cholecalciferol Take 2,000 Units by mouth daily.   metoprolol tartrate 50 MG tablet Commonly known as:  LOPRESSOR Take 1 tablet (50 mg total) by mouth every 8 (eight) hours. What changed:    medication strength  how much to take  when to take this   mirtazapine 15 MG disintegrating tablet Commonly known as:  REMERON SOL-TAB Take 1 tablet (15 mg total) by mouth at bedtime.   omeprazole 20 MG capsule Commonly known as:  PRILOSEC Take 1 capsule (20 mg total) by mouth 2 (two) times daily before a meal. What changed:    when to take this  reasons to take this   Potassium Chloride ER 20 MEQ Tbcr Take 10 mEq by mouth 2 (two) times daily for 7 days.   QUEtiapine 25 MG tablet Commonly known as:  SEROQUEL Take 0.5 tablets (12.5 mg total) by mouth at bedtime.    Rivaroxaban 15 MG Tabs tablet Commonly known as:  XARELTO Take 1 tablet (15 mg total) by mouth daily with supper.      Follow-up Information    Kathrynn Ducking, MD. Schedule an appointment as soon as possible for a visit in 6 week(s).   Specialty:  Neurology Contact information: [address] [place] [address] [phone]          35 minutes were spent in discharging this patient.  SignedBonnell Public 02/19/2017, 1:05 PM

## 2017-02-19 NOTE — Progress Notes (Signed)
Pt with 161ml via bladder scan.  Noted no void since I & O cath at 1845 last shift.  Pt with poor PO intake and NS @ KVO.  Kendra Blinks, NP notified.  Orders received for IV NS Bolus.  Bolus given per order.

## 2017-02-19 NOTE — Plan of Care (Signed)
Progressing in terms of strength/balance.

## 2017-02-19 NOTE — Progress Notes (Signed)
Physical Therapy Treatment Patient Details Name: Kendra Maldonado MRN: [id number] DOB: 22-Jun-1933 Today's Date: 02/19/2017    History of Present Illness Pt is a 84 y.o. female presenting with speech deficits, perseverating, possible receptive aphasia, and gait instability. MRI on 1/26 + for acute infarct in the L frontal lobe. Also noted to have large nonacute infarcts of the bil cerebellum. PMHx: Back pain, Breast cancer, CKD, DM2, Essential HTN, MGUS, Paget's disease, Afib, Spinal stenosis, CVA.    PT Comments    Session focus on attention and following 1-step commands for functional mobility and therapeutic exercise.  Pt completes 8-15 reps hip flexion, LAQ, and heel raises from seated position with max cues for attention to task.  Sit<>stand x2 with posterior LOB on first attempt, min assist to maintain forward weight shift.  Attempted forward/retro gait with RW, however pt extremely difficult to direct 2/2 not following commands at all.  Mod assist and max multimodal cues to return to recliner.  Positioned to comfort with call bell in reach and chair alarm activated.    Follow Up Recommendations  SNF     Equipment Recommendations  None recommended by PT    Recommendations for Other Services       Precautions / Restrictions Precautions Precautions: Fall    Mobility  Bed Mobility                  Transfers Overall transfer level: Needs assistance Equipment used: Rolling walker (2 wheeled) Transfers: Sit to/from Stand Sit to Stand: Min assist Stand pivot transfers: Min assist       General transfer comment: pt able to power up with min assist, however loses balance posteriorly 2/2 retropulsion.  On second attempt with facilitation for forward weight shift, able to come to standing and remain standing with min assist  Ambulation/Gait Ambulation/Gait assistance: Min assist Ambulation Distance (Feet): 10 Feet Assistive device: Rolling walker (2 wheeled)        General Gait Details: able to ambulate forwards/backwards/turn with min assist but max multimodal cues for problem solving and following commands   Stairs            Wheelchair Mobility    Modified Rankin (Stroke Patients Only)       Balance                                            Cognition Arousal/Alertness: Awake/alert Behavior During Therapy: Restless Overall Cognitive Status: Impaired/Different from baseline Area of Impairment: Following commands;Safety/judgement;Awareness;Problem solving;Attention                   Current Attention Level: Focused   Following Commands: Follows one step commands inconsistently Safety/Judgement: Decreased awareness of safety;Decreased awareness of deficits   Problem Solving: Slow processing;Decreased initiation;Difficulty sequencing;Requires verbal cues;Requires tactile cues        Exercises      General Comments        Pertinent Vitals/Pain Pain Assessment: Faces Faces Pain Scale: No hurt    Home Living                      Prior Function            PT Goals (current goals can now be found in the care plan section) Acute Rehab PT Goals PT Goal Formulation: Patient unable to participate in goal setting Time For  Goal Achievement: 03/01/17 Potential to Achieve Goals: Good Progress towards PT goals: Progressing toward goals    Frequency    Min 3X/week      PT Plan Discharge plan needs to be updated    Co-evaluation              AM-PAC PT "6 Clicks" Daily Activity  Outcome Measure  Difficulty turning over in bed (including adjusting bedclothes, sheets and blankets)?: A Little Difficulty moving from lying on back to sitting on the side of the bed? : A Little Difficulty sitting down on and standing up from a chair with arms (e.g., wheelchair, bedside commode, etc,.)?: A Little Help needed moving to and from a bed to chair (including a wheelchair)?: A Little Help  needed walking in hospital room?: A Little Help needed climbing 3-5 steps with a railing? : A Lot 6 Click Score: 17    End of Session   Activity Tolerance: Patient tolerated treatment well Patient left: in chair;with call bell/phone within reach;with chair alarm set   PT Visit Diagnosis: Unsteadiness on feet (R26.81);Muscle weakness (generalized) (M62.81)     Time: 1450-1505 PT Time Calculation (min) (ACUTE ONLY): 15 min  Charges:  $Therapeutic Activity: 8-22 mins                    G Codes:          Michel Santee 02/19/2017, 3:36 PM

## 2017-02-19 NOTE — Progress Notes (Signed)
Progress Note  Patient Name: Kendra Maldonado Date of Encounter: 02/19/2017  Primary Cardiologist: Dr. Meda Coffee  Subjective   Remains dysarthric. Pleasant with me. No CP, SOB.  Inpatient Medications    Scheduled Meds: . atorvastatin  40 mg Oral q1800  . metoprolol tartrate  25 mg Oral TID  . mirtazapine  15 mg Oral QHS  . QUEtiapine  12.5 mg Oral QHS  . rivaroxaban  15 mg Oral Q supper   Continuous Infusions:  PRN Meds: acetaminophen **OR** acetaminophen (TYLENOL) oral liquid 160 mg/5 mL **OR** acetaminophen   Vital Signs    Vitals:   02/18/17 2237 02/19/17 0126 02/19/17 0627 02/19/17 1007  BP:  137/90 127/78 (!) 148/87  Pulse: 95 (!) 113 (!) 122 (!) 122  Resp: 20 18 16 18   Temp:   97.7 F (36.5 C) 98.2 F (36.8 C)  TempSrc:   Axillary Rectal  SpO2: 96% 97% 98% 100%  Weight:      Height:        Intake/Output Summary (Last 24 hours) at 02/19/2017 1130 Last data filed at 02/19/2017 0800 Gross per 24 hour  Intake 600 ml  Output -  Net 600 ml   Filed Weights   02/15/17 0052  Weight: 150 lb 2.1 oz (68.1 kg)    Telemetry    Atrial fib rates 110-120s- Personally Reviewed  Physical Exam   GEN: No acute distress.  HEENT: Normocephalic, atraumatic, sclera non-icteric. Neck: No JVD or bruits. Cardiac: Rapid, irregular, no murmurs, rubs, or gallops.  Radials/DP/PT 1+ and equal bilaterally.  Respiratory: Clear to auscultation bilaterally. Breathing is unlabored. GI: Soft, nontender, non-distended, BS +x 4. MS: no deformity. Extremities: No clubbing or cyanosis. No edema. Distal pedal pulses are 2+ and equal bilaterally. Neuro:  Able to follows commands, answers questions with yes/no but struggles to provide any coherent sentence structure beyond that Psych: Calm affect  Labs    Chemistry Recent Labs  Lab 02/14/17 1500  02/15/17 0716 02/16/17 0325 02/18/17 0841 02/19/17 0907  NA 145   < > 141 140 142 141  K 3.3*   < > 2.9* 3.8 2.6* 3.5  CL 99*   < >  99* 98* 102 105  CO2 22  --  20* 25 28 20*  GLUCOSE 92   < > 86 117* 97 86  BUN 22*   < > 13 16 12 15   CREATININE 1.45*   < > 1.18* 1.31* 1.03* 1.02*  CALCIUM 9.2  --  8.8* 9.4 8.5* 8.6*  PROT 7.2  --  6.4*  --   --   --   ALBUMIN 4.0  --  3.5 3.5 2.8*  --   AST 19  --  15  --   --   --   ALT 12*  --  11*  --   --   --   ALKPHOS 129*  --  106  --   --   --   BILITOT 1.6*  --  1.2  --   --   --   GFRNONAA 33*  --  42* 37* 50* 50*  GFRAA 38*  --  49* 43* 57* 58*  ANIONGAP 24*  --  22* 17* 12 16*   < > = values in this interval not displayed.     Hematology Recent Labs  Lab 02/14/17 1500 02/14/17 1507 02/15/17 0231 02/15/17 0716 02/18/17 0841  WBC 7.7  --   --  6.7 7.7  RBC 5.08  --   --  4.54 4.70  HGB 15.4* 17.0*  --  13.5 14.0  HCT 49.9* 50.0* 42.1 43.9 44.8  MCV 98.2  --   --  96.7 95.3  MCH 30.3  --   --  29.7 29.8  MCHC 30.9  --   --  30.8 31.3  RDW 13.5  --   --  13.3 13.4  PLT 136*  --   --  120* 109*    Cardiac Enzymes Recent Labs  Lab 02/14/17 2108 02/15/17 0231 02/15/17 0716  TROPONINI <0.03 <0.03 <0.03    Recent Labs  Lab 02/14/17 1506  TROPIPOC 0.02     BNPNo results for input(s): BNP, PROBNP in the last 168 hours.   DDimer No results for input(s): DDIMER in the last 168 hours.   Radiology    No results found.  Cardiac Studies   2D echo 02/15/17 Study Conclusions  - Left ventricle: The cavity size was normal. Systolic function was   normal. The estimated ejection fraction was in the range of 55%   to 60%. Wall motion was normal; there were no regional wall   motion abnormalities. - Mitral valve: There was mild regurgitation. - Left atrium: The atrium was mildly dilated. - Atrial septum: There was increased thickness of the septum,   consistent with lipomatous hypertrophy. - Tricuspid valve: There was mild-moderate regurgitation. - Pulmonic valve: There was trivial regurgitation. - Pulmonary arteries: PA peak pressure: 44 mm Hg  (S). - Pericardium, extracardiac: A trivial pericardial effusion was   identified posterior to the heart.  Impressions:  - The right ventricular systolic pressure was increased consistent   with moderate pulmonary hypertension.  Patient Profile     84 y.o. 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 With MGUS and prior anemia, anticoagulation was cautiously started. Rhythm control not pursued in case she did not tolerate anticoagulation (Rate strategy). Patient did not show for f/u appt. Unfortunately patient was not taking her Xarelto 3 days prior to this admission and was admitted with recurrent stroke, admission also complicated by some paranoia features and dysarthria.   Assessment & Plan    1. Persistent atrial fibrillation - likely cause of her stroke in setting of noncompliance with Xarelto. Was on metoprolol 100mg  BID and diltiazem 180mg  as OP. Now only on metoprolol 25mg  TID. Will increase to 50mg  q8hours with probable goal of getting her back to 100mg  BID. There was some report of HR as low as 57 on 1/27 while on diltiazem but all of her recent HR have been higher. Hold off re-adding dilt, pending response to metoprolol.  2. HTN - follow BP with BB adjustment.   3. Stroke - per neurology. IM raising concern for some paranoia features which may further complicate therapies/prognosis. I recall speaking to family back in December and they had seen a general decline in the patient's functional ability going back several weeks at that time. I  wonder whether she has been developing some dementia along the way.  4. Hypokalemia - per IM. Consider adding standing repletion of potassium. Mg level has been normal.  5. Thrombocytopenia - will need to be followed in  setting of anticoagulation; further management per primary team.   For questions or updates, please contact [place] Please consult [email or web address] for contact info under Cardiology/STEMI.  Signed, Charlie Pitter, PA-C 02/19/2017, 11:30 AM     Attending Note:   The patient was seen and examined.  Agree with assessment and plan as noted above.  Changes made to the above note as needed.  Patient seen and independently examined with Melina Copa, PA .   We discussed all aspects of the encounter. I agree with the assessment and plan as stated above.  1.  Atrial fib:   Will gradually increase her metoprolol and Dilt as needed to maintain HR . Continue Xarelto    I have spent a total of 40 minutes with patient reviewing hospital  notes , telemetry, EKGs, labs and examining patient as well as establishing an assessment and plan that was discussed with the patient. > 50% of time was spent in direct patient care.    Thayer Headings, Brooke Bonito., MD, [facility] 02/19/2017, 6:38 PM [address]  [place] Pager [phone]

## 2017-02-20 ENCOUNTER — Encounter: Payer: Self-pay | Admitting: Physician Assistant

## 2017-02-20 LAB — PROTHROMBIN GENE MUTATION

## 2017-02-20 LAB — FACTOR 5 LEIDEN

## 2017-02-23 ENCOUNTER — Emergency Department (HOSPITAL_COMMUNITY): Payer: Medicare Other

## 2017-02-23 ENCOUNTER — Inpatient Hospital Stay (HOSPITAL_COMMUNITY)
Admission: EM | Admit: 2017-02-23 | Discharge: 2017-02-25 | DRG: 065 | Disposition: A | Discharge status: Hospice | Payer: Medicare Other | Attending: Family Medicine | Admitting: Family Medicine

## 2017-02-23 ENCOUNTER — Encounter (HOSPITAL_COMMUNITY): Payer: Self-pay | Admitting: Pharmacy Technician

## 2017-02-23 DIAGNOSIS — R1310 Dysphagia, unspecified: Secondary | ICD-10-CM | POA: Diagnosis present

## 2017-02-23 DIAGNOSIS — M889 Osteitis deformans of unspecified bone: Secondary | ICD-10-CM | POA: Diagnosis present

## 2017-02-23 DIAGNOSIS — E785 Hyperlipidemia, unspecified: Secondary | ICD-10-CM | POA: Diagnosis present

## 2017-02-23 DIAGNOSIS — I63513 Cerebral infarction due to unspecified occlusion or stenosis of bilateral middle cerebral arteries: Secondary | ICD-10-CM | POA: Diagnosis present

## 2017-02-23 DIAGNOSIS — I129 Hypertensive chronic kidney disease with stage 1 through stage 4 chronic kidney disease, or unspecified chronic kidney disease: Secondary | ICD-10-CM | POA: Diagnosis present

## 2017-02-23 DIAGNOSIS — I69320 Aphasia following cerebral infarction: Secondary | ICD-10-CM

## 2017-02-23 DIAGNOSIS — Z7901 Long term (current) use of anticoagulants: Secondary | ICD-10-CM | POA: Diagnosis not present

## 2017-02-23 DIAGNOSIS — Z993 Dependence on wheelchair: Secondary | ICD-10-CM

## 2017-02-23 DIAGNOSIS — M4800 Spinal stenosis, site unspecified: Secondary | ICD-10-CM | POA: Diagnosis present

## 2017-02-23 DIAGNOSIS — Z90711 Acquired absence of uterus with remaining cervical stump: Secondary | ICD-10-CM

## 2017-02-23 DIAGNOSIS — I639 Cerebral infarction, unspecified: Secondary | ICD-10-CM | POA: Diagnosis not present

## 2017-02-23 DIAGNOSIS — R29810 Facial weakness: Secondary | ICD-10-CM | POA: Diagnosis present

## 2017-02-23 DIAGNOSIS — G8194 Hemiplegia, unspecified affecting left nondominant side: Secondary | ICD-10-CM | POA: Diagnosis present

## 2017-02-23 DIAGNOSIS — I4891 Unspecified atrial fibrillation: Secondary | ICD-10-CM

## 2017-02-23 DIAGNOSIS — Z7189 Other specified counseling: Secondary | ICD-10-CM

## 2017-02-23 DIAGNOSIS — Z853 Personal history of malignant neoplasm of breast: Secondary | ICD-10-CM | POA: Diagnosis not present

## 2017-02-23 DIAGNOSIS — I63511 Cerebral infarction due to unspecified occlusion or stenosis of right middle cerebral artery: Secondary | ICD-10-CM | POA: Diagnosis not present

## 2017-02-23 DIAGNOSIS — E1122 Type 2 diabetes mellitus with diabetic chronic kidney disease: Secondary | ICD-10-CM | POA: Diagnosis present

## 2017-02-23 DIAGNOSIS — Z515 Encounter for palliative care: Secondary | ICD-10-CM | POA: Diagnosis not present

## 2017-02-23 DIAGNOSIS — Z66 Do not resuscitate: Secondary | ICD-10-CM | POA: Diagnosis present

## 2017-02-23 DIAGNOSIS — R471 Dysarthria and anarthria: Secondary | ICD-10-CM | POA: Diagnosis present

## 2017-02-23 DIAGNOSIS — E1151 Type 2 diabetes mellitus with diabetic peripheral angiopathy without gangrene: Secondary | ICD-10-CM | POA: Diagnosis present

## 2017-02-23 DIAGNOSIS — Z9049 Acquired absence of other specified parts of digestive tract: Secondary | ICD-10-CM

## 2017-02-23 DIAGNOSIS — Z9114 Patient's other noncompliance with medication regimen: Secondary | ICD-10-CM

## 2017-02-23 DIAGNOSIS — N183 Chronic kidney disease, stage 3 (moderate): Secondary | ICD-10-CM | POA: Diagnosis present

## 2017-02-23 DIAGNOSIS — I481 Persistent atrial fibrillation: Secondary | ICD-10-CM | POA: Diagnosis present

## 2017-02-23 DIAGNOSIS — Z79899 Other long term (current) drug therapy: Secondary | ICD-10-CM | POA: Diagnosis not present

## 2017-02-23 DIAGNOSIS — R29727 NIHSS score 27: Secondary | ICD-10-CM | POA: Diagnosis present

## 2017-02-23 LAB — COMPREHENSIVE METABOLIC PANEL
ALK PHOS: 90 U/L (ref 38–126)
ALT: 11 U/L — AB (ref 14–54)
AST: 18 U/L (ref 15–41)
Albumin: 3.3 g/dL — ABNORMAL LOW (ref 3.5–5.0)
Anion gap: 18 — ABNORMAL HIGH (ref 5–15)
BUN: 18 mg/dL (ref 6–20)
CALCIUM: 9.2 mg/dL (ref 8.9–10.3)
CO2: 17 mmol/L — ABNORMAL LOW (ref 22–32)
Chloride: 104 mmol/L (ref 101–111)
Creatinine, Ser: 1.28 mg/dL — ABNORMAL HIGH (ref 0.44–1.00)
GFR, EST AFRICAN AMERICAN: 44 mL/min — AB (ref >60)
GFR, EST NON AFRICAN AMERICAN: 38 mL/min — AB (ref >60)
Glucose, Bld: 153 mg/dL — ABNORMAL HIGH (ref 65–99)
Potassium: 3.7 mmol/L (ref 3.5–5.1)
Sodium: 139 mmol/L (ref 135–145)
TOTAL PROTEIN: 6.5 g/dL (ref 6.5–8.1)
Total Bilirubin: 1 mg/dL (ref 0.3–1.2)

## 2017-02-23 LAB — CBC
HEMATOCRIT: 45.9 % (ref 36.0–46.0)
Hemoglobin: 14.4 g/dL (ref 12.0–15.0)
MCH: 30.1 pg (ref 26.0–34.0)
MCHC: 31.4 g/dL (ref 30.0–36.0)
MCV: 95.8 fL (ref 78.0–100.0)
Platelets: 139 10*3/uL — ABNORMAL LOW (ref 150–400)
RBC: 4.79 MIL/uL (ref 3.87–5.11)
RDW: 14 % (ref 11.5–15.5)
WBC: 8.7 10*3/uL (ref 4.0–10.5)

## 2017-02-23 LAB — CBG MONITORING, ED: Glucose-Capillary: 126 mg/dL — ABNORMAL HIGH (ref 65–99)

## 2017-02-23 LAB — DIFFERENTIAL
Basophils Absolute: 0 10*3/uL (ref 0.0–0.1)
Basophils Relative: 0 %
EOS PCT: 0 %
Eosinophils Absolute: 0 10*3/uL (ref 0.0–0.7)
LYMPHS PCT: 11 %
Lymphs Abs: 0.9 10*3/uL (ref 0.7–4.0)
MONO ABS: 0.7 10*3/uL (ref 0.1–1.0)
MONOS PCT: 8 %
NEUTROS ABS: 7 10*3/uL (ref 1.7–7.7)
Neutrophils Relative %: 81 %

## 2017-02-23 LAB — I-STAT CHEM 8, ED
BUN: 23 mg/dL — ABNORMAL HIGH (ref 6–20)
Calcium, Ion: 1.11 mmol/L — ABNORMAL LOW (ref 1.15–1.40)
Chloride: 103 mmol/L (ref 101–111)
Creatinine, Ser: 1.2 mg/dL — ABNORMAL HIGH (ref 0.44–1.00)
Glucose, Bld: 132 mg/dL — ABNORMAL HIGH (ref 65–99)
HCT: 49 % — ABNORMAL HIGH (ref 36.0–46.0)
Hemoglobin: 16.7 g/dL — ABNORMAL HIGH (ref 12.0–15.0)
Potassium: 3.5 mmol/L (ref 3.5–5.1)
Sodium: 142 mmol/L (ref 135–145)
TCO2: 27 mmol/L (ref 22–32)

## 2017-02-23 LAB — APTT: aPTT: 30 seconds (ref 24–36)

## 2017-02-23 LAB — I-STAT TROPONIN, ED: TROPONIN I, POC: 0 ng/mL (ref 0.00–0.08)

## 2017-02-23 LAB — PROTIME-INR
INR: 1.12
Prothrombin Time: 14.3 seconds (ref 11.4–15.2)

## 2017-02-23 MED ORDER — SODIUM CHLORIDE 0.9 % IV BOLUS (SEPSIS)
1000.0000 mL | Freq: Once | INTRAVENOUS | Status: AC
  Administered 2017-02-23: 1000 mL via INTRAVENOUS

## 2017-02-23 MED ORDER — DILTIAZEM LOAD VIA INFUSION
10.0000 mg | Freq: Once | INTRAVENOUS | Status: AC
Start: 2017-02-23 — End: 2017-02-23
  Administered 2017-02-23: 10 mg via INTRAVENOUS
  Filled 2017-02-23: qty 10

## 2017-02-23 MED ORDER — DILTIAZEM HCL 25 MG/5ML IV SOLN
15.0000 mg | Freq: Once | INTRAVENOUS | Status: AC
  Administered 2017-02-23: 15 mg via INTRAVENOUS

## 2017-02-23 MED ORDER — IOPAMIDOL (ISOVUE-370) INJECTION 76%
INTRAVENOUS | Status: AC
  Filled 2017-02-23: qty 50

## 2017-02-23 MED ORDER — DILTIAZEM HCL-DEXTROSE 100-5 MG/100ML-% IV SOLN (PREMIX)
5.0000 mg/h | INTRAVENOUS | Status: DC
  Administered 2017-02-23: 5 mg/h via INTRAVENOUS
  Administered 2017-02-24: 15 mg/h via INTRAVENOUS
  Filled 2017-02-23 (×4): qty 100

## 2017-02-23 MED ORDER — HYDRALAZINE HCL 20 MG/ML IJ SOLN: 5.0000 mg | INTRAMUSCULAR | Status: DC | PRN

## 2017-02-23 NOTE — ED Triage Notes (Signed)
Pt arrives via ems from [facility] as a code stroke. Pt LKW 1300. arppox 1500 pt found to be confused by staff. Pt with L sided weakness and facial droop as well as aphasia and R sided gaze. PERRLA intact. Pt with ischemic stroke last month. Pt also with HR of 140-180. BP 170/120.

## 2017-02-23 NOTE — H&P (Signed)
History and Physical    Kendra Maldonado:[id number] DOB: May 10, 1933 DOA: 02/23/2017  Referring MD/NP/PA:Dr. Myrene Buddy PCP: Lavone Orn, MD  Patient coming from: SNF via EMS  Chief Complaint: Fall  I have personally briefly reviewed patient's old medical records in [place]   HPI: Kendra Maldonado is a 84 y.o. female with medical history significant of PAF, DM type II, CKD stage III, CVA on Xarelto, Paget's disease, history of breast cancer, MGUS; who presented after fall with left-sided deficit.  History is obtained from review of records and the patient's sons were present at bedside.  Patient had just been hospitalized from 1/25-1/30 after being found to have a acute infarct of the lateral left frontal lobe on 1/26 by MRI.  Family notes that she had residual dysarthria when discharged to the skilled nursing facility that was starting to improve to the point in which she could almost make complete sentences.  They had been at the nursing facility earlier today around 1 PM , and she was noted to be going better.  At baseline patient is mostly wheelchair-bound.  However, sometime thereafter between 3-4:30pm the patient had been reported to be altered. Then fell out of her wheelchair hitting her head on the floor.  EMS noted left sided weakness and a rightward eye gaze on arrival.  Family notes that the patient's overall health has been declining and he had been losing weight with poor p.o. intake since last year.  Family notes that patient is a DO NOT RESUSCITATE and does not wish to have feeding tubes are life-sustaining measures.  ED Course: Patient presented as a code stroke with EMS seen by neurology.  CT showing no hemorrhage.  Thought to have had significant right MCA stroke resulting in left hemiparesis and rightward gaze deviation.  Not a candidate for TPA.  No further workup was recommended.  She was found to be in atrial fibrillation with RVR 100 bpm on admission for which diltiazem  drip was started.  TRH called to admit.  Review of Systems  Unable to perform ROS: Mental status change  Musculoskeletal: Positive for falls.    Past Medical History:  Diagnosis Date  . Back pain   . Breast cancer, left breast ([place]) 2010   lumpectomy  . Cerebrovascular disease    a. 03/2015 - high grade proximal right posterior inferior cerebellar artery stenosis seen on MRA corresponding with distribution of stroke at that time.  . Chronic diarrhea   . CKD (chronic kidney disease), stage III ([place])   . Diabetes mellitus type 2, uncontrolled ([place])   . Essential hypertension   . Hiatal hernia    Stenosis of the spine  . Iron deficiency anemia 01/02/2017  . Lung nodule   . MGUS (monoclonal gammopathy of unknown significance)   . Paget's disease of bone   . Persistent atrial fibrillation ([place])    a. dx 12/2016.  Marland Kitchen Spinal stenosis   . Stroke ([place]) 03/2015  . Symptomatic anemia   . Upper GI bleed 11/2013    Past Surgical History:  Procedure Laterality Date  . BREAST SURGERY    . CHOLECYSTECTOMY    . ESOPHAGOGASTRODUODENOSCOPY (EGD) WITH PROPOFOL N/A 12/10/2013   Procedure: ESOPHAGOGASTRODUODENOSCOPY (EGD) WITH PROPOFOL;  Surgeon: Jeryl Columbia, MD;  Location: [facility] ENDOSCOPY;  Service: Endoscopy;  Laterality: N/A;  . HEMORRHOID SURGERY    . PARTIAL HYSTERECTOMY       reports that  has never smoked. she has never used smokeless tobacco.  She reports that she does not drink alcohol or use drugs.  Allergies  Allergen Reactions  . Tramadol Other (See Comments)    dizziness, "WENT OUT"  . Codeine Rash  . Demerol [Meperidine] Rash    Family History  Problem Relation Age of Onset  . Hypertension Mother   . Dementia Father   . Cancer Brother        pancreas    Prior to Admission medications   Medication Sig Start Date End Date Taking? Authorizing Provider  atorvastatin (LIPITOR) 40 MG tablet Take 1 tablet (40 mg total) by mouth daily at 6 PM. 04/06/15  Yes Hosie Poisson, MD    Cholecalciferol (D 2000) 2000 UNITS TABS Take 2,000 Units by mouth daily.   Yes [provider]  metoprolol tartrate (LOPRESSOR) 50 MG tablet Take 1 tablet (50 mg total) by mouth every 8 (eight) hours. 02/19/17  Yes Dana Allan I, MD  mirtazapine (REMERON) 15 MG tablet Take 15 mg by mouth at bedtime.   Yes [provider]  omeprazole (PRILOSEC) 20 MG capsule Take 1 capsule (20 mg total) by mouth 2 (two) times daily before a meal. Patient taking differently: Take 20 mg by mouth 2 (two) times daily.  01/09/17  Yes Domenic Polite, MD  potassium chloride 20 MEQ TBCR Take 10 mEq by mouth 2 (two) times daily for 7 days. Patient taking differently: Take 20 mEq by mouth See admin instructions. Take 1 tablet (20 meq) by mouth twice daily from 02/19/17 to 02/26/17 02/19/17 02/26/17 Yes Ogbata, Babs Bertin, MD  QUEtiapine (SEROQUEL) 25 MG tablet Take 0.5 tablets (12.5 mg total) by mouth at bedtime. 02/19/17  Yes Bonnell Public, MD  mirtazapine (REMERON SOL-TAB) 15 MG disintegrating tablet Take 1 tablet (15 mg total) by mouth at bedtime. Patient not taking: Reported on 02/23/2017 02/19/17   Bonnell Public, MD  Rivaroxaban (XARELTO) 15 MG TABS tablet Take 1 tablet (15 mg total) by mouth daily with supper. Patient not taking: Reported on 02/23/2017 01/09/17   Domenic Polite, MD    Physical Exam:  Constitutional: Elderly female who does not really follow commands and babbling Vitals:   02/23/17 1830 02/23/17 1900 02/23/17 1930 02/23/17 2145  BP: (!) 175/129 (!) 144/97 128/84 (!) 184/119  Pulse: (!) 163 (!) 140 (!) 143 (!) 117  Resp: (!) 26 (!) 24 (!) 29 (!) 23  SpO2: 96% 95% 96% 95%   Eyes: Right-ward gaze ENMT: Cut to the right side patient's mouth with dried blood present Neck: normal, supple, no masses, no thyromegaly Respiratory: Tachypneic Cardiovascular: Irregular irregular, no murmurs / rubs / gallops. No extremity edema. 2+ pedal pulses. No carotid bruits.  Abdomen: no  tenderness, no masses palpated. No hepatosplenomegaly. Bowel sounds positive.  Musculoskeletal: no clubbing / cyanosis. No joint deformity upper and lower extremities. Good ROM, no contractures. Normal muscle tone on the right side.  Skin: no rashes, lesions, ulcers. No induration Neurologic: CN 2-12 grossly intact. Strength 5/5 on the right upper and lower extremity. Psychiatric: Unable to assess.    Labs on Admission: I have personally reviewed following labs and imaging studies  CBC: Recent Labs  Lab 02/18/17 0841 02/23/17 1749 02/23/17 1936  WBC 7.7  --  8.7  NEUTROABS 5.4  --  7.0  HGB 14.0 16.7* 14.4  HCT 44.8 49.0* 45.9  MCV 95.3  --  95.8  PLT 109*  --  644*   Basic Metabolic Panel: Recent Labs  Lab 02/18/17 0841 02/19/17 0907 02/23/17  1749 02/23/17 1936  NA 142 141 142 139  K 2.6* 3.5 3.5 3.7  CL 102 105 103 104  CO2 28 20*  --  17*  GLUCOSE 97 86 132* 153*  BUN 12 15 23* 18  CREATININE 1.03* 1.02* 1.20* 1.28*  CALCIUM 8.5* 8.6*  --  9.2  PHOS 3.2  --   --   --    GFR: Estimated Creatinine Clearance: 33.5 mL/min (A) (by C-G formula based on SCr of 1.28 mg/dL (H)). Liver Function Tests: Recent Labs  Lab 02/18/17 0841 02/23/17 1936  AST  --  18  ALT  --  11*  ALKPHOS  --  90  BILITOT  --  1.0  PROT  --  6.5  ALBUMIN 2.8* 3.3*   No results for input(s): LIPASE, AMYLASE in the last 168 hours. No results for input(s): AMMONIA in the last 168 hours. Coagulation Profile: Recent Labs  Lab 02/23/17 1936  INR 1.12   Cardiac Enzymes: No results for input(s): CKTOTAL, CKMB, CKMBINDEX, TROPONINI in the last 168 hours. BNP (last 3 results) No results for input(s): PROBNP in the last 8760 hours. HbA1C: No results for input(s): HGBA1C in the last 72 hours. CBG: Recent Labs  Lab 02/19/17 1857 02/23/17 1733  GLUCAP 144* 126*   Lipid Profile: No results for input(s): CHOL, HDL, LDLCALC, TRIG, CHOLHDL, LDLDIRECT in the last 72 hours. Thyroid Function  Tests: No results for input(s): TSH, T4TOTAL, FREET4, T3FREE, THYROIDAB in the last 72 hours. Anemia Panel: No results for input(s): VITAMINB12, FOLATE, FERRITIN, TIBC, IRON, RETICCTPCT in the last 72 hours. Urine analysis:    Component Value Date/Time   COLORURINE YELLOW 02/18/2017 1917   APPEARANCEUR CLEAR 02/18/2017 1917   LABSPEC 1.013 02/18/2017 1917   PHURINE 6.0 02/18/2017 1917   GLUCOSEU NEGATIVE 02/18/2017 [place] NEGATIVE 02/18/2017 [place] NEGATIVE 02/18/2017 [place] NEGATIVE 02/18/2017 1917   PROTEINUR 30 (A) 02/18/2017 1917   UROBILINOGEN 0.2 03/12/2007 1413   NITRITE NEGATIVE 02/18/2017 1917   LEUKOCYTESUR SMALL (A) 02/18/2017 1917   Sepsis Labs: Recent Results (from the past 240 hour(s))  MRSA PCR Screening     Status: None   Collection Time: 02/15/17  2:14 AM  Result Value Ref Range Status   MRSA by PCR NEGATIVE NEGATIVE Final    Comment:        The GeneXpert MRSA Assay (FDA approved for NASAL specimens only), is one component of a comprehensive MRSA colonization surveillance program. It is not intended to diagnose MRSA infection nor to guide or monitor treatment for MRSA infections.      Radiological Exams on Admission: Ct Cervical Spine Wo Contrast  Result Date: 02/23/2017 CLINICAL DATA:  Patient slipped out of wheelchair and struck head. No neck pain is reported. EXAM: CT CERVICAL SPINE WITHOUT CONTRAST TECHNIQUE: Multidetector CT imaging of the cervical spine was performed without intravenous contrast. Multiplanar CT image reconstructions were also generated. COMPARISON:  CT head reported separately. FINDINGS: Multiple repeats were obtained, but despite best efforts of the technologist, there is significant motion degradation. The study is of marginal diagnostic utility. Alignment: Anatomic. Skull base and vertebrae: Motion degraded, no definite fracture. Soft tissues and spinal canal: No prevertebral fluid or swelling. Assessment  for canal hematoma not possible. Disc levels: Multilevel disc space narrowing, most pronounced at C4-5 and C5-6. Upper chest: No pneumothorax or visible mass. Other: None. IMPRESSION: Motion degraded examination of marginal diagnostic utility. No gross cervical spine fracture or  traumatic subluxation. Electronically Signed   By: Staci Righter M.D.   On: 02/23/2017 18:16   Ct Head Code Stroke Wo Contrast  Result Date: 02/23/2017 CLINICAL DATA:  Code stroke. LEFT-sided weakness. Patient slipped out of wheelchair and struck head EXAM: CT HEAD WITHOUT CONTRAST TECHNIQUE: Contiguous axial images were obtained from the base of the skull through the vertex without intravenous contrast. COMPARISON:  CT head 02/14/2017. MR brain 02/15/2017. CT cervical spine reported separately. FINDINGS: Brain: There is a subacute infarct affecting the LEFT frontal lobe without hemorrhage, identified on prior MR there is no acute stroke, acute hemorrhage, mass lesion, hydrocephalus, or extra-axial fluid there is advanced atrophy with extensive hypoattenuation of the white matter. Chronic BILATERAL cerebellar infarcts are observed. Vascular: Calcification of the cavernous internal carotid arteries consistent with cerebrovascular atherosclerotic disease. No signs of intracranial large vessel occlusion. Skull: Normal. Negative for fracture or focal lesion. Sinuses/Orbits: No acute finding. Other: None. ASPECTS [facility] Stroke Program Early CT Score) - Ganglionic level infarction (caudate, lentiform nuclei, internal capsule, insula, M1-M3 cortex): 7 - Supraganglionic infarction (M4-M6 cortex): 3 (allowing for previously identified infarct). Total score (0-10 with 10 being normal): 10 IMPRESSION: 1. Atrophy and small vessel disease. Subacute LEFT frontal infarct is nonhemorrhagic. No acute intracranial findings. No posttraumatic sequelae are evident. 2. ASPECTS is 10. These results were communicated to [place] [facility] [place] 2/3/2019by text page  via the [facility] messaging system. Electronically Signed   By: Staci Righter M.D.   On: 02/23/2017 18:13    EKG: Independently reviewed.  Atrial fibrillation at 180 bpm  Assessment/Plan Right MCA stroke with left hemiparesis and rightward gaze: Acute.  Patient presents from nursing home after fall with new left-sided hemiparesis and rightward gaze suggestive of acute right MCA stroke. - Admit to stepdown bed - Neurochecks - goal permissive HTN at 938/182 systolic - Aspiration precautions - speech eval - Consider aspirin and statin if able to pass swallow - Palliative care consult - Social work consult for placement  Atrial fibrillation with RVR - Continue diltiazem drip - Hold Xarelto due to significant stroke  DVT prophylaxis: scds Code Status:DNR Family Communication: Discussed plan of care with the patient and family present at bedside Disposition Plan: To be determined Consults called: Neurology Admission status:inpatient  Norval Morton MD [facility] Pager [phone]   If 7PM-7AM, please contact night-coverage [email or web address] Password [email or web address]  02/23/2017, 11:00 PM

## 2017-02-23 NOTE — ED Provider Notes (Addendum)
[facility] EMERGENCY DEPARTMENT Provider Note   CSN: [id number] Arrival date & time: 02/23/17  1730   An emergency department physician performed an initial assessment on this suspected stroke patient at 1732.  History   Chief Complaint Chief Complaint  Patient presents with  . Code Stroke    HPI Kendra Maldonado is a 84 y.o. female.  HPI   84 year old female with extensive past medical history as below here with altered mental status.  The patient was just hospitalized for an acute MCA stroke and was discharged on Xarelto.  It is unclear whether she actually was started on her Xarelto at her facility.  Just prior to arrival, the patient reportedly fell forward in her chair.  She had been speaking occasionally at the time prior to this, but now is no longer able to speak and has profound left-sided paralysis.  This is all worse than it previously was.  EMS was called and the patient was brought in as a code stroke.  On my assessment, patient unable to provide history due to aphasia.  Level 5 caveat invoked as remainder of history, ROS, and physical exam limited due to patient's AMS.   Past Medical History:  Diagnosis Date  . Back pain   . Breast cancer, left breast ([place]) 2010   lumpectomy  . Cerebrovascular disease    a. 03/2015 - high grade proximal right posterior inferior cerebellar artery stenosis seen on MRA corresponding with distribution of stroke at that time.  . Chronic diarrhea   . CKD (chronic kidney disease), stage III ([place])   . Diabetes mellitus type 2, uncontrolled ([place])   . Essential hypertension   . Hiatal hernia    Stenosis of the spine  . Iron deficiency anemia 01/02/2017  . Lung nodule   . MGUS (monoclonal gammopathy of unknown significance)   . Paget's disease of bone   . Persistent atrial fibrillation ([place])    a. dx 12/2016.  Marland Kitchen Spinal stenosis   . Stroke ([place]) 03/2015  . Symptomatic anemia   . Upper GI bleed 11/2013    Patient  Active Problem List   Diagnosis Date Noted  . Non-compliance   . Pulmonary hypertension ([place])   . Tricuspid valve insufficiency   . Diabetes mellitus type 2 in nonobese (HCC)   . Benign essential HTN   . Polycythemia vera ([place])   . Stage 3 chronic kidney disease ([place])   . Depression   . History of stroke   . Polycythemia 02/14/2017  . Hypokalemia 02/14/2017  . Persistent atrial fibrillation ([place]) 01/27/2017  . Spinal stenosis   . Paget's disease of bone   . Lung nodule   . Essential hypertension   . Hiatal hernia   . Diabetes mellitus type 2, uncontrolled ([place])   . CKD (chronic kidney disease), stage II   . Cerebrovascular disease   . Dysphagia 01/06/2017  . Iron deficiency anemia 01/02/2017  . Atrial fibrillation with rapid ventricular response ([place])   . Diarrhea 12/31/2016  . Rapid atrial fibrillation ([place]) 12/31/2016  . MGUS (monoclonal gammopathy of unknown significance) 05/15/2016  . HLD (hyperlipidemia) 04/05/2015  . Cerebrovascular accident (CVA) due to occlusion of right cerebellar artery ([place])   . Stroke ([place]) 04/04/2015  . Cerebellar stroke, acute ([place]) 04/04/2015  . Hypertension 04/04/2015  . Stroke (cerebrum) ([place]) 04/04/2015  . Type 2 diabetes mellitus with vascular disease ([place]) 04/04/2015  . Anemia due to GI blood loss   . Symptomatic anemia   .  Acute posthemorrhagic anemia 12/09/2013  . Upper GI bleed 12/09/2013  . Back pain 12/09/2013  . Chronic diarrhea 12/09/2013  . Breast cancer, left breast ([place]) 01/22/2008    Past Surgical History:  Procedure Laterality Date  . BREAST SURGERY    . CHOLECYSTECTOMY    . ESOPHAGOGASTRODUODENOSCOPY (EGD) WITH PROPOFOL N/A 12/10/2013   Procedure: ESOPHAGOGASTRODUODENOSCOPY (EGD) WITH PROPOFOL;  Surgeon: Jeryl Columbia, MD;  Location: [facility] ENDOSCOPY;  Service: Endoscopy;  Laterality: N/A;  . HEMORRHOID SURGERY    . PARTIAL HYSTERECTOMY      OB History    No data available       Home Medications    Prior to  Admission medications   Medication Sig Start Date End Date Taking? Authorizing Provider  atorvastatin (LIPITOR) 40 MG tablet Take 1 tablet (40 mg total) by mouth daily at 6 PM. 04/06/15  Yes Hosie Poisson, MD  Cholecalciferol (D 2000) 2000 UNITS TABS Take 2,000 Units by mouth daily.   Yes [provider]  metoprolol tartrate (LOPRESSOR) 50 MG tablet Take 1 tablet (50 mg total) by mouth every 8 (eight) hours. 02/19/17  Yes Dana Allan I, MD  mirtazapine (REMERON) 15 MG tablet Take 15 mg by mouth at bedtime.   Yes [provider]  omeprazole (PRILOSEC) 20 MG capsule Take 1 capsule (20 mg total) by mouth 2 (two) times daily before a meal. Patient taking differently: Take 20 mg by mouth 2 (two) times daily.  01/09/17  Yes Domenic Polite, MD  potassium chloride 20 MEQ TBCR Take 10 mEq by mouth 2 (two) times daily for 7 days. Patient taking differently: Take 20 mEq by mouth See admin instructions. Take 1 tablet (20 meq) by mouth twice daily from 02/19/17 to 02/26/17 02/19/17 02/26/17 Yes Ogbata, Babs Bertin, MD  QUEtiapine (SEROQUEL) 25 MG tablet Take 0.5 tablets (12.5 mg total) by mouth at bedtime. 02/19/17  Yes Bonnell Public, MD  mirtazapine (REMERON SOL-TAB) 15 MG disintegrating tablet Take 1 tablet (15 mg total) by mouth at bedtime. Patient not taking: Reported on 02/23/2017 02/19/17   Bonnell Public, MD  Rivaroxaban (XARELTO) 15 MG TABS tablet Take 1 tablet (15 mg total) by mouth daily with supper. Patient not taking: Reported on 02/23/2017 01/09/17   Domenic Polite, MD    Family History Family History  Problem Relation Age of Onset  . Hypertension Mother   . Dementia Father   . Cancer Brother        pancreas    Social History Social History   Tobacco Use  . Smoking status: Never Smoker  . Smokeless tobacco: Never Used  Substance Use Topics  . Alcohol use: No  . Drug use: No     Allergies   Tramadol; Codeine; and Demerol [meperidine]   Review of  Systems Review of Systems  Unable to perform ROS: Mental status change     Physical Exam Updated Vital Signs BP (!) 154/91   Pulse (!) 53   Resp (!) 23   SpO2 95%   Physical Exam  Constitutional: She appears well-developed and well-nourished. She appears distressed.  HENT:  Head: Normocephalic and atraumatic.  Mildly dry MM  Eyes: Conjunctivae are normal.  Neck: Neck supple.  Cardiovascular: Normal heart sounds. An irregularly irregular rhythm present. Tachycardia present. Exam reveals no friction rub.  No murmur heard. Pulmonary/Chest: Effort normal and breath sounds normal. Tachypnea noted. No respiratory distress. She has no wheezes. She has no rales.  Abdominal: She exhibits no distension.  Musculoskeletal: She exhibits no edema.  Neurological: She exhibits normal muscle tone.  Disoriented.  Moaning.  Dysarthric.  Rightward gaze deviation.  Hemiparesis to the left arm and leg.  Moving right arm and leg spontaneously.  Withdraws to painful stimuli on the right.  No response to painful stimuli on the left.  Skin: Skin is warm. Capillary refill takes less than 2 seconds.  Psychiatric: She has a normal mood and affect.  Nursing note and vitals reviewed.    ED Treatments / Results  Labs (all labs ordered are listed, but only abnormal results are displayed) Labs Reviewed  CBC - Abnormal; Notable for the following components:      Result Value   Platelets 139 (*)    All other components within normal limits  COMPREHENSIVE METABOLIC PANEL - Abnormal; Notable for the following components:   CO2 17 (*)    Glucose, Bld 153 (*)    Creatinine, Ser 1.28 (*)    Albumin 3.3 (*)    ALT 11 (*)    GFR calc non Af Amer 38 (*)    GFR calc Af Amer 44 (*)    Anion gap 18 (*)    All other components within normal limits  CBG MONITORING, ED - Abnormal; Notable for the following components:   Glucose-Capillary 126 (*)    All other components within normal limits  I-STAT CHEM 8, ED -  Abnormal; Notable for the following components:   BUN 23 (*)    Creatinine, Ser 1.20 (*)    Glucose, Bld 132 (*)    Calcium, Ion 1.11 (*)    Hemoglobin 16.7 (*)    HCT 49.0 (*)    All other components within normal limits  PROTIME-INR  APTT  DIFFERENTIAL  I-STAT TROPONIN, ED    EKG  EKG Interpretation  Date/Time:  Sunday February 23 2017 18:26:13 EST Ventricular Rate:  180 PR Interval:    QRS Duration: 96 QT Interval:  261 QTC Calculation: 452 R Axis:   101 Text Interpretation:  Atrial fibrillation with rapid V-rate Right axis deviation Borderline low voltage, extremity leads Repolarization abnormality, prob rate related Baseline wander in lead(s) I III aVL No significant change since last tracing Confirmed by Duffy Bruce [phone]) on 02/23/2017 6:40:31 PM       Radiology Ct Cervical Spine Wo Contrast  Result Date: 02/23/2017 CLINICAL DATA:  Patient slipped out of wheelchair and struck head. No neck pain is reported. EXAM: CT CERVICAL SPINE WITHOUT CONTRAST TECHNIQUE: Multidetector CT imaging of the cervical spine was performed without intravenous contrast. Multiplanar CT image reconstructions were also generated. COMPARISON:  CT head reported separately. FINDINGS: Multiple repeats were obtained, but despite best efforts of the technologist, there is significant motion degradation. The study is of marginal diagnostic utility. Alignment: Anatomic. Skull base and vertebrae: Motion degraded, no definite fracture. Soft tissues and spinal canal: No prevertebral fluid or swelling. Assessment for canal hematoma not possible. Disc levels: Multilevel disc space narrowing, most pronounced at C4-5 and C5-6. Upper chest: No pneumothorax or visible mass. Other: None. IMPRESSION: Motion degraded examination of marginal diagnostic utility. No gross cervical spine fracture or traumatic subluxation. Electronically Signed   By: Staci Righter M.D.   On: 02/23/2017 18:16   Ct Head Code Stroke Wo  Contrast  Result Date: 02/23/2017 CLINICAL DATA:  Code stroke. LEFT-sided weakness. Patient slipped out of wheelchair and struck head EXAM: CT HEAD WITHOUT CONTRAST TECHNIQUE: Contiguous axial images were obtained from the base of the skull through  the vertex without intravenous contrast. COMPARISON:  CT head 02/14/2017. MR brain 02/15/2017. CT cervical spine reported separately. FINDINGS: Brain: There is a subacute infarct affecting the LEFT frontal lobe without hemorrhage, identified on prior MR there is no acute stroke, acute hemorrhage, mass lesion, hydrocephalus, or extra-axial fluid there is advanced atrophy with extensive hypoattenuation of the white matter. Chronic BILATERAL cerebellar infarcts are observed. Vascular: Calcification of the cavernous internal carotid arteries consistent with cerebrovascular atherosclerotic disease. No signs of intracranial large vessel occlusion. Skull: Normal. Negative for fracture or focal lesion. Sinuses/Orbits: No acute finding. Other: None. ASPECTS [facility] Stroke Program Early CT Score) - Ganglionic level infarction (caudate, lentiform nuclei, internal capsule, insula, M1-M3 cortex): 7 - Supraganglionic infarction (M4-M6 cortex): 3 (allowing for previously identified infarct). Total score (0-10 with 10 being normal): 10 IMPRESSION: 1. Atrophy and small vessel disease. Subacute LEFT frontal infarct is nonhemorrhagic. No acute intracranial findings. No posttraumatic sequelae are evident. 2. ASPECTS is 10. These results were communicated to [place] [facility] [place] 2/3/2019by text page via the [facility] messaging system. Electronically Signed   By: Staci Righter M.D.   On: 02/23/2017 18:13    Procedures .Critical Care Performed by: Duffy Bruce, MD Authorized by: Duffy Bruce, MD   Critical care provider statement:    Critical care time (minutes):  35   Critical care time was exclusive of:  Separately billable procedures and treating other patients and teaching  time   Critical care was necessary to treat or prevent imminent or life-threatening deterioration of the following conditions:  Circulatory failure, CNS failure or compromise and dehydration   Critical care was time spent personally by me on the following activities:  Development of treatment plan with patient or surrogate, discussions with consultants, evaluation of patient's response to treatment, examination of patient, obtaining history from patient or surrogate, ordering and performing treatments and interventions, ordering and review of laboratory studies, ordering and review of radiographic studies, pulse oximetry, re-evaluation of patient's condition and review of old charts   I assumed direction of critical care for this patient from another provider in my specialty: no     (including critical care time)  Medications Ordered in ED Medications  iopamidol (ISOVUE-370) 76 % injection (not administered)  diltiazem (CARDIZEM) 1 mg/mL load via infusion 10 mg (10 mg Intravenous Bolus from Bag 02/23/17 1846)    And  diltiazem (CARDIZEM) 100 mg in dextrose 5% 150mL (1 mg/mL) infusion (15 mg/hr Intravenous Rate/Dose Change 02/23/17 2120)  sodium chloride 0.9 % bolus 1,000 mL (1,000 mLs Intravenous New Bag/Given 02/23/17 2306)  diltiazem (CARDIZEM) injection 15 mg (15 mg Intravenous Given 02/23/17 2120)     Initial Impression / Assessment and Plan / ED Course  I have reviewed the triage vital signs and the nursing notes.  Pertinent labs & imaging results that were available during my care of the patient were reviewed by me and considered in my medical decision making (see chart for details).     84 year old female here with a aphasia and now left hemiparesis with right gaze deviation, likely secondary to acute MCA stroke.  Patient is outside window and given recent stroke, is not a candidate for intervention.  Patient is DNR.  Family is leaning towards palliative care but otherwise except medical  treatment at this time.  CT head without bleed.  Of note, patient in A. fib RVR here.  Will place on diltiazem drip, admit to medicine. BP tolerating dilt drip at this time. Fluids given for mild  dehydration on labs.Per Neuro, hold on anticoagulation at this time 2/2 acute, large MCA stroke.  Final Clinical Impressions(s) / ED Diagnoses   Final diagnoses:  Acute right MCA stroke [facility])  Atrial fibrillation with rapid ventricular response [facility])    ED Discharge Orders    None       Duffy Bruce, MD 02/23/17 2352    Duffy Bruce, MD 02/23/17 2352

## 2017-02-23 NOTE — Consult Note (Addendum)
Requesting Physician: Dr. Ellender Hose    Chief Complaint: left side weakness  History obtained from: Patient family, EMS and Chart     HPI:                                                                                                                                       MERLIE NOGA is an 84 y.o. female with past medical history of atrial fibrillation, recent stroke with residual aphasia due to noncompliance of Xarelto, breast cancer, chronic kidney disease, GI bleed history, diabetes mellitus, hypertension nursing home by EMS for stroke alert.  Last seen normal was 1 PM when her sons visited her at the nursing home. She was discharged on 1/30 after suffering from left MCA stroke. Her new baseline was that she was severely aphasic with no significant motor deficits, however due to spinal stenosis with need assistance walking, for stroke was mostly on wheelchair.  With nursing staff noticed she seemed off at 3 pm and when trying to transfer her she fell on the floor and hit her head around 4. 30 pm. She arrived at 5.30 pm and on assessment had forced gaze and left plegia.  EMS was not sure patient receiving blood thinners and it was not listed in Medication list provided.  CT head showed no hemorrhage, aspects 10. Not given tPA as outside window, and recent stroke. Discussed with both sons regarding thrombectomy, however given her poor baseline function and disability, she was not a candidate for EMT. The family stated that she is a DNR.    Date last known well: 2.3.19 Time last known well: 1pm tPA Given: no, outside window NIHSS: 27 Baseline MRS 4     Past Medical History:  Diagnosis Date  . Back pain   . Breast cancer, left breast ([place]) 2010   lumpectomy  . Cerebrovascular disease    a. 03/2015 - high grade proximal right posterior inferior cerebellar artery stenosis seen on MRA corresponding with distribution of stroke at that time.  . Chronic diarrhea   . CKD (chronic kidney  disease), stage III ([place])   . Diabetes mellitus type 2, uncontrolled ([place])   . Essential hypertension   . Hiatal hernia    Stenosis of the spine  . Iron deficiency anemia 01/02/2017  . Lung nodule   . MGUS (monoclonal gammopathy of unknown significance)   . Paget's disease of bone   . Persistent atrial fibrillation ([place])    a. dx 12/2016.  Marland Kitchen Spinal stenosis   . Stroke ([place]) 03/2015  . Symptomatic anemia   . Upper GI bleed 11/2013    Past Surgical History:  Procedure Laterality Date  . BREAST SURGERY    . CHOLECYSTECTOMY    . ESOPHAGOGASTRODUODENOSCOPY (EGD) WITH PROPOFOL N/A 12/10/2013   Procedure: ESOPHAGOGASTRODUODENOSCOPY (EGD) WITH PROPOFOL;  Surgeon: Jeryl Columbia, MD;  Location: [facility] ENDOSCOPY;  Service: Endoscopy;  Laterality: N/A;  .  HEMORRHOID SURGERY    . PARTIAL HYSTERECTOMY      Family History  Problem Relation Age of Onset  . Hypertension Mother   . Dementia Father   . Cancer Brother        pancreas   Social History:  reports that  has never smoked. she has never used smokeless tobacco. She reports that she does not drink alcohol or use drugs.  Allergies:  Allergies  Allergen Reactions  . Tramadol Other (See Comments)    dizziness, "WENT OUT"  . Codeine Rash  . Demerol [Meperidine] Rash    Medications:                                                                                                                        I reviewed home medications   ROS:                                                                                                                                     14 systems reviewed and negative except above    Examination:                                                                                                      General: Appears well-developed and well-nourished.  Psych: Affect appropriate to situation Eyes: No scleral injection HENT: No OP obstrucion Head: Normocephalic.  Cardiovascular: Normal rate and regular  rhythm.  Respiratory: Effort normal and breath sounds normal to anterior ascultation GI: Soft.  No distension. There is no tenderness.  Skin: WDI   Neurological Examination Mental Status: Alert, globally aphasic, not following commands. Cranial Nerves: II:VF: Left HH III,IV, VI: forced right gaze deviation V,VII: left facial droop VIII: hearing normal bilaterally IX,X: uvula rises symmetrically XI: bilateral shoulder shrug XII: midline tongue extension Motor: Right : Upper extremity   4/5    Left:     Upper extremity   0/5  Lower extremity   4/5     Lower  extremity   0/5 Tone and bulk:normal tone throughout; no atrophy noted Sensory: neglect to left side Deep Tendon Reflexes: 2+ and symmetric throughout Plantars: Right: downgoing   Left: downgoing Cerebellar: Unable to assess Gait: unable to assess due to safety     Lab Results: Basic Metabolic Panel: Recent Labs  Lab 02/18/17 0841 02/19/17 0907 02/23/17 1749  NA 142 141 142  K 2.6* 3.5 3.5  CL 102 105 103  CO2 28 20*  --   GLUCOSE 97 86 132*  BUN 12 15 23*  CREATININE 1.03* 1.02* 1.20*  CALCIUM 8.5* 8.6*  --   PHOS 3.2  --   --     CBC: Recent Labs  Lab 02/18/17 0841 02/23/17 1749  WBC 7.7  --   NEUTROABS 5.4  --   HGB 14.0 16.7*  HCT 44.8 49.0*  MCV 95.3  --   PLT 109*  --     Coagulation Studies: No results for input(s): LABPROT, INR in the last 72 hours.  Imaging: Ct Cervical Spine Wo Contrast  Result Date: 02/23/2017 CLINICAL DATA:  Patient slipped out of wheelchair and struck head. No neck pain is reported. EXAM: CT CERVICAL SPINE WITHOUT CONTRAST TECHNIQUE: Multidetector CT imaging of the cervical spine was performed without intravenous contrast. Multiplanar CT image reconstructions were also generated. COMPARISON:  CT head reported separately. FINDINGS: Multiple repeats were obtained, but despite best efforts of the technologist, there is significant motion degradation. The study is of  marginal diagnostic utility. Alignment: Anatomic. Skull base and vertebrae: Motion degraded, no definite fracture. Soft tissues and spinal canal: No prevertebral fluid or swelling. Assessment for canal hematoma not possible. Disc levels: Multilevel disc space narrowing, most pronounced at C4-5 and C5-6. Upper chest: No pneumothorax or visible mass. Other: None. IMPRESSION: Motion degraded examination of marginal diagnostic utility. No gross cervical spine fracture or traumatic subluxation. Electronically Signed   By: Staci Righter M.D.   On: 02/23/2017 18:16   Ct Head Code Stroke Wo Contrast  Result Date: 02/23/2017 CLINICAL DATA:  Code stroke. LEFT-sided weakness. Patient slipped out of wheelchair and struck head EXAM: CT HEAD WITHOUT CONTRAST TECHNIQUE: Contiguous axial images were obtained from the base of the skull through the vertex without intravenous contrast. COMPARISON:  CT head 02/14/2017. MR brain 02/15/2017. CT cervical spine reported separately. FINDINGS: Brain: There is a subacute infarct affecting the LEFT frontal lobe without hemorrhage, identified on prior MR there is no acute stroke, acute hemorrhage, mass lesion, hydrocephalus, or extra-axial fluid there is advanced atrophy with extensive hypoattenuation of the white matter. Chronic BILATERAL cerebellar infarcts are observed. Vascular: Calcification of the cavernous internal carotid arteries consistent with cerebrovascular atherosclerotic disease. No signs of intracranial large vessel occlusion. Skull: Normal. Negative for fracture or focal lesion. Sinuses/Orbits: No acute finding. Other: None. ASPECTS [facility] Stroke Program Early CT Score) - Ganglionic level infarction (caudate, lentiform nuclei, internal capsule, insula, M1-M3 cortex): 7 - Supraganglionic infarction (M4-M6 cortex): 3 (allowing for previously identified infarct). Total score (0-10 with 10 being normal): 10 IMPRESSION: 1. Atrophy and small vessel disease. Subacute LEFT frontal  infarct is nonhemorrhagic. No acute intracranial findings. No posttraumatic sequelae are evident. 2. ASPECTS is 10. These results were communicated to [place] [facility] [place] 2/3/2019by text page via the [facility] messaging system. Electronically Signed   By: Staci Righter M.D.   On: 02/23/2017 18:13     ASSESSMENT AND PLAN   84 y.o. female with past medical history of atrial fibrillation, recent left MCA  stroke with residual aphasia due to noncompliance of Xarelto, breast cancer, chronic kidney disease, GI bleed history, diabetes mellitus, hypertension nursing home presents with right MCA stroke resulting in left hemiparesis and right gaze deviation. CT head showed no hemorrhage, aspects 10. Not given tPA as outside window  and recent stroke. Discussed with both sons regarding thrombectomy, however given her poor baseline function and disability, she was not a candidate for EMT and family in agreement. She has been declining since November, and following stroke doing poorly in nursing home.  The family stated that she is a DNR.  Plan Admit to medicine to floor  Hold Xarelto if she is on it,  Can start ASA 81, statin  No need for further workup for etiology of stroke, likely due to Afib. BP goal permissive HTN at 438/887 systolic Consider palliative consult in the morning NPO until swallow eval    Burkley Dech [facility] Pager Number [phone]

## 2017-02-24 ENCOUNTER — Other Ambulatory Visit: Payer: Self-pay

## 2017-02-24 ENCOUNTER — Inpatient Hospital Stay (HOSPITAL_COMMUNITY): Payer: Medicare Other

## 2017-02-24 ENCOUNTER — Encounter (HOSPITAL_COMMUNITY): Payer: Self-pay | Admitting: Primary Care

## 2017-02-24 DIAGNOSIS — Z515 Encounter for palliative care: Secondary | ICD-10-CM

## 2017-02-24 DIAGNOSIS — Z7189 Other specified counseling: Secondary | ICD-10-CM

## 2017-02-24 DIAGNOSIS — I63511 Cerebral infarction due to unspecified occlusion or stenosis of right middle cerebral artery: Secondary | ICD-10-CM | POA: Diagnosis present

## 2017-02-24 MED ORDER — STROKE: EARLY STAGES OF RECOVERY BOOK
Freq: Once | Status: AC
  Administered 2017-02-24: 05:00:00
  Filled 2017-02-24: qty 1

## 2017-02-24 MED ORDER — ACETAMINOPHEN 325 MG PO TABS: 650.0000 mg | ORAL_TABLET | ORAL | Status: DC | PRN

## 2017-02-24 MED ORDER — ACETAMINOPHEN 160 MG/5ML PO SOLN: 650.0000 mg | ORAL | Status: DC | PRN

## 2017-02-24 MED ORDER — ACETAMINOPHEN 650 MG RE SUPP
650.0000 mg | RECTAL | Status: DC | PRN
  Filled 2017-02-24: qty 1

## 2017-02-24 NOTE — Progress Notes (Signed)
SLP Cancellation Note  Patient Details Name: Kendra Maldonado MRN: [id number] DOB: 1933-09-16   Cancelled treatment:       Reason Eval/Treat Not Completed: (plan for full comfort, defer SLP eval per MD)   Oreatha Fabry, Katherene Ponto 02/24/2017, 10:11 AM

## 2017-02-24 NOTE — Progress Notes (Signed)
MD Wendee Beavers paged to confirm if patient is indeed full comfort care. Notes suggested care be changed to full comfort however no notes have been placed. Will continue to monitor and assess.

## 2017-02-24 NOTE — Progress Notes (Signed)
NEUROHOSPITALISTS STROKE TEAM - DAILY PROGRESS NOTE   ADMISSION HISTORY: Kendra Maldonado is an 84 y.o. female with past medical history of atrial fibrillation, recent stroke with residual aphasia due to noncompliance of Xarelto, breast cancer, chronic kidney disease, GI bleed history, diabetes mellitus, hypertension nursing home by EMS for stroke alert.  Last seen normal was 1 PM when her sons visited her at the nursing home. She was discharged on 1/30 after suffering from left MCA stroke. Her new baseline was that she was severely aphasic with no significant motor deficits, however due to spinal stenosis with need assistance walking, for stroke was mostly on wheelchair.  With nursing staff noticed she seemed off at 3 pm and when trying to transfer her she fell on the floor and hit her head around 4. 30 pm. She arrived at 5.30 pm and on assessment had forced gaze and left plegia.  EMS was not sure patient receiving blood thinners and it was not listed in Medication list provided.  CT head showed no hemorrhage, aspects 10. Not given tPA as outside window, and recent stroke. Discussed with both sons regarding thrombectomy, however given her poor baseline function and disability, she was not a candidate for EMT. The family stated that she is a DNR.   Date last known well: 2.3.19 Time last known well: 1pm tPA Given: no, outside window NIHSS: 27 Baseline MRS 4  SUBJECTIVE (INTERVAL HISTORY) Family is at the bedside. Patient is found laying in bed in NAD. Patient condition and deficits are unchanged. Family voices no new complaints. No new events reported overnight. Family has decided to transition patient to comfort care at this time.   OBJECTIVE Lab Results: CBC:  Recent Labs  Lab 02/18/17 0841 02/23/17 1749 02/23/17 1936  WBC 7.7  --  8.7  HGB 14.0 16.7* 14.4  HCT 44.8 49.0* 45.9  MCV 95.3  --  95.8  PLT 109*  --  139*    BMP: Recent Labs  Lab 02/18/17 0841 02/19/17 0907 02/23/17 1749 02/23/17 1936  NA 142 141 142 139  K 2.6* 3.5 3.5 3.7  CL 102 105 103 104  CO2 28 20*  --  17*  GLUCOSE 97 86 132* 153*  BUN 12 15 23* 18  CREATININE 1.03* 1.02* 1.20* 1.28*  CALCIUM 8.5* 8.6*  --  9.2  PHOS 3.2  --   --   --    Liver Function Tests:  Recent Labs  Lab 02/18/17 0841 02/23/17 1936  AST  --  18  ALT  --  11*  ALKPHOS  --  90  BILITOT  --  1.0  PROT  --  6.5  ALBUMIN 2.8* 3.3*   Coagulation Studies:  Recent Labs    02/23/17 1936  APTT 30  INR 1.12   Urinalysis:  Recent Labs  Lab 02/18/17 1917  COLORURINE YELLOW  APPEARANCEUR CLEAR  LABSPEC 1.013  PHURINE 6.0  GLUCOSEU NEGATIVE  HGBUR NEGATIVE  BILIRUBINUR NEGATIVE  KETONESUR NEGATIVE  PROTEINUR 30*  NITRITE NEGATIVE  LEUKOCYTESUR SMALL*   PHYSICAL EXAM Temp:  [98.4 F (36.9 C)-100.6 F (38.1 C)] 100.6 F (38.1 C) (02/04 1151) Pulse Rate:  [51-163] 67 (02/04 1100) Resp:  [20-29] 28 (02/04 1100) BP: (124-184)/(62-129) 124/62 (02/04 1100) SpO2:  [92 %-100 %] 94 % (02/04 1100) Weight:  [68.2 kg (150 lb 5.7 oz)] 68.2 kg (150 lb 5.7 oz) (02/04 0153) General - Well nourished, well developed, in no apparent distress HEENT-  Normocephalic, Normal external  eye/conjunctiva.  Normal external ears. Normal external nose, mucus membranes and septum.   Cardiovascular - Regular rate and rhythm  Respiratory - Lungs clear bilaterally. No wheezing. Abdomen - soft and non-tender, BS normal Extremities- no edema or cyanosis Neurological Examination Mental Status: Alert, globally aphasic, not following commands. Cranial Nerves: II:VF: Left HH III,IV, VI: forced right gaze deviation V,VII: left facial droop VIII: hearing normal bilaterally IX,X: uvula rises symmetrically XI: bilateral shoulder shrug XII: midline tongue extension Motor: Right : Upper extremity   4/5    Left:     Upper extremity   0/5  Lower extremity   4/5     Lower  extremity   0/5 Tone and bulk:normal tone throughout; no atrophy noted Sensory: neglect to left side Deep Tendon Reflexes: 2+ and symmetric throughout Plantars: Right: downgoing   Left: downgoing Cerebellar: Unable to assess Gait: unable to assess due to safety  IMAGING: I have personally reviewed the radiological images below and agree with the radiology interpretations. Dg Chest 1 View  Result Date: 02/24/2017 CLINICAL DATA:  Acute onset of altered mental status. Status post CVA. EXAM: CHEST 1 VIEW COMPARISON:  Chest radiograph performed 02/14/2017 FINDINGS: The lungs are well-aerated. Mild vascular congestion is noted. Mild retrocardiac opacity could reflect atelectasis or possibly mild infection. There is no evidence of pleural effusion or pneumothorax. The cardiomediastinal silhouette is borderline normal in size. No acute osseous abnormalities are seen. Clips are noted overlying the left axilla. IMPRESSION: Mild vascular congestion. Mild retrocardiac opacity may reflect atelectasis or possibly mild infection. Electronically Signed   By: Garald Balding M.D.   On: 02/24/2017 00:39   Ct Cervical Spine Wo Contrast  Result Date: 02/23/2017 CLINICAL DATA:  Patient slipped out of wheelchair and struck head. No neck pain is reported. EXAM: CT CERVICAL SPINE WITHOUT CONTRAST TECHNIQUE: Multidetector CT imaging of the cervical spine was performed without intravenous contrast. Multiplanar CT image reconstructions were also generated. COMPARISON:  CT head reported separately. FINDINGS: Multiple repeats were obtained, but despite best efforts of the technologist, there is significant motion degradation. The study is of marginal diagnostic utility. Alignment: Anatomic. Skull base and vertebrae: Motion degraded, no definite fracture. Soft tissues and spinal canal: No prevertebral fluid or swelling. Assessment for canal hematoma not possible. Disc levels: Multilevel disc space narrowing, most pronounced at  C4-5 and C5-6. Upper chest: No pneumothorax or visible mass. Other: None. IMPRESSION: Motion degraded examination of marginal diagnostic utility. No gross cervical spine fracture or traumatic subluxation. Electronically Signed   By: Staci Righter M.D.   On: 02/23/2017 18:16   Ct Head Code Stroke Wo Contrast  Result Date: 02/23/2017 CLINICAL DATA:  Code stroke. LEFT-sided weakness. Patient slipped out of wheelchair and struck head EXAM: CT HEAD WITHOUT CONTRAST TECHNIQUE: Contiguous axial images were obtained from the base of the skull through the vertex without intravenous contrast. COMPARISON:  CT head 02/14/2017. MR brain 02/15/2017. CT cervical spine reported separately. FINDINGS: Brain: There is a subacute infarct affecting the LEFT frontal lobe without hemorrhage, identified on prior MR there is no acute stroke, acute hemorrhage, mass lesion, hydrocephalus, or extra-axial fluid there is advanced atrophy with extensive hypoattenuation of the white matter. Chronic BILATERAL cerebellar infarcts are observed. Vascular: Calcification of the cavernous internal carotid arteries consistent with cerebrovascular atherosclerotic disease. No signs of intracranial large vessel occlusion. Skull: Normal. Negative for fracture or focal lesion. Sinuses/Orbits: No acute finding. Other: None. ASPECTS [facility] Stroke Program Early CT Score) - Ganglionic level infarction (caudate, lentiform  nuclei, internal capsule, insula, M1-M3 cortex): 7 - Supraganglionic infarction (M4-M6 cortex): 3 (allowing for previously identified infarct). Total score (0-10 with 10 being normal): 10 IMPRESSION: 1. Atrophy and small vessel disease. Subacute LEFT frontal infarct is nonhemorrhagic. No acute intracranial findings. No posttraumatic sequelae are evident. 2. ASPECTS is 10. These results were communicated to [place] [facility] [place] 2/3/2019by text page via the [facility] messaging system. Electronically Signed   By: Staci Righter M.D.   On: 02/23/2017  18:13      IMPRESSION: Ms. BERGEN MELLE is a 84 y.o. female with PMH of atrial fibrillation, recent stroke with residual aphasia due to noncompliance of Xarelto, breast cancer, chronic kidney disease, GI bleed history, diabetes mellitus, hypertension who presents with large new  right MCA stroke resulting in left hemiparesis and right gaze deviation.  Suspected Etiology: atrial fibrillation, noncompliance of Xarelto, Resultant Symptoms: left hemiparesis and right gaze deviation. Stroke Risk Factors: diabetes mellitus, hyperlipidemia and hypertension Other Stroke Risk Factors: Advanced age, Breast Cancer, Hx CVA  Outstanding Stroke Work-up Studies:    No further work up needed  02/24/2017 ASSESSMENT:   Neuro exam unchanged. Long discussion with family at bedside. Family has decided to transition patient to comfort care at this time. Discussed plans with Dr Wendee Beavers  PLAN  02/24/2017: Consult Palliative care for comfort care  DYSPHAGIA: NPO until passes SLP swallow evaluation Aspiration Precautions in progress  HYPERTENSION: Stable Long term BP goal normotensive.  HYPERLIPIDEMIA:    Component Value Date/Time   CHOL 157 02/15/2017 0231   TRIG 103 02/15/2017 0231   HDL 45 02/15/2017 0231   CHOLHDL 3.5 02/15/2017 0231   VLDL 21 02/15/2017 0231   LDLCALC 91 02/15/2017 0231  Home Meds:  Lipitor 40 mg LDL  goal < 70 No need for statin at discharge, patient transitioning to comfort care  PRE- DIABETES: Lab Results  Component Value Date   HGBA1C 6.3 (H) 02/15/2017  HgbA1c goal < 7.0 Continue CBG monitoring and SSI to maintain glucose 140-180 mg/dl  Other Active Problems: Active Problems:   Stroke (cerebrum) (HCC)   Acute right MCA stroke [facility])    Hospital day # 1 VTE prophylaxis: SCD's  Diet : Diet NPO time specified Fall precautions   FAMILY UPDATES: family at bedside  TEAM UPDATES: Velvet Bathe, MD STATUS:  DNR  Palliative care to be consulted for comfort care     Prior Home Stroke Medications:  Xarelto (rivaroxaban) daily  Discharge Stroke Meds:  Please discharge patient on No antithrombotic     Assessment & plan discussed with with attending physician and they are in agreement.    Mary Sella, ANP-C Stroke Neurology Team 02/24/2017 11:54 AM I have personally examined this patient, reviewed notes, independently viewed imaging studies, participated in medical decision making and plan of care.ROS completed by me personally and pertinent positives fully documented  I have made any additions or clarifications directly to the above note. Agree with note above. Okay and was transferred to nursing home where it appears unfortunately she has had a new large right MCA infarct and remains globally aphasic with dense hemiplegia. Her prognosis is quite poor. All family members present at the bedside agree to DO NOT RESUSCITATE and full comfort care and palliative care. Recommend no further stroke workup and arranged for transfer back to nursing home to palliative care setting. Discuss with son, sisters and Dr. Wendee Beavers and answered questions. This patient is critically ill and at significant risk of neurological worsening, death  and care requires constant monitoring of vital signs, hemodynamics,respiratory and cardiac monitoring, extensive review of multiple databases, frequent neurological assessment, discussion with family, other specialists and medical decision making of high complexity.I have made any additions or clarifications directly to the above note.This critical care time does not reflect procedure time, or teaching time or supervisory time of PA/NP/Med Resident etc but could involve care discussion time.  I spent 30 minutes of neurocritical care time  in the care of  this patient. Neurology to sign-off at this time. Please call with any further questions or concerns. Thank you for this consultation.   Antony Contras, MD Medical Director [facility] Stroke  Center Pager: [phone] 02/24/2017 12:59 PM    To contact Stroke Continuity provider, please refer to [email or web address] After hours, contact General Neurology

## 2017-02-24 NOTE — Progress Notes (Signed)
PROGRESS NOTE    Kendra Maldonado  GGY:[id number] DOB: 09-02-1933 DOA: 02/23/2017 PCP: Lavone Orn, MD    Brief Narrative:  84 y.o. female with medical history significant of PAF, DM type II, CKD stage III, CVA on Xarelto, Paget's disease, history of breast cancer, MGUS; who presented after fall with left-sided deficit.   Assessment & Plan:   Active Problems:   Stroke (cerebrum) (HCC)/Acute right MCA stroke [facility]) - plan is for comfort care measures    Palliative care encounter - palliative team consulted for assistance with comfort care measures.    Goals of care, counseling/discussion   Encounter for hospice care discussion  DVT prophylaxis: comfort care Code Status: dnr Family Communication: d/c family at bedside. Disposition Plan: hospice either home or residential   Consultants:   Palliative care   Procedures: none   Antimicrobials:none   Subjective: Family would like comfort care measures. Discussed with neurologist and palliative care team  Objective: Vitals:   02/24/17 1500 02/24/17 1546 02/24/17 1645 02/24/17 1700  BP: 126/76 126/76  (!) 148/89  Pulse: 91   63  Resp: (!) 23   (!) 25  Temp:  (!) 100.6 F (38.1 C) 99.4 F (37.4 C)   TempSrc:  Axillary Rectal   SpO2: 97%   96%  Weight:      Height:        Intake/Output Summary (Last 24 hours) at 02/24/2017 1709 Last data filed at 02/24/2017 1500 Gross per 24 hour  Intake 1428.92 ml  Output 675 ml  Net 753.92 ml   Filed Weights   02/24/17 0153  Weight: 68.2 kg (150 lb 5.7 oz)    Examination:  General exam: Appears calm and comfortable, in nad. Respiratory system: Clear to auscultation. Respiratory effort normal. Cardiovascular system: S1 & S2 heard, RRR. No JVD, murmurs, rubs Gastrointestinal system: Abdomen is nondistended and nontender. Central nervous system: resting comfortably Extremities: warm Skin: No rashes, lesions or ulcers, on limited exam. Psychiatry: pt resting in nad. Did not  assess    Data Reviewed: I have personally reviewed following labs and imaging studies  CBC: Recent Labs  Lab 02/18/17 0841 02/23/17 1749 02/23/17 1936  WBC 7.7  --  8.7  NEUTROABS 5.4  --  7.0  HGB 14.0 16.7* 14.4  HCT 44.8 49.0* 45.9  MCV 95.3  --  95.8  PLT 109*  --  035*   Basic Metabolic Panel: Recent Labs  Lab 02/18/17 0841 02/19/17 0907 02/23/17 1749 02/23/17 1936  NA 142 141 142 139  K 2.6* 3.5 3.5 3.7  CL 102 105 103 104  CO2 28 20*  --  17*  GLUCOSE 97 86 132* 153*  BUN 12 15 23* 18  CREATININE 1.03* 1.02* 1.20* 1.28*  CALCIUM 8.5* 8.6*  --  9.2  PHOS 3.2  --   --   --    GFR: Estimated Creatinine Clearance: 33.5 mL/min (A) (by C-G formula based on SCr of 1.28 mg/dL (H)). Liver Function Tests: Recent Labs  Lab 02/18/17 0841 02/23/17 1936  AST  --  18  ALT  --  11*  ALKPHOS  --  90  BILITOT  --  1.0  PROT  --  6.5  ALBUMIN 2.8* 3.3*   No results for input(s): LIPASE, AMYLASE in the last 168 hours. No results for input(s): AMMONIA in the last 168 hours. Coagulation Profile: Recent Labs  Lab 02/23/17 1936  INR 1.12   Cardiac Enzymes: No results for input(s): CKTOTAL, CKMB, CKMBINDEX,  TROPONINI in the last 168 hours. BNP (last 3 results) No results for input(s): PROBNP in the last 8760 hours. HbA1C: No results for input(s): HGBA1C in the last 72 hours. CBG: Recent Labs  Lab 02/19/17 1857 02/23/17 1733  GLUCAP 144* 126*   Lipid Profile: No results for input(s): CHOL, HDL, LDLCALC, TRIG, CHOLHDL, LDLDIRECT in the last 72 hours. Thyroid Function Tests: No results for input(s): TSH, T4TOTAL, FREET4, T3FREE, THYROIDAB in the last 72 hours. Anemia Panel: No results for input(s): VITAMINB12, FOLATE, FERRITIN, TIBC, IRON, RETICCTPCT in the last 72 hours. Sepsis Labs: No results for input(s): PROCALCITON, LATICACIDVEN in the last 168 hours.  Recent Results (from the past 240 hour(s))  MRSA PCR Screening     Status: None   Collection Time:  02/15/17  2:14 AM  Result Value Ref Range Status   MRSA by PCR NEGATIVE NEGATIVE Final    Comment:        The GeneXpert MRSA Assay (FDA approved for NASAL specimens only), is one component of a comprehensive MRSA colonization surveillance program. It is not intended to diagnose MRSA infection nor to guide or monitor treatment for MRSA infections.          Radiology Studies: Dg Chest 1 View  Result Date: 02/24/2017 CLINICAL DATA:  Acute onset of altered mental status. Status post CVA. EXAM: CHEST 1 VIEW COMPARISON:  Chest radiograph performed 02/14/2017 FINDINGS: The lungs are well-aerated. Mild vascular congestion is noted. Mild retrocardiac opacity could reflect atelectasis or possibly mild infection. There is no evidence of pleural effusion or pneumothorax. The cardiomediastinal silhouette is borderline normal in size. No acute osseous abnormalities are seen. Clips are noted overlying the left axilla. IMPRESSION: Mild vascular congestion. Mild retrocardiac opacity may reflect atelectasis or possibly mild infection. Electronically Signed   By: Garald Balding M.D.   On: 02/24/2017 00:39   Ct Cervical Spine Wo Contrast  Result Date: 02/23/2017 CLINICAL DATA:  Patient slipped out of wheelchair and struck head. No neck pain is reported. EXAM: CT CERVICAL SPINE WITHOUT CONTRAST TECHNIQUE: Multidetector CT imaging of the cervical spine was performed without intravenous contrast. Multiplanar CT image reconstructions were also generated. COMPARISON:  CT head reported separately. FINDINGS: Multiple repeats were obtained, but despite best efforts of the technologist, there is significant motion degradation. The study is of marginal diagnostic utility. Alignment: Anatomic. Skull base and vertebrae: Motion degraded, no definite fracture. Soft tissues and spinal canal: No prevertebral fluid or swelling. Assessment for canal hematoma not possible. Disc levels: Multilevel disc space narrowing, most  pronounced at C4-5 and C5-6. Upper chest: No pneumothorax or visible mass. Other: None. IMPRESSION: Motion degraded examination of marginal diagnostic utility. No gross cervical spine fracture or traumatic subluxation. Electronically Signed   By: Staci Righter M.D.   On: 02/23/2017 18:16   Ct Head Code Stroke Wo Contrast  Result Date: 02/23/2017 CLINICAL DATA:  Code stroke. LEFT-sided weakness. Patient slipped out of wheelchair and struck head EXAM: CT HEAD WITHOUT CONTRAST TECHNIQUE: Contiguous axial images were obtained from the base of the skull through the vertex without intravenous contrast. COMPARISON:  CT head 02/14/2017. MR brain 02/15/2017. CT cervical spine reported separately. FINDINGS: Brain: There is a subacute infarct affecting the LEFT frontal lobe without hemorrhage, identified on prior MR there is no acute stroke, acute hemorrhage, mass lesion, hydrocephalus, or extra-axial fluid there is advanced atrophy with extensive hypoattenuation of the white matter. Chronic BILATERAL cerebellar infarcts are observed. Vascular: Calcification of the cavernous internal carotid arteries consistent  with cerebrovascular atherosclerotic disease. No signs of intracranial large vessel occlusion. Skull: Normal. Negative for fracture or focal lesion. Sinuses/Orbits: No acute finding. Other: None. ASPECTS [facility] Stroke Program Early CT Score) - Ganglionic level infarction (caudate, lentiform nuclei, internal capsule, insula, M1-M3 cortex): 7 - Supraganglionic infarction (M4-M6 cortex): 3 (allowing for previously identified infarct). Total score (0-10 with 10 being normal): 10 IMPRESSION: 1. Atrophy and small vessel disease. Subacute LEFT frontal infarct is nonhemorrhagic. No acute intracranial findings. No posttraumatic sequelae are evident. 2. ASPECTS is 10. These results were communicated to [place] [facility] [place] 2/3/2019by text page via the [facility] messaging system. Electronically Signed   By: Staci Righter M.D.   On:  02/23/2017 18:13    Scheduled Meds: Continuous Infusions: . diltiazem (CARDIZEM) infusion Stopped (02/24/17 1130)     LOS: 1 day    Time spent: > 35 minutes  Velvet Bathe, MD [facility] Pager [phone]  If 7PM-7AM, please contact night-coverage [email or web address] Password [email or web address] 02/24/2017, 5:09 PM

## 2017-02-24 NOTE — Progress Notes (Signed)
[facility] [place] - RN visit  Received request from [place] for family interest in [facility]. Chart reviewed and spoke with Daughter [name] to acknowledge referral. Unfortunately [facility] is not able to offer a room today.  Left voicemail to make CSW aware. Spoke with Daughter [name] to update on bed availability, answer questions about Hospice and next steps.   [facility] Liaison will follow up with CSW and family tomorrow about [facility] bed availability.    Please do not hesitate to call with Hospice related questions.  Thank you for referral,  Gar Ponto, [facility] Liaison [place] found on AMION

## 2017-02-24 NOTE — Progress Notes (Signed)
CSW received consult for possible hospice placement- will await palliative or MD discussion with family to confirm pt is hospice appropriate and family in agreement before discussing facility options with family  Please reconsult when need for residential hospice placement is confirmed  Jorge Ny, [place] Social Worker [phone]

## 2017-02-24 NOTE — Consult Note (Signed)
Consultation Note Date: 02/24/2017   Patient Name: Kendra Maldonado  DOB: 23-Mar-1933  MRN: [id number]  Age / Sex: 84 y.o., female  PCP: Lavone Orn, MD Referring Physician: Velvet Bathe, MD  Reason for Consultation: Establishing goals of care, Inpatient hospice referral and Psychosocial/spiritual support  HPI/Patient Profile: 84 y.o. female  with past medical history of cerebral vascular disease high-grade right posterior inferior cerebral artery stenosis on MRA 3-17, history of stroke, persistent atrial fibrillation, stage III kidney disease, diabetes, history of spinal stenosis, back pain, symptomatic anemia with upper GI bleed admitted on 02/23/2017 with right MCA stroke with left hemiparesis and right word gaze.   Clinical Assessment and Goals of Care: Kendra Maldonado is resting quietly in bed.  She will open her eyes when I touch her arm and call her name, but not try to communicate in any meaningful manner.  Present today at bedside is daughter-in-law Jocelyn Lamer.  As we start a conversation sons Elenore Rota and Shanon Brow arrived.  We talked about the severity of Kendra Maldonado illness/stroke.  We talked about comfort and dignity at end of life.  Family is requesting placement in residential hospice.  Choices offered, family elects hospice and palliative care of [facility].  We speak in detail about what is and is not offered at residential hospice.  Shanon Brow states that Kendra Maldonado has been eating less over the last few months.  He states over the last few weeks they have not been able to get more than a few spoonfuls in her per day.  They asked about prognosis.  Prognosis is discussed with permission.  I share that 2 weeks or less would not be surprising based on Mrs. Gerilyn Nestle current clinical presentation.  I shared that one of the benefits of residential hospice is skilled nursing staff to  explain what is happening and keep family informed.  Conference with social work related to disposition. communication with hospitalist related to disposition.  Health care power of attorney NEXT OF KIN -2 sons, Saleah Rishel and Esmond Plants. Contact person is daughter-in-law Genessa Beman, [phone], Kendra Maldonado wife.   SUMMARY OF RECOMMENDATIONS   Full comfort care. Transition [facility].  Code Status/Advance Care Planning:  DNR  Symptom Management:   Per hospitalist/neurology, no additional needs at this time.  Symptom management per hospice protocol upon arrival to residential hospice  Palliative Prophylaxis:   Oral Care and Turn Reposition  Additional Recommendations (Limitations, Scope, Preferences):  Full Comfort Care  Psycho-social/Spiritual:   Desire for further Chaplaincy support:no  Additional Recommendations: Caregiving  Support/Resources and Education on Hospice  Prognosis:   < 2 weeks would not be surprising based on frailty, functional status, severity of stroke, decreased by mouth intake over the last 2 months.  Discharge Planning: Family is requesting comfort and dignity at end of life, residential hospice at [facility].      Primary Diagnoses: Present on Admission: . Stroke (cerebrum) ([place]) . Acute right MCA stroke ([place])   I have  reviewed the medical record, interviewed the patient and family, and examined the patient. The following aspects are pertinent.  Past Medical History:  Diagnosis Date  . Back pain   . Breast cancer, left breast ([place]) 2010   lumpectomy  . Cerebrovascular disease    a. 03/2015 - high grade proximal right posterior inferior cerebellar artery stenosis seen on MRA corresponding with distribution of stroke at that time.  . Chronic diarrhea   . CKD (chronic kidney disease), stage III ([place])   . Diabetes mellitus type 2, uncontrolled ([place])   . Essential hypertension   . Hiatal hernia     Stenosis of the spine  . Iron deficiency anemia 01/02/2017  . Lung nodule   . MGUS (monoclonal gammopathy of unknown significance)   . Paget's disease of bone   . Persistent atrial fibrillation ([place])    a. dx 12/2016.  Marland Kitchen Spinal stenosis   . Stroke ([place]) 03/2015  . Symptomatic anemia   . Upper GI bleed 11/2013   Social History   Socioeconomic History  . Marital status: Married    Spouse name: None  . Number of children: 2  . Years of education: 76  . Highest education level: None  Social Needs  . Financial resource strain: None  . Food insecurity - worry: None  . Food insecurity - inability: None  . Transportation needs - medical: None  . Transportation needs - non-medical: None  Occupational History  . Occupation: Retired    Comment: [employer]  Tobacco Use  . Smoking status: Never Smoker  . Smokeless tobacco: Never Used  Substance and Sexual Activity  . Alcohol use: No  . Drug use: No  . Sexual activity: None  Other Topics Concern  . None  Social History Narrative   Widowed, lives home alone   Has 2 sons and 4 grandchildren   Right-handed   Rarely drinks caffeine   Family History  Problem Relation Age of Onset  . Hypertension Mother   . Dementia Father   . Cancer Brother        pancreas   Scheduled Meds: Continuous Infusions: . diltiazem (CARDIZEM) infusion 15 mg/hr (02/24/17 0436)   PRN Meds:.acetaminophen **OR** acetaminophen (TYLENOL) oral liquid 160 mg/5 mL **OR** acetaminophen, hydrALAZINE Medications Prior to Admission:  Prior to Admission medications   Medication Sig Start Date End Date Taking? Authorizing Provider  atorvastatin (LIPITOR) 40 MG tablet Take 1 tablet (40 mg total) by mouth daily at 6 PM. 04/06/15  Yes Hosie Poisson, MD  Cholecalciferol (D 2000) 2000 UNITS TABS Take 2,000 Units by mouth daily.   Yes [provider]  metoprolol tartrate (LOPRESSOR) 50 MG tablet Take 1 tablet (50 mg total) by mouth every 8 (eight) hours.  02/19/17  Yes Dana Allan I, MD  mirtazapine (REMERON) 15 MG tablet Take 15 mg by mouth at bedtime.   Yes [provider]  omeprazole (PRILOSEC) 20 MG capsule Take 1 capsule (20 mg total) by mouth 2 (two) times daily before a meal. Patient taking differently: Take 20 mg by mouth 2 (two) times daily.  01/09/17  Yes Domenic Polite, MD  potassium chloride 20 MEQ TBCR Take 10 mEq by mouth 2 (two) times daily for 7 days. Patient taking differently: Take 20 mEq by mouth See admin instructions. Take 1 tablet (20 meq) by mouth twice daily from 02/19/17 to 02/26/17 02/19/17 02/26/17 Yes Ogbata, Babs Bertin, MD  QUEtiapine (SEROQUEL) 25 MG tablet Take 0.5 tablets (12.5 mg total) by mouth  at bedtime. 02/19/17  Yes Bonnell Public, MD  mirtazapine (REMERON SOL-TAB) 15 MG disintegrating tablet Take 1 tablet (15 mg total) by mouth at bedtime. Patient not taking: Reported on 02/23/2017 02/19/17   Bonnell Public, MD  Rivaroxaban (XARELTO) 15 MG TABS tablet Take 1 tablet (15 mg total) by mouth daily with supper. Patient not taking: Reported on 02/23/2017 01/09/17   Domenic Polite, MD   Allergies  Allergen Reactions  . Tramadol Other (See Comments)    dizziness, "WENT OUT"  . Codeine Rash  . Demerol [Meperidine] Rash   Review of Systems  Unable to perform ROS: Acuity of condition    Physical Exam  Constitutional: No distress.  Appears acutely/chronically ill, unable to make her most basic needs known  HENT:  Scabbing/abrasion to the forehead  Cardiovascular: Normal rate and regular rhythm.  Pulmonary/Chest: Effort normal. No respiratory distress.  Abdominal: She exhibits no distension.  Neurological:  Opens eyes to touch, does not verbally respond  Skin: Skin is warm and dry.  Nursing note and vitals reviewed.   Vital Signs: BP 124/62   Pulse 67   Temp (!) 100.6 F (38.1 C) (Axillary)   Resp (!) 28   Ht 5\' 7"  (1.702 m)   Wt 68.2 kg (150 lb 5.7 oz)   SpO2 94%   BMI 23.55 kg/m    Pain Assessment: No/denies pain   Pain Score: Asleep   SpO2: SpO2: 94 % O2 Device:SpO2: 94 % O2 Flow Rate: .   IO: Intake/output summary:   Intake/Output Summary (Last 24 hours) at 02/24/2017 1519 Last data filed at 02/24/2017 0800 Gross per 24 hour  Intake 1383.92 ml  Output 675 ml  Net 708.92 ml    LBM:   Baseline Weight: Weight: 68.2 kg (150 lb 5.7 oz) Most recent weight: Weight: 68.2 kg (150 lb 5.7 oz)     Palliative Assessment/Data:   Flowsheet Rows     Most Recent Value  Intake Tab  Referral Department  Hospitalist  Unit at Time of Referral  ICU  Palliative Care Primary Diagnosis  Neurology  Date Notified  02/24/17  Reason for referral  Clarify Goals of Care, Psychosocial or Spiritual support  Date of Admission  02/23/17  Date first seen by Palliative Care  02/24/17  # of days Palliative referral response time  0 Day(s)  # of days IP prior to Palliative referral  1  Clinical Assessment  Palliative Performance Scale Score  20%  Pain Max last 24 hours  Not able to report  Pain Min Last 24 hours  Not able to report  Dyspnea Max Last 24 Hours  Not able to report  Dyspnea Min Last 24 hours  Not able to report  Psychosocial & Spiritual Assessment  Palliative Care Outcomes  Patient/Family meeting held?  Yes  Who was at the meeting?  Adult sons Ashok Pall at bedside  [place] regarding hospice, Provided advance care planning, Provided psychosocial or spiritual support, Provided end of life care assistance, Transitioned to hospice  Patient/Family wishes: Interventions discontinued/not started   Mechanical Ventilation, Antibiotics      Time In: 1400 Time Out: 1520 Time Total: 80 minutes  Greater than 50%  of this time was spent counseling and coordinating care related to the above assessment and plan.  Signed by: Drue Novel, NP   Please contact Palliative Medicine Team phone at [phone] for questions and concerns.  For  individual provider: See Shea Evans

## 2017-02-24 NOTE — Progress Notes (Signed)
CSW consulted for residential hospice placement- Kendra Maldonado is first choice per palliative  CSW made referral to hospital liaison from [place] who will review for possible admission  CSW will continue to follow  Jorge Ny, [place] Social Worker [phone]

## 2017-02-25 MED ORDER — LORAZEPAM 1 MG PO TABS: 1.0000 mg | ORAL_TABLET | Freq: Four times a day (QID) | ORAL | Status: DC | PRN

## 2017-02-25 MED ORDER — LORAZEPAM 1 MG PO TABS: 1.0000 mg | ORAL_TABLET | Freq: Four times a day (QID) | ORAL | 0 refills | Status: AC | PRN

## 2017-02-25 MED ORDER — POLYVINYL ALCOHOL 1.4 % OP SOLN: 2.0000 [drp] | OPHTHALMIC | Status: DC | PRN

## 2017-02-25 NOTE — Progress Notes (Signed)
Daily Progress Note   Patient Name: Kendra Maldonado       Date: 02/25/2017 DOB: 1933/03/29  Age: 84 y.o. MRN#: [id number] Attending Physician: Velvet Bathe, MD Primary Care Physician: Lavone Orn, MD Admit Date: 02/23/2017  Reason for Consultation/Follow-up: Establishing goals of care, Inpatient hospice referral and Psychosocial/spiritual support  Subjective: Kendra Maldonado is resting quietly.  She looks comfortable, there are no  symptom management needs at this time.   Conference with nursing staff related to patient care. Conference with social worker related to disposition.  Kendra Maldonado has been accepted at residential hospice, [facility], for comfort and dignity at end of life. [place] form completed.  Length of Stay: 2  Current Medications: Scheduled Meds:    Continuous Infusions: . diltiazem (CARDIZEM) infusion Stopped (02/24/17 1130)    PRN Meds: acetaminophen **OR** acetaminophen (TYLENOL) oral liquid 160 mg/5 mL **OR** acetaminophen, hydrALAZINE, LORazepam, polyvinyl alcohol  Physical Exam  Constitutional: No distress.  HENT:  Head: Normocephalic.  Abrasion/scabbing to forehead  Cardiovascular: Normal rate.  Pulmonary/Chest: Effort normal. No respiratory distress.  Abdominal: Soft. She exhibits no distension.  Neurological: She is alert.  Major stroke, nonverbal   Skin: Skin is warm and dry.  Nursing note and vitals reviewed.           Vital Signs: BP (!) 154/98 (BP Location: Left Arm)   Pulse (!) 54   Temp 99.1 F (37.3 C) (Oral)   Resp (!) 28   Ht 5\' 7"  (1.702 m)   Wt 68.2 kg (150 lb 5.7 oz)   SpO2 97%   BMI 23.55 kg/m  SpO2: SpO2: 97 % O2 Device: O2 Device: Not Delivered O2 Flow Rate:    Intake/output summary:   Intake/Output Summary (Last 24  hours) at 02/25/2017 1025 Last data filed at 02/25/2017 0900 Gross per 24 hour  Intake 0 ml  Output 250 ml  Net -250 ml   LBM:   Baseline Weight: Weight: 68.2 kg (150 lb 5.7 oz) Most recent weight: Weight: 68.2 kg (150 lb 5.7 oz)       Palliative Assessment/Data:    Flowsheet Rows     Most Recent Value  Intake Tab  Referral Department  Hospitalist  Unit at Time of Referral  ICU  Palliative Care Primary Diagnosis  Neurology  Date Notified  02/24/17  Palliative Care Type  New Palliative care  Reason for referral  Clarify Goals of Care, Psychosocial or Spiritual support  Date of Admission  02/23/17  Date first seen by Palliative Care  02/24/17  # of days Palliative referral response time  0 Day(s)  # of days IP prior to Palliative referral  1  Clinical Assessment  Palliative Performance Scale Score  20%  Pain Max last 24 hours  Not able to report  Pain Min Last 24 hours  Not able to report  Dyspnea Max Last 24 Hours  Not able to report  Dyspnea Min Last 24 hours  Not able to report  Psychosocial & Spiritual Assessment  Palliative Care Outcomes  Patient/Family meeting held?  Yes  Who was at the meeting?  Adult sons Ashok Pall at bedside  [place] regarding hospice, Provided advance care planning, Provided psychosocial or spiritual support, Provided end of life care assistance, Transitioned to hospice  Patient/Family wishes: Interventions discontinued/not started   Mechanical Ventilation, Antibiotics      Patient Active Problem List   Diagnosis Date Noted  . Acute right MCA stroke ([place]) 02/24/2017  . Palliative care encounter   . Goals of care, counseling/discussion   . Encounter for hospice care discussion   . Non-compliance   . Pulmonary hypertension ([place])   . Tricuspid valve insufficiency   . Diabetes mellitus type 2 in nonobese (HCC)   . Benign essential HTN   . Polycythemia vera ([place])   . Stage 3 chronic kidney disease ([place])   .  Depression   . History of stroke   . Polycythemia 02/14/2017  . Hypokalemia 02/14/2017  . Persistent atrial fibrillation ([place]) 01/27/2017  . Spinal stenosis   . Paget's disease of bone   . Lung nodule   . Essential hypertension   . Hiatal hernia   . Diabetes mellitus type 2, uncontrolled ([place])   . CKD (chronic kidney disease), stage II   . Cerebrovascular disease   . Dysphagia 01/06/2017  . Iron deficiency anemia 01/02/2017  . Atrial fibrillation with rapid ventricular response ([place])   . Diarrhea 12/31/2016  . Rapid atrial fibrillation ([place]) 12/31/2016  . MGUS (monoclonal gammopathy of unknown significance) 05/15/2016  . HLD (hyperlipidemia) 04/05/2015  . Cerebrovascular accident (CVA) due to occlusion of right cerebellar artery ([place])   . Stroke ([place]) 04/04/2015  . Cerebellar stroke, acute ([place]) 04/04/2015  . Hypertension 04/04/2015  . Stroke (cerebrum) ([place]) 04/04/2015  . Type 2 diabetes mellitus with vascular disease ([place]) 04/04/2015  . Anemia due to GI blood loss   . Symptomatic anemia   . Acute posthemorrhagic anemia 12/09/2013  . Upper GI bleed 12/09/2013  . Back pain 12/09/2013  . Chronic diarrhea 12/09/2013  . Breast cancer, left breast ([place]) 01/22/2008    Palliative Care Assessment & Plan   Patient Profile: 84 y.o. female  with past medical history of cerebral vascular disease high-grade right posterior inferior cerebral artery stenosis on MRA 3-17, history of stroke, persistent atrial fibrillation, stage III kidney disease, diabetes, history of spinal stenosis, back pain, symptomatic anemia with upper GI bleed admitted on 02/23/2017 with right MCA stroke with left hemiparesis and right word gaze.   Assessment: Acute right MCA stroke; comfort measures only per family request, accepted to residential hospice at [facility]  Recommendations/Plan:  Residential hospice, comfort and dignity at end of life, [place]  Goals of Care and Additional  Recommendations:  Limitations on Scope of Treatment: Full Comfort  Care  Code Status:    Code Status Orders  (From admission, onward)        Start     Ordered   02/24/17 0003  Do not attempt resuscitation (DNR)  Continuous    Question Answer Comment  In the event of cardiac or respiratory ARREST Do not call a "code blue"   In the event of cardiac or respiratory ARREST Do not perform Intubation, CPR, defibrillation or ACLS   In the event of cardiac or respiratory ARREST Use medication by any route, position, wound care, and other measures to relive pain and suffering. May use oxygen, suction and manual treatment of airway obstruction as needed for comfort.      02/24/17 0002    Code Status History    Date Active Date Inactive Code Status Order ID Comments User Context   02/15/2017 00:36 02/20/2017 01:22 Full Code [id number]  Jani Gravel, MD Inpatient   12/31/2016 13:22 01/09/2017 16:48 DNR [id number]  Donne Hazel, MD ED   12/31/2016 13:20 12/31/2016 13:22 Full Code [id number]  Donne Hazel, MD ED   04/04/2015 06:53 04/06/2015 16:43 Full Code [id number]  [name], MD ED   12/09/2013 20:32 12/11/2013 19:21 Full Code [id number]  [name], MD Inpatient       Prognosis:   < 2 weeks would not be surprising based on frailty, functional status, severity of stroke, decreased by mouth intake over the last 2 months  Discharge Planning:  [facility] residential hospice for comfort and dignity at end of life.  Care plan was discussed with nursing staff, social worker, and Dr. Wendee Beavers on next rounds.  Thank you for allowing the Palliative Medicine Team to assist in the care of this patient.   Time In: 0845 Time Out: 0920 Total Time 35 minutes Prolonged Time Billed  no       Greater than 50%  of this time was spent counseling and coordinating care related to the above assessment and plan.  Drue Novel, NP  Please contact Palliative Medicine Team phone at [phone]  for questions and concerns.

## 2017-02-25 NOTE — Progress Notes (Signed)
Nutrition Brief Note  Pt identified on the Malnutrition Screening Tool (MST) Report. Pt has transitioned to full comfort care measures. No nutrition interventions warranted at this time.  Please consult as needed.   Arthur Holms, RD, LDN Pager #: [phone] After-Hours Pager #: [phone]

## 2017-02-25 NOTE — Progress Notes (Signed)
Nurse shared this patient preparing to go to [facility] today.  Family was with her. I had prayer with them.Conard Novak, Chaplain   02/25/17 1100  Clinical Encounter Type  Visited With Patient and family together  Visit Type Initial;Spiritual support;Social support;Critical Care;Other (Comment)  Referral From Nurse (patient preparing to go to [facility])  Consult/Referral To [place]

## 2017-02-25 NOTE — Progress Notes (Signed)
Attempted to give call report to RN at [facility] at [phone]. Number provided by hospice personal.

## 2017-02-25 NOTE — Progress Notes (Signed)
[facility] Liaison: RN visit  Received request from Tammy Sours, [place] for family interest in [facility] with request for transfer today. Chart reviewed. Met with son, Michalina Calbert to confirm interest and explain services. Family is agreeable to transfer today. CSW aware. Registration paperwork completed. Dr. Orpah Melter to assume care per family request. Please fax discharge summary to [phone]. RN please call report to [phone]. Please arrange transport for patient to arrive before noon if possible.  Please call with any hospice related questions or concerns.  Thank you,  Farrel Gordon, RN, [facility] Liaison [phone]  [facility] hospital liaisons are on [place]

## 2017-02-25 NOTE — Care Management Note (Signed)
Case Management Note  Patient Details  Name: CARLISE STOFER MRN: [id number] Date of Birth: Oct 03, 1933  Subjective/Objective: comfort care ,dc to [facility] today. CSW following.                   Action/Plan: DC to [facility] today.  Expected Discharge Date:  02/25/17               Expected Discharge Plan:  [place]  In-House Referral:  Clinical Social Work  Discharge planning Services  CM Consult  Post Acute Care Choice:    Choice offered to:     DME Arranged:    DME Agency:     HH Arranged:    [place] Agency:     Status of Service:  Completed, signed off  If discussed at [employer], dates discussed:    Additional Comments:  Zenon Mayo, RN 02/25/2017, 12:32 PM

## 2017-02-25 NOTE — Discharge Summary (Signed)
Physician Discharge Summary  Kendra Maldonado:[id number] DOB: Jan 28, 1933 DOA: 02/23/2017  PCP: Lavone Orn, MD  Admit date: 02/23/2017 Discharge date: 02/25/2017  Time spent: > 35 minutes  Recommendations for Outpatient Follow-up:  1. Ensure comfort measures at residential hospice   Discharge Diagnoses:  Active Problems:   Stroke (cerebrum) (HCC)   Acute right MCA stroke [facility])   Palliative care encounter   Goals of care, counseling/discussion   Encounter for hospice care discussion   Discharge Condition: stable  Diet recommendation: comfort feeds  Filed Weights   02/24/17 0153  Weight: 68.2 kg (150 lb 5.7 oz)    History of present illness:  The patient is a 84 y.o.femalewith past medical history of cerebral vascular disease high-grade right posterior inferior cerebral artery stenosis on MRA 3-17, history of stroke, persistent atrial fibrillation, stage III kidney disease, diabetes, history of spinal stenosis, back pain, symptomatic anemia with upper GI bleedadmitted on 2/3/2019with right MCA stroke with left hemiparesis and right word gaze.   Hospital Course:  Agree with Palliative as documented in the last note: Assessment: Acute right MCA stroke; comfort measures only per family request, accepted to residential hospice at [facility].  Recommendations/Plan:  Residential hospice, comfort and dignity at end of life, [facility]  Goals of Care and Additional Recommendations: ? Limitations on Scope of Treatment: Full Comfort Care  Code Status:             Code Status Orders  (From admission, onward)              Start     Ordered   02/24/17 0003  Do not attempt resuscitation (DNR)  Continuous    Question Answer Comment  In the event of cardiac or respiratory ARREST Do not call a "code blue"   In the event of cardiac or respiratory ARREST Do not perform Intubation, CPR, defibrillation or ACLS   In the event of cardiac or respiratory ARREST  Use medication by any route, position, wound care, and other measures to relive pain and suffering. May use oxygen, suction and manual treatment of airway obstruction as needed for comfort.      02/24/17 0002                      Code Status History    Date Active Date Inactive Code Status Order ID Comments User Context   02/15/2017 00:36 02/20/2017 01:22 Full Code [id number]  Jani Gravel, MD Inpatient   12/31/2016 13:22 01/09/2017 16:48 DNR [id number]  Donne Hazel, MD ED   12/31/2016 13:20 12/31/2016 13:22 Full Code [id number]  Donne Hazel, MD ED   04/04/2015 06:53 04/06/2015 16:43 Full Code [id number]  [name], MD ED   12/09/2013 20:32 12/11/2013 19:21 Full Code [id number]  [name], MD Inpatient    ?   Prognosis:   < 2 weeks would not be surprising based on frailty, functional status, severity of stroke, decreased by mouth intake over the last 2 months  Discharge Planning:  [facility] residential hospice for comfort and dignity at end of life.   Procedures:  none  Consultations:  Neurology  Palliative  Discharge Exam: Vitals:   02/25/17 0331 02/25/17 0718  BP: (!) 141/69 (!) 154/98  Pulse: 75 (!) 54  Resp: 17 (!) 28  Temp: 98.3 F (36.8 C) 99.1 F (37.3 C)  SpO2: 96% 97%    General: Pt in nad, alert and awake Cardiovascular: no cyanosis Respiratory: No increased  wob, no wheezes  Discharge Instructions   Discharge Instructions    Call MD for:  severe uncontrolled pain   Complete by:  As directed    Call MD for:  temperature >100.4   Complete by:  As directed    Diet - low sodium heart healthy   Complete by:  As directed    Discharge instructions   Complete by:  As directed    Continue comfort care measures and adjust medications accordingly.   Increase activity slowly   Complete by:  As directed      Allergies as of 02/25/2017      Reactions   Tramadol Other (See Comments)   dizziness, "WENT OUT"   Codeine  Rash   Demerol [meperidine] Rash      Medication List    STOP taking these medications   atorvastatin 40 MG tablet Commonly known as:  LIPITOR   D 2000 2000 units Tabs Generic drug:  Cholecalciferol   metoprolol tartrate 50 MG tablet Commonly known as:  LOPRESSOR   mirtazapine 15 MG disintegrating tablet Commonly known as:  REMERON SOL-TAB   mirtazapine 15 MG tablet Commonly known as:  REMERON   omeprazole 20 MG capsule Commonly known as:  PRILOSEC   Potassium Chloride ER 20 MEQ Tbcr   QUEtiapine 25 MG tablet Commonly known as:  SEROQUEL   Rivaroxaban 15 MG Tabs tablet Commonly known as:  XARELTO     TAKE these medications   LORazepam 1 MG tablet Commonly known as:  ATIVAN Place 1 tablet (1 mg total) under the tongue every 6 (six) hours as needed for anxiety or seizure.      Allergies  Allergen Reactions  . Tramadol Other (See Comments)    dizziness, "WENT OUT"  . Codeine Rash  . Demerol [Meperidine] Rash      The results of significant diagnostics from this hospitalization (including imaging, microbiology, ancillary and laboratory) are listed below for reference.    Significant Diagnostic Studies: Ct Angio Head W Or Wo Contrast  Result Date: 02/14/2017 CLINICAL DATA:  Aphasia. EXAM: CT ANGIOGRAPHY HEAD AND NECK CT PERFUSION BRAIN TECHNIQUE: Multidetector CT imaging of the head and neck was performed using the standard protocol during bolus administration of intravenous contrast. Multiplanar CT image reconstructions and MIPs were obtained to evaluate the vascular anatomy. Carotid stenosis measurements (when applicable) are obtained utilizing NASCET criteria, using the distal internal carotid diameter as the denominator. Multiphase CT imaging of the brain was performed following IV bolus contrast injection. Subsequent parametric perfusion maps were calculated using RAPID software. CONTRAST:  28mL ISOVUE-370 IOPAMIDOL (ISOVUE-370) INJECTION 76% COMPARISON:  Head  CT from earlier today. Brain MRI and MRA 04/04/2015 FINDINGS: CTA NECK FINDINGS Aortic arch: Atherosclerotic plaque. No acute finding. Three vessel branching. Right carotid system: Mild atheromatous changes at the ICA bulb. No stenosis, ulceration, or dissection. Left carotid system: Mild atheromatous changes at the common carotid bifurcation. No stenosis, ulceration, or dissection. Vertebral arteries: There is no proximal subclavian flow limiting stenosis. Mild right vertebral artery dominance. Both vertebral arteries are smooth and widely patent to the dura. Skeleton: No acute or aggressive finding. Other neck: No evidence of mass or inflammation. Upper chest: Negative Review of the MIP images confirms the above findings CTA HEAD FINDINGS Anterior circulation: Atherosclerotic calcification on the carotid siphons. No stenosis or branch occlusion. Negative for aneurysm. Posterior circulation: Right vertebral artery dominance. The vertebrobasilar arteries are smooth and widely patent. Robust and symmetric bilateral PCA flow. Bilateral diminutive picas,  especially proximally, a notable change since 2017 MRA. Venous sinuses: Patent Anatomic variants: None significant Delayed phase: Not performed in the emergent setting Review of the MIP images confirms the above findings CT Brain Perfusion Findings: CBF (<30%) Volume: 67mL Perfusion (Tmax>6.0s) volume: 42mL Head T-max threshold of greater than 4 seconds there is an area of delayed flow in the lateral left frontal region. IMPRESSION: 1. No emergent large vessel occlusion.  No infarct by CT perfusion. 2. Overall mild atherosclerosis in the head neck. No correctable flow limiting stenosis. 3. Small area of delayed perfusion in the lateral left frontal lobe, possibly reflecting a small embolism not resolved by CTA. 4. Bilateral inferior cerebellar infarcts with a chronic appearance. The left-sided infarct and bilateral poor pica flow has developed since 04/04/2015.  Electronically Signed   By: Monte Fantasia M.D.   On: 02/14/2017 16:00   Dg Chest 1 View  Result Date: 02/24/2017 CLINICAL DATA:  Acute onset of altered mental status. Status post CVA. EXAM: CHEST 1 VIEW COMPARISON:  Chest radiograph performed 02/14/2017 FINDINGS: The lungs are well-aerated. Mild vascular congestion is noted. Mild retrocardiac opacity could reflect atelectasis or possibly mild infection. There is no evidence of pleural effusion or pneumothorax. The cardiomediastinal silhouette is borderline normal in size. No acute osseous abnormalities are seen. Clips are noted overlying the left axilla. IMPRESSION: Mild vascular congestion. Mild retrocardiac opacity may reflect atelectasis or possibly mild infection. Electronically Signed   By: Garald Balding M.D.   On: 02/24/2017 00:39   Dg Chest 2 View  Result Date: 02/14/2017 CLINICAL DATA:  Atrial fibrillation and diabetes. Evaluate for pneumonia. Nonsmoker. EXAM: CHEST  2 VIEW COMPARISON:  01/03/2017 FINDINGS: Cardiomegaly with aortic atherosclerosis. Mild pulmonary hyperinflation. No pulmonary consolidations nor overt pulmonary edema. There is minimal atelectasis at the left lung base. Clearing of previous pleural effusions. Axillary clips are seen on the left. No acute nor suspicious osseous abnormalities. IMPRESSION: 1. The lungs are mildly hyperinflated. No alveolar consolidations. Left basilar atelectasis is noted. 2. Aortic atherosclerosis. 3. Stable cardiomegaly. Electronically Signed   By: Ashley Royalty M.D.   On: 02/14/2017 16:44   Ct Angio Neck W Or Wo Contrast  Result Date: 02/14/2017 CLINICAL DATA:  Aphasia. EXAM: CT ANGIOGRAPHY HEAD AND NECK CT PERFUSION BRAIN TECHNIQUE: Multidetector CT imaging of the head and neck was performed using the standard protocol during bolus administration of intravenous contrast. Multiplanar CT image reconstructions and MIPs were obtained to evaluate the vascular anatomy. Carotid stenosis measurements (when  applicable) are obtained utilizing NASCET criteria, using the distal internal carotid diameter as the denominator. Multiphase CT imaging of the brain was performed following IV bolus contrast injection. Subsequent parametric perfusion maps were calculated using RAPID software. CONTRAST:  42mL ISOVUE-370 IOPAMIDOL (ISOVUE-370) INJECTION 76% COMPARISON:  Head CT from earlier today. Brain MRI and MRA 04/04/2015 FINDINGS: CTA NECK FINDINGS Aortic arch: Atherosclerotic plaque. No acute finding. Three vessel branching. Right carotid system: Mild atheromatous changes at the ICA bulb. No stenosis, ulceration, or dissection. Left carotid system: Mild atheromatous changes at the common carotid bifurcation. No stenosis, ulceration, or dissection. Vertebral arteries: There is no proximal subclavian flow limiting stenosis. Mild right vertebral artery dominance. Both vertebral arteries are smooth and widely patent to the dura. Skeleton: No acute or aggressive finding. Other neck: No evidence of mass or inflammation. Upper chest: Negative Review of the MIP images confirms the above findings CTA HEAD FINDINGS Anterior circulation: Atherosclerotic calcification on the carotid siphons. No stenosis or branch occlusion. Negative  for aneurysm. Posterior circulation: Right vertebral artery dominance. The vertebrobasilar arteries are smooth and widely patent. Robust and symmetric bilateral PCA flow. Bilateral diminutive picas, especially proximally, a notable change since 2017 MRA. Venous sinuses: Patent Anatomic variants: None significant Delayed phase: Not performed in the emergent setting Review of the MIP images confirms the above findings CT Brain Perfusion Findings: CBF (<30%) Volume: 46mL Perfusion (Tmax>6.0s) volume: 61mL Head T-max threshold of greater than 4 seconds there is an area of delayed flow in the lateral left frontal region. IMPRESSION: 1. No emergent large vessel occlusion.  No infarct by CT perfusion. 2. Overall mild  atherosclerosis in the head neck. No correctable flow limiting stenosis. 3. Small area of delayed perfusion in the lateral left frontal lobe, possibly reflecting a small embolism not resolved by CTA. 4. Bilateral inferior cerebellar infarcts with a chronic appearance. The left-sided infarct and bilateral poor pica flow has developed since 04/04/2015. Electronically Signed   By: Monte Fantasia M.D.   On: 02/14/2017 16:00   Ct Cervical Spine Wo Contrast  Result Date: 02/23/2017 CLINICAL DATA:  Patient slipped out of wheelchair and struck head. No neck pain is reported. EXAM: CT CERVICAL SPINE WITHOUT CONTRAST TECHNIQUE: Multidetector CT imaging of the cervical spine was performed without intravenous contrast. Multiplanar CT image reconstructions were also generated. COMPARISON:  CT head reported separately. FINDINGS: Multiple repeats were obtained, but despite best efforts of the technologist, there is significant motion degradation. The study is of marginal diagnostic utility. Alignment: Anatomic. Skull base and vertebrae: Motion degraded, no definite fracture. Soft tissues and spinal canal: No prevertebral fluid or swelling. Assessment for canal hematoma not possible. Disc levels: Multilevel disc space narrowing, most pronounced at C4-5 and C5-6. Upper chest: No pneumothorax or visible mass. Other: None. IMPRESSION: Motion degraded examination of marginal diagnostic utility. No gross cervical spine fracture or traumatic subluxation. Electronically Signed   By: Staci Righter M.D.   On: 02/23/2017 18:16   Mr Brain Wo Contrast  Result Date: 02/15/2017 CLINICAL DATA:  Altered level of consciousness.  Receptive aphasia. EXAM: MRI HEAD WITHOUT CONTRAST TECHNIQUE: Multiplanar, multiecho pulse sequences of the brain and surrounding structures were obtained without intravenous contrast. COMPARISON:  CTA and cerebral perfusion from yesterday FINDINGS: Brain: In the area of previously noted delayed perfusion is a  lateral left frontal infarct measuring up to 2.6 cm, cortical and white matter. No acute infarct in alternate distribution. No hemorrhagic conversion. Extensive bilateral inferior cerebellar infarction that has progressed on the left when compared to 04/04/2015 brain MRI. There is T1 hyperintensity within the left cerebellar infarct distribution consistent with cortical laminar necrosis or nonacute blood products. Moderate chronic small vessel ischemic type change in the cerebral white matter. No hydrocephalus or masslike finding. Vascular: Major flow voids are preserved. Skull and upper cervical spine: Negative for marrow lesion Sinuses/Orbits: Right cataract resection. Small left maxillary mucous retention cysts. Mild bilateral mastoid opacification. IMPRESSION: 1. 2.6 cm acute infarct in the lateral left frontal lobe. This correlates with the area of delayed perfusion on CTP yesterday. 2. Large nonacute infarcts of the bilateral inferior cerebellum, progressed on the left since 2017 comparison. Electronically Signed   By: Monte Fantasia M.D.   On: 02/15/2017 13:05   Ct Cerebral Perfusion W Contrast  Result Date: 02/14/2017 CLINICAL DATA:  Aphasia. EXAM: CT ANGIOGRAPHY HEAD AND NECK CT PERFUSION BRAIN TECHNIQUE: Multidetector CT imaging of the head and neck was performed using the standard protocol during bolus administration of intravenous contrast. Multiplanar CT  image reconstructions and MIPs were obtained to evaluate the vascular anatomy. Carotid stenosis measurements (when applicable) are obtained utilizing NASCET criteria, using the distal internal carotid diameter as the denominator. Multiphase CT imaging of the brain was performed following IV bolus contrast injection. Subsequent parametric perfusion maps were calculated using RAPID software. CONTRAST:  74mL ISOVUE-370 IOPAMIDOL (ISOVUE-370) INJECTION 76% COMPARISON:  Head CT from earlier today. Brain MRI and MRA 04/04/2015 FINDINGS: CTA NECK FINDINGS  Aortic arch: Atherosclerotic plaque. No acute finding. Three vessel branching. Right carotid system: Mild atheromatous changes at the ICA bulb. No stenosis, ulceration, or dissection. Left carotid system: Mild atheromatous changes at the common carotid bifurcation. No stenosis, ulceration, or dissection. Vertebral arteries: There is no proximal subclavian flow limiting stenosis. Mild right vertebral artery dominance. Both vertebral arteries are smooth and widely patent to the dura. Skeleton: No acute or aggressive finding. Other neck: No evidence of mass or inflammation. Upper chest: Negative Review of the MIP images confirms the above findings CTA HEAD FINDINGS Anterior circulation: Atherosclerotic calcification on the carotid siphons. No stenosis or branch occlusion. Negative for aneurysm. Posterior circulation: Right vertebral artery dominance. The vertebrobasilar arteries are smooth and widely patent. Robust and symmetric bilateral PCA flow. Bilateral diminutive picas, especially proximally, a notable change since 2017 MRA. Venous sinuses: Patent Anatomic variants: None significant Delayed phase: Not performed in the emergent setting Review of the MIP images confirms the above findings CT Brain Perfusion Findings: CBF (<30%) Volume: 16mL Perfusion (Tmax>6.0s) volume: 22mL Head T-max threshold of greater than 4 seconds there is an area of delayed flow in the lateral left frontal region. IMPRESSION: 1. No emergent large vessel occlusion.  No infarct by CT perfusion. 2. Overall mild atherosclerosis in the head neck. No correctable flow limiting stenosis. 3. Small area of delayed perfusion in the lateral left frontal lobe, possibly reflecting a small embolism not resolved by CTA. 4. Bilateral inferior cerebellar infarcts with a chronic appearance. The left-sided infarct and bilateral poor pica flow has developed since 04/04/2015. Electronically Signed   By: Monte Fantasia M.D.   On: 02/14/2017 16:00   Ct Head Code  Stroke Wo Contrast  Result Date: 02/23/2017 CLINICAL DATA:  Code stroke. LEFT-sided weakness. Patient slipped out of wheelchair and struck head EXAM: CT HEAD WITHOUT CONTRAST TECHNIQUE: Contiguous axial images were obtained from the base of the skull through the vertex without intravenous contrast. COMPARISON:  CT head 02/14/2017. MR brain 02/15/2017. CT cervical spine reported separately. FINDINGS: Brain: There is a subacute infarct affecting the LEFT frontal lobe without hemorrhage, identified on prior MR there is no acute stroke, acute hemorrhage, mass lesion, hydrocephalus, or extra-axial fluid there is advanced atrophy with extensive hypoattenuation of the white matter. Chronic BILATERAL cerebellar infarcts are observed. Vascular: Calcification of the cavernous internal carotid arteries consistent with cerebrovascular atherosclerotic disease. No signs of intracranial large vessel occlusion. Skull: Normal. Negative for fracture or focal lesion. Sinuses/Orbits: No acute finding. Other: None. ASPECTS [facility] Stroke Program Early CT Score) - Ganglionic level infarction (caudate, lentiform nuclei, internal capsule, insula, M1-M3 cortex): 7 - Supraganglionic infarction (M4-M6 cortex): 3 (allowing for previously identified infarct). Total score (0-10 with 10 being normal): 10 IMPRESSION: 1. Atrophy and small vessel disease. Subacute LEFT frontal infarct is nonhemorrhagic. No acute intracranial findings. No posttraumatic sequelae are evident. 2. ASPECTS is 10. These results were communicated to [facility] 2/3/2019by text page via the [facility] messaging system. Electronically Signed   By: Staci Righter M.D.   On: 02/23/2017  18:13   Ct Head Code Stroke Wo Contrast  Result Date: 02/14/2017 CLINICAL DATA:  Code stroke.  Right facial droop. EXAM: CT HEAD WITHOUT CONTRAST TECHNIQUE: Contiguous axial images were obtained from the base of the skull through the vertex without intravenous contrast. COMPARISON:  MRI  brain 04/03/2016 FINDINGS: Brain: Extensive periventricular and subcortical white matter changes are similar to the prior exam. Posterior inferior left cerebellar hypoattenuation is new since the prior exam. No other acute cortical infarct is evident. A remote right posterior inferior cerebellar infarct is stable. The insular ribbon is intact. Basal ganglia are stable. The ventricles are proportionate to the degree of atrophy. There is no significant extra-axial fluid collection. Vascular: There is mildly increased attenuation throughout the vascular system. No asymmetric hyperdense vessel is present. Vascular calcifications are present within the cavernous internal carotid arteries bilaterally. Skull: Calvarium is intact. No focal lytic or blastic lesions are present. Sinuses/Orbits: Polyp or mucous retention cyst is noted anteriorly in the left maxillary sinus. The remaining paranasal sinuses and mastoid air cells are clear. A right lens replacement is present. Globes and orbits are otherwise within normal limits. ASPECTS [facility] Stroke Program Early CT Score) - Ganglionic level infarction (caudate, lentiform nuclei, internal capsule, insula, M1-M3 cortex): 7/7 - Supraganglionic infarction (M4-M6 cortex): 3/3 Total score (0-10 with 10 being normal): 10/10 IMPRESSION: 1. Left posterior inferior cerebellar nonhemorrhagic infarct is new since the prior study and may be acute. 2. Remote right cerebellar infarct. 3. No acute supratentorial infarct visible. 4. Advanced atrophy and white matter disease is stable. 5. ASPECTS is 10/10 Electronically Signed   By: San Morelle M.D.   On: 02/14/2017 15:22    Microbiology: No results found for this or any previous visit (from the past 240 hour(s)).   Labs: Basic Metabolic Panel: Recent Labs  Lab 02/19/17 0907 02/23/17 1749 02/23/17 1936  NA 141 142 139  K 3.5 3.5 3.7  CL 105 103 104  CO2 20*  --  17*  GLUCOSE 86 132* 153*  BUN 15 23* 18  CREATININE  1.02* 1.20* 1.28*  CALCIUM 8.6*  --  9.2   Liver Function Tests: Recent Labs  Lab 02/23/17 1936  AST 18  ALT 11*  ALKPHOS 90  BILITOT 1.0  PROT 6.5  ALBUMIN 3.3*   No results for input(s): LIPASE, AMYLASE in the last 168 hours. No results for input(s): AMMONIA in the last 168 hours. CBC: Recent Labs  Lab 02/23/17 1749 02/23/17 1936  WBC  --  8.7  NEUTROABS  --  7.0  HGB 16.7* 14.4  HCT 49.0* 45.9  MCV  --  95.8  PLT  --  139*   Cardiac Enzymes: No results for input(s): CKTOTAL, CKMB, CKMBINDEX, TROPONINI in the last 168 hours. BNP: BNP (last 3 results) No results for input(s): BNP in the last 8760 hours.  ProBNP (last 3 results) No results for input(s): PROBNP in the last 8760 hours.  CBG: Recent Labs  Lab 02/19/17 1857 02/23/17 1733  GLUCAP 144* 126*       Signed:  Velvet Bathe MD.  [facility] 02/25/2017, 10:40 AM

## 2017-02-25 NOTE — Progress Notes (Signed)
Patient will discharge to [facility] Anticipated discharge date: 2/5 Family notified: at bedside Transportation by [facility]- called at 11:50am  CSW signing off.  Jorge Ny, LCSW Clinical Social Worker [phone]

## 2017-03-21 DEATH — deceased

## 2017-04-01 ENCOUNTER — Telehealth: Payer: Self-pay | Admitting: Neurology

## 2017-04-01 NOTE — Telephone Encounter (Signed)
[facility] hospice facility and was informed patient passed away on Apr 01, 2017.

## 2017-04-03 ENCOUNTER — Ambulatory Visit: Payer: Self-pay | Admitting: Neurology

## 2018-06-26 IMAGING — RF DG ESOPHAGUS
10 of 11 series · 14 of 16 positions shown · non-contrast
Comparison: None.

CLINICAL DATA: 83-year-old female with dysphagia. Initial
encounter.

EXAM:
ESOPHOGRAM/BARIUM SWALLOW
TECHNIQUE: Single contrast examination was performed using thin barium.
FLUOROSCOPY TIME:  Fluoroscopy Time:  48 seconds
Radiation Exposure Index:  6 mGy

[Series 1: cp_standard · 0.27mm/px · 1 of 1 slices shown (1 of 10)]
[im 1/1]
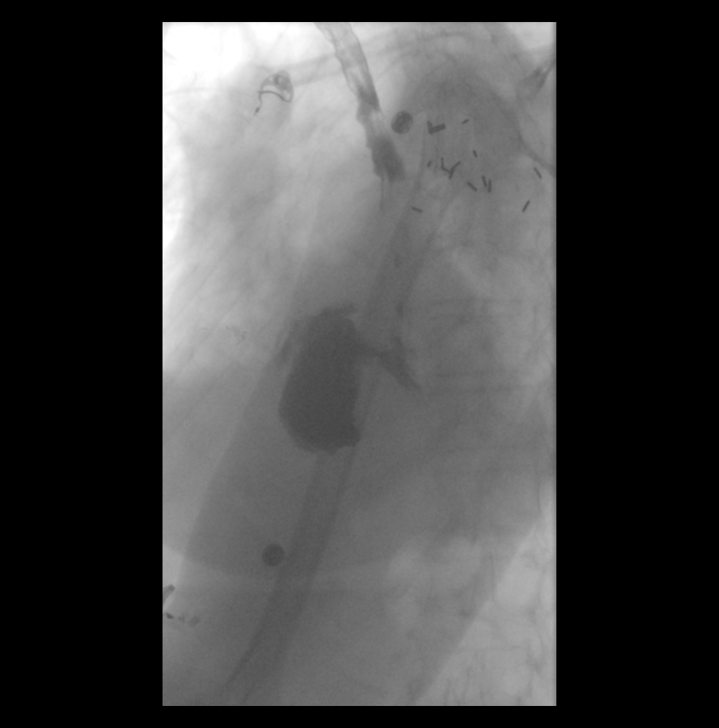

[Series 2: cp_standard · 0.54mm/px · 2 of 39 frames shown (2 of 10)]
[frame 6/39]
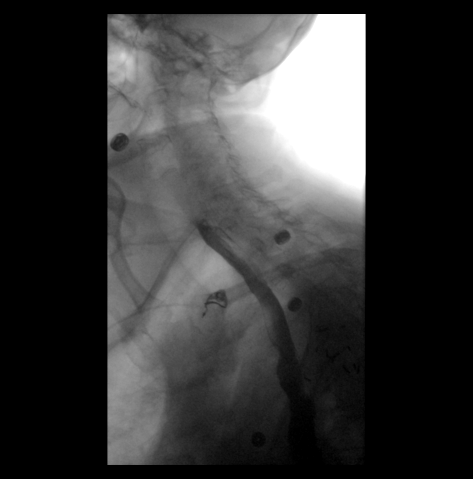
[frame 20/39]
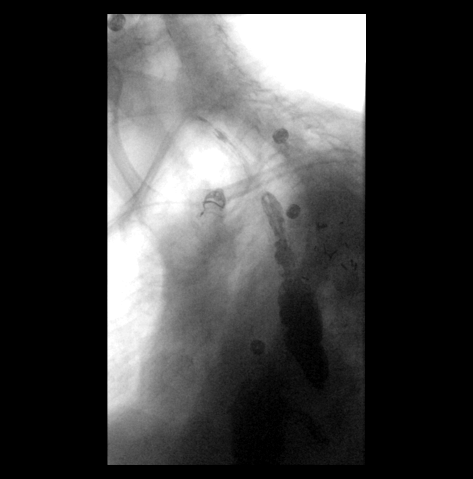

[Series 3: cp_standard · 0.27mm/px · 1 of 1 slices shown (3 of 10)]
[im 1/1]
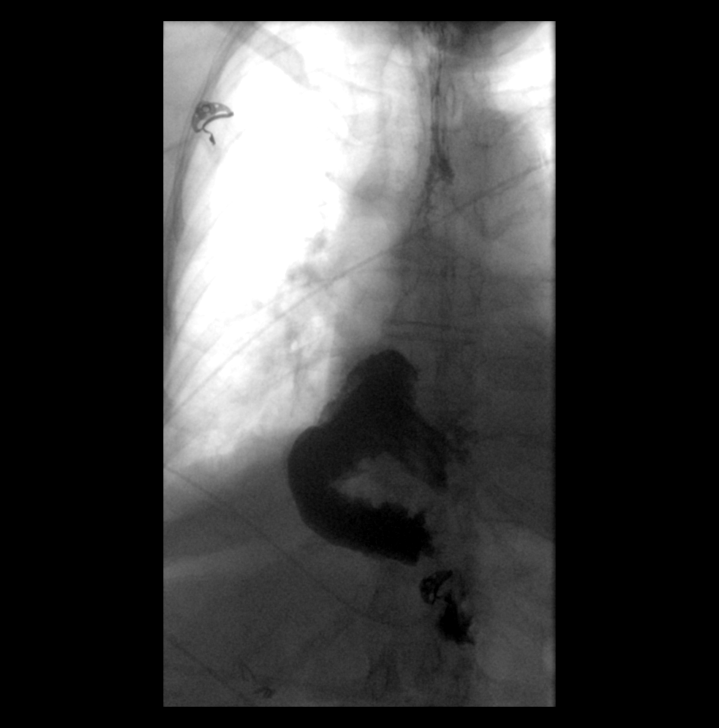

[Series 4: cp_standard · 0.55mm/px · 4 of 50 frames shown (4 of 10)]
[frame 6/50]
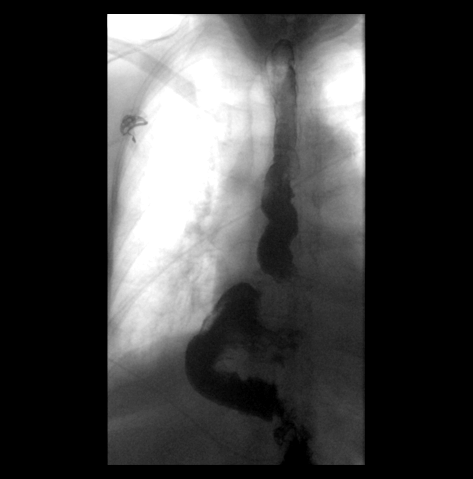
[frame 8/50]
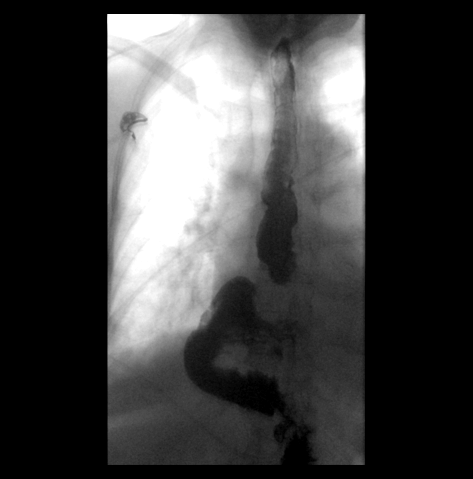
[frame 26/50]
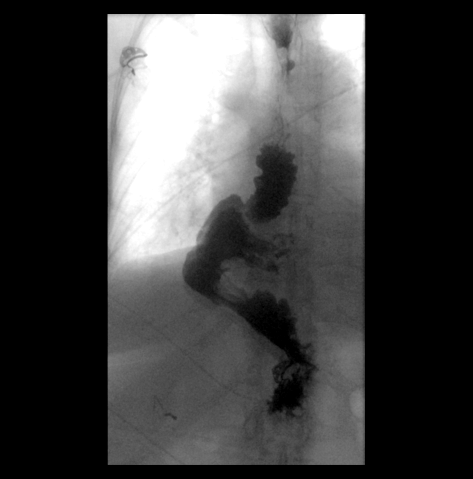
[frame 43/50]
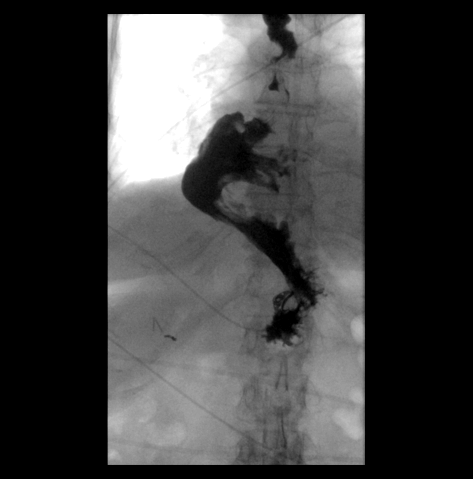

[Series 5: cp_standard · 0.27mm/px · 1 of 1 slices shown (5 of 10)]
[im 1/1]
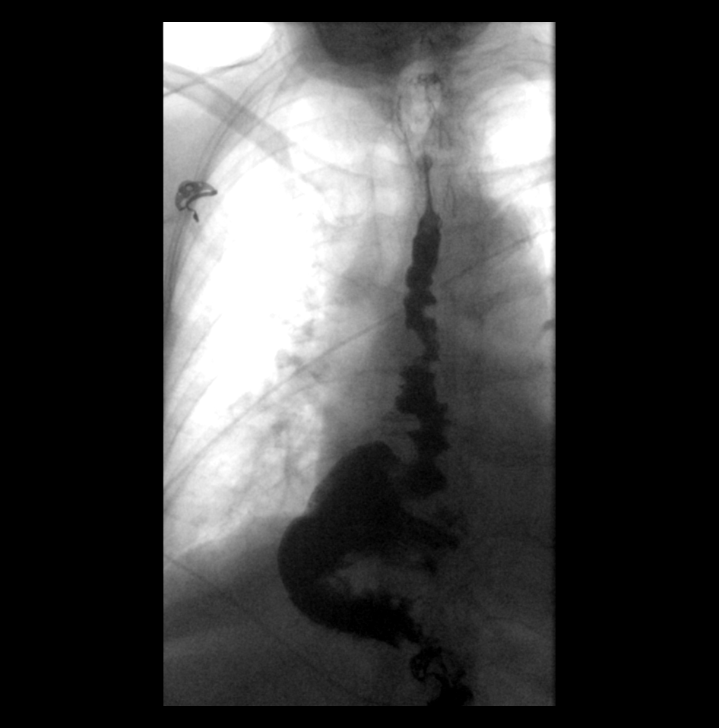

[Series 6: cp_standard · 0.27mm/px · 1 of 1 slices shown (6 of 10)]
[im 1/1]
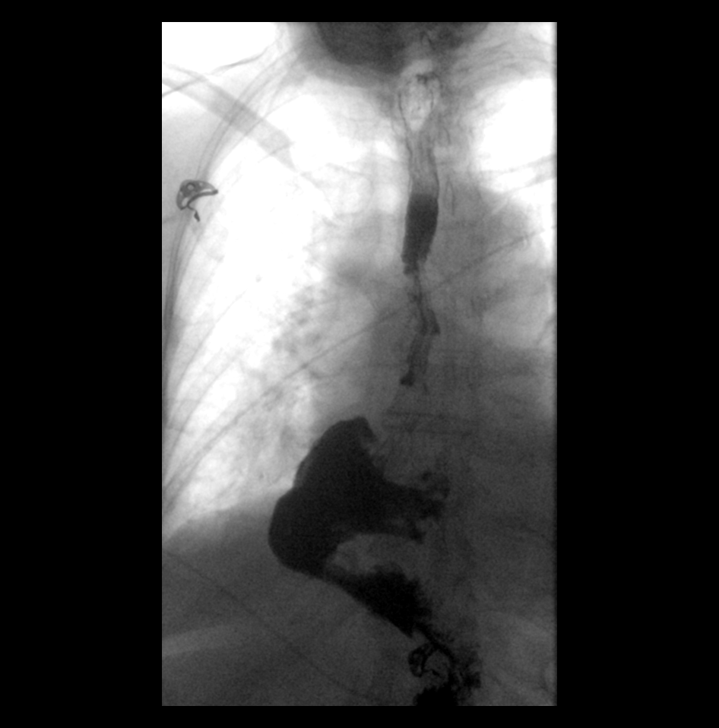

[Series 8: cp_standard · 0.28mm/px · 1 of 1 slices shown (7 of 10)]
[im 1/1]
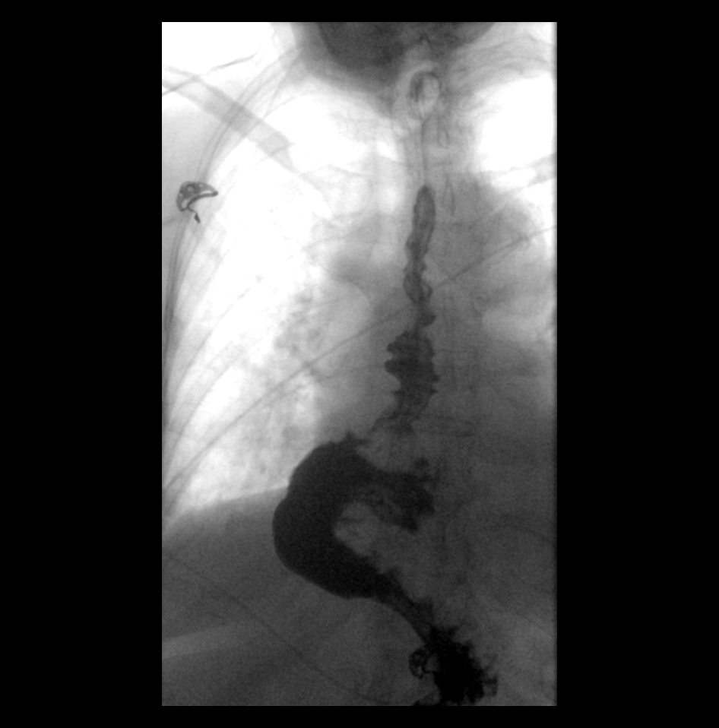

[Series 9: cp_standard · 0.28mm/px · 1 of 1 slices shown (8 of 10)]
[im 1/1]
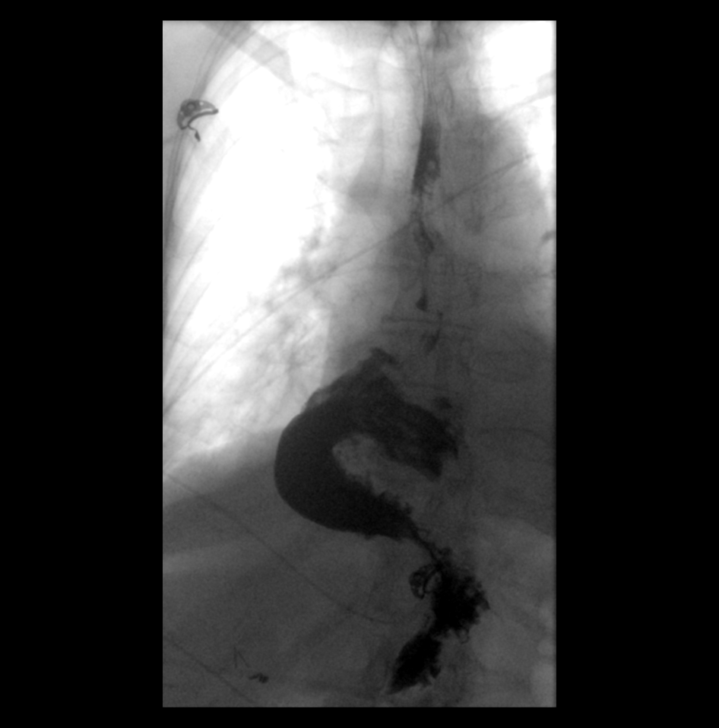

[Series 10: cp_standard · 0.28mm/px · 1 of 1 slices shown (9 of 10)]
[im 1/1]
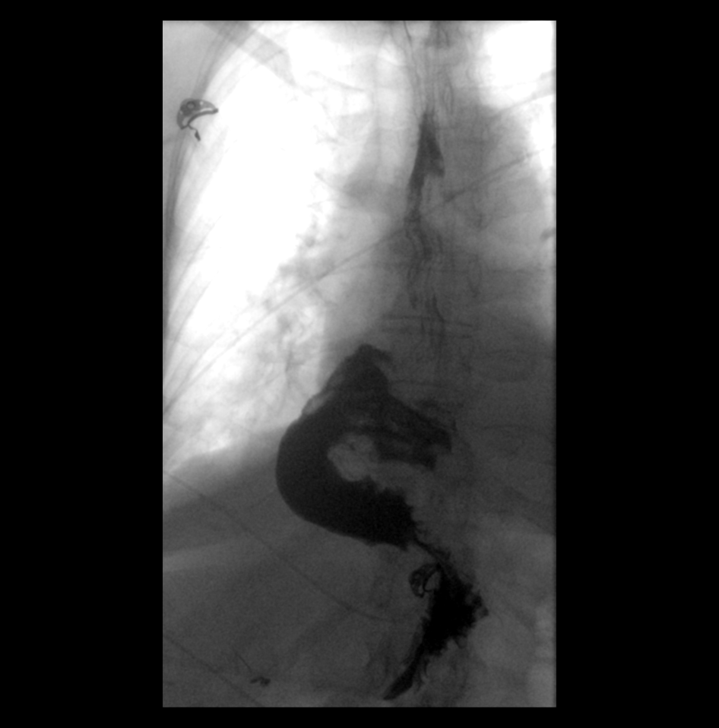

[Series 11: cp_standard · 0.28mm/px · 1 of 1 slices shown (10 of 10)]
[im 1/1]
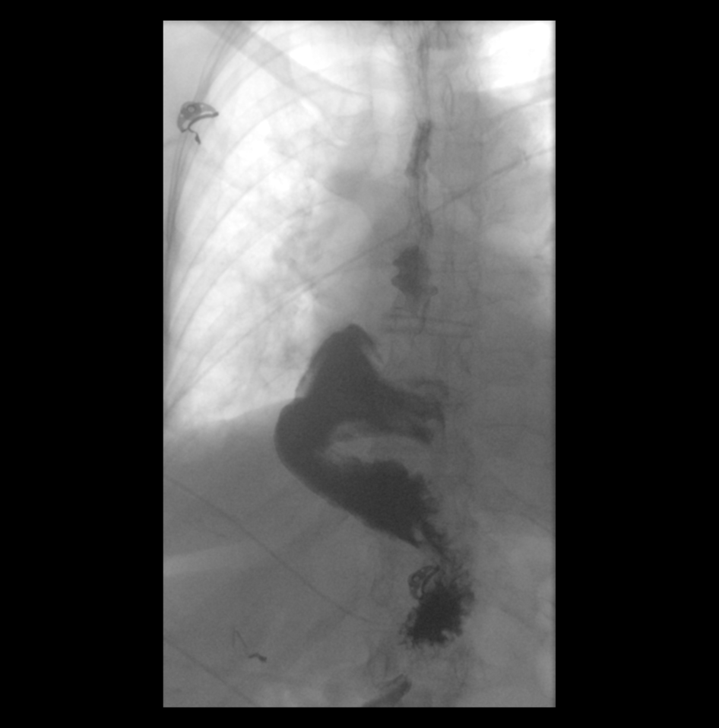

[14 of 16 positions shown; findings below may reference images not displayed]

FINDINGS: Present examination is limited by the fact patient was not able to
change position, ingest any significant amount of barium or ingest a
barium tablet.

Marked presbyesophagus.

Moderate-size hiatal hernia.

No significant obstructing lesion detected on this limited exam.

No aspiration noted during exam.
IMPRESSION: Limited exam.

Marked presbyesophagus.

Moderate-size hiatal hernia.
